# Patient Record
Sex: Male | Born: 1975
Health system: Southern US, Community
[De-identification: ages and names within clinical notes are randomized; demographics above are authoritative.]

## PROBLEM LIST (undated history)

## (undated) DIAGNOSIS — I10 Essential (primary) hypertension: Secondary | ICD-10-CM

## (undated) DIAGNOSIS — E119 Type 2 diabetes mellitus without complications: Secondary | ICD-10-CM

## (undated) DIAGNOSIS — E785 Hyperlipidemia, unspecified: Secondary | ICD-10-CM

## (undated) HISTORY — DX: Type 2 diabetes mellitus without complications: E11.9

## (undated) HISTORY — PX: APPENDECTOMY: SHX54

---

## 2000-08-27 ENCOUNTER — Encounter: Payer: Self-pay | Admitting: Emergency Medicine

## 2000-08-27 ENCOUNTER — Emergency Department (HOSPITAL_COMMUNITY): Admission: EM | Admit: 2000-08-27 | Discharge: 2000-08-27 | Payer: Self-pay | Admitting: Emergency Medicine

## 2004-08-17 ENCOUNTER — Ambulatory Visit: Payer: Self-pay | Admitting: Family Medicine

## 2004-08-31 ENCOUNTER — Ambulatory Visit: Payer: Self-pay | Admitting: Family Medicine

## 2004-11-14 ENCOUNTER — Emergency Department (HOSPITAL_COMMUNITY): Admission: EM | Admit: 2004-11-14 | Discharge: 2004-11-14 | Payer: Self-pay | Admitting: Family Medicine

## 2008-02-20 ENCOUNTER — Ambulatory Visit (HOSPITAL_COMMUNITY): Admission: EM | Admit: 2008-02-20 | Discharge: 2008-02-25 | Payer: Self-pay | Admitting: Emergency Medicine

## 2008-02-20 ENCOUNTER — Ambulatory Visit: Payer: Self-pay | Admitting: Internal Medicine

## 2008-02-20 ENCOUNTER — Encounter (INDEPENDENT_AMBULATORY_CARE_PROVIDER_SITE_OTHER): Payer: Self-pay | Admitting: General Surgery

## 2008-02-29 ENCOUNTER — Inpatient Hospital Stay (HOSPITAL_COMMUNITY): Admission: EM | Admit: 2008-02-29 | Discharge: 2008-03-07 | Payer: Self-pay | Admitting: Emergency Medicine

## 2010-02-24 ENCOUNTER — Emergency Department (HOSPITAL_COMMUNITY): Admission: EM | Admit: 2010-02-24 | Discharge: 2010-02-24 | Payer: Self-pay | Admitting: Emergency Medicine

## 2011-02-08 NOTE — H&P (Signed)
NAMEKyriakos, Babler Braylan               ACCOUNT NO.:  [id number]   MEDICAL RECORD NO.:  [id number]          PATIENT TYPE:  INP   LOCATION:  5120                         FACILITY:  [facility]   PHYSICIAN:  Angelia Mould. Derrell Lolling, M.D.DATE OF BIRTH:  1976-05-16   DATE OF ADMISSION:  02/29/2008  DATE OF DISCHARGE:                              HISTORY & PHYSICAL   OPERATIVE PHYSICIAN:  Ollen Gross. Vernell Morgans, MD   CHIEF COMPLAINT:  Back pain and rectal pain.   HISTORY OF PRESENT ILLNESS:  Mr. Smisek is a 35 year old male patient  status post appendectomy for perforated appendicitis on Feb 19, 2008.  He was recently discharged this past Monday on February 25, 2008.  His  postoperative course was complicated by nausea, vomiting, and ileus  symptoms.  On the day of discharge, the patient that he had symptoms  consistent with mild fecal urgency, but was not severe, but after  arriving home within 24 hours he began having increasing of rectal  pressure which evolved into back pain and right lower quadrant pain that  became progressive overtime.  He will have persistent fecal urgency and  despite the attempts to have a bowel movement, would only pass flatus.  As the pain increased, he opted to present to the ER and presented  during the night where workup revealed a leukocytosis of 27,000 with a  left shift, significant pain in the right lower quadrant and in the  back.  CT scan was completed that demonstrates phlegmon at the ileocecal  region, a large abscess between the bladder and the rectum, as well as a  satellite lesion extending out into laterally on the right from this  larger abscess.  Surgical admission has been requested.   REVIEW OF SYSTEMS:  As per the history present illness.  The patient  reports since discharge, he has been unable to sleep because of severe  pain despite the use of pain medicines.  He has had very poor intake and  small p.o. intake.  He is eating things such as small amounts of  pudding  and applesauce.  He has had no nausea and vomiting though.   PAST MEDICAL HISTORY:  1. Diabetes.  2. Hypertension.  3. Obesity.   PAST SURGICAL HISTORY:  Laparoscopic appendectomy as noted.   FAMILY HISTORY:  Noncontributory at this admission.   SOCIAL HISTORY:  He is married.  His wife is at bedside with him.  He  does not use tobacco products.  He uses alcohol on a rare social basis  only.   DRUG ALLERGIES:  NKDA.   CURRENT MEDICATIONS:  1. Cipro 500 mg b.i.d.  2. Flagyl 500 mg t.i.d.  3. Glyburide 2.5 mg 1/2 tablet daily.  4. Vicodin 5/325 mg 1-2 every 4-6 hours as need for pain.  5. Lisinopril 10 mg daily.  6. Metformin 5 mg b.i.d.  7. Simvastatin 40 mg daily.   PHYSICAL EXAMINATION:  GENERAL:  A pleasant male patient reporting  severe back pain and rectal pain causing inability to sleep.  A pleasant  male patient appearing stated age, in no acute distress  VITAL SIGNS:  Temperature 97.5; BP is 133/78; pulse was 114 upon  presentation, down to 88 after pain medications; respirations 20.  HEENT:  Head is normocephalic.  Sclerae noninjected.  NECK:  Supple.  No adenopathy.  CHEST:  Bilateral lung sounds, clear to auscultation.  Respiratory  effort is nonlabored.  He is on room air.  CARDIOVASCULAR:  Heart sounds are S1 and S2 without rubs, murmurs, or  gallops.  Prior tachycardia is resolved.  No JVD.  No peripheral edema.  ABDOMEN:  Obese, but soft.  He is tender in the right lower quadrant  without guarding or rebounding.  Minimal flank pain.  Bowel sounds are  present.  Prior surgical incisions from laparoscopic procedure are  healing well.  The Dermabond over the skin is crackling.  GENITOURINARY:  External genitalia is grossly normal.  Scrotal sac is  normal without any evidence of edema.  EXTREMITIES:  Symmetrical in appearance without cyanosis or clubbing.  SKIN:  As noted on abdominal exam regarding surgical incisions.  NEUROLOGIC:  The patient is  moving all extremities x4.  Cranial nerves  II-XII are grossly intact.  DTRs and Babinski reflexes were not checked.  Gait was not assessed.  PSYCH:  The patient is alert and oriented x4 and affect is appropriate  to current situation.   LAB AND X-RAYS:  Sodium 133, potassium 3.0, glucose 295, BUN 5,  creatinine 0.9, and CO2 of 25.  White count 27,200 with neutrophils 90%,  hemoglobin 14.2, and platelets 537,000.  Urinalysis is negative.  CT of  the abdomen and pelvis again demonstrates a phlegmonous process at the  ileocecal region.  He has a 7.1 x 7.7-cm abscess between the bladder and  the rectum.  He has a small satellite lesion anterior on the right to  this larger abscess.   IMPRESSION:  1. Postoperative pelvic abscesses.  2. Recent perforated appendicitis, status post laparoscopic      appendectomy.  3. Diabetes, uncontrolled with associated electrolyte imbalances.  4. Volume depletion.   PLAN:  1. Admit the patient to general surgical floor inpatient status.  2. Empirical Invanz.  Stop Cipro and Flagyl.  3. Consult interventional radiology for percutaneous drainage of      abscesses.  We would also like aspiration and culture as well of      the phlegmonous process in the right lower quadrant.  There were no      complications in regarding control of the appendiceal stump per Dr.      Billey Chang note, but giving we will need to keep an eye on this to make      sure there is no fistula or anastomotic leak.  We will treat      symptoms with IV Dilaudid for pain and Percocet when taking oral      medications.  We will go ahead and give Toradol and give PPI.  The      patient did have hematemesis, coffee ground-type emesis during the      last admission, but this was due to trauma from repeated emesis      episodes.  If these symptoms will occur, we will have to stop the      Toradol.  4. IV fluid hydration with normal saline and potassium.  The patient's      sodium and  potassium are abnormal, but this is because of      hyperglycemia.  The patient can take anything by mouth after he is  evaluated interventional radiology.  We will give him repletion of      oral potassium 40 mEq x2 doses.  5. Regarding the patient's diabetes, we will hold his metformin.      Since he has had CT contrast, we will go ahead and place him on the      glycemic control standing orders.  Appropriate orders have been      checked, but he is still      on his weight and current diet status.  We will go ahead and check      a hemoglobin A1c as well.  6. After percutaneous procedure is completed, we will start the      patient on prophylactic Lovenox for DVT prophylaxis issues.      Allison L. Rennis Harding, N.P.      Angelia Mould. Derrell Lolling, M.D.  Electronically Signed    ALE/MEDQ  D:  02/29/2008  T:  02/29/2008  Job:  [id number]   cc:   Ollen Gross. Vernell Morgans, M.D.

## 2011-02-08 NOTE — Discharge Summary (Signed)
NAMERaman, Featherston Mahin               ACCOUNT NO.:  [id number]   MEDICAL RECORD NO.:  [id number]          PATIENT TYPE:  OIB   LOCATION:  5150                         FACILITY:  [facility]   PHYSICIAN:  Angelia Mould. Derrell Lolling, M.D.DATE OF BIRTH:  December 23, 1975   DATE OF ADMISSION:  02/19/2008  DATE OF DISCHARGE:  02/25/2008                               DISCHARGE SUMMARY   DISCHARGING PHYSICIAN:  Angelia Mould. Derrell Lolling, MD   SURGEON:  Ollen Gross. Vernell Morgans, MD   PROCEDURE:  Laparoscopic appendectomy done by Dr. Carolynne Edouard on Feb 20, 2008.   CONSULTANTS:  Valerie A. Felicity Coyer, MD with [facility].   REASON FOR ADMISSION:  Mr. Hinton was a 35 year old black male, who  presented to the emergency department with right lower quadrant  abdominal pain on Saturday.  His pain was associated with significant  nausea and vomiting.  At this time, he did not have any fevers; however,  his pain has been continuously worsening.  At this time, a CT scan was  done which showed acute appendicitis.  Please see admission history and  physical for further details.   ADMITTING DIAGNOSIS:  Acute appendicitis.   HOSPITAL COURSE:  The patient was admitted and immediately taken to the  OR for a laparoscopic appendectomy to be done.  While in the OR, the  patient was found to have a perforated appendix.  After surgery, the  patient went to PACU in stable condition.  After surgery, he was also  placed on Zosyn IV q.6 h.  On hospital day #1-1/2, the patient was  afebrile and his vital signs were stable except for he is slightly  tachycardiac at 125.  There were currently no labs at this time.  The  patient was complaining of some pain but was well controlled with his  medicines.  He also states that he drank some juice this morning,  however, that was all he could handle.  At this time, he was not passing  any flatus.  At this time, he was continued on clear liquids and  informed to began ambulating.  On postoperative day  #1, the patient was  continued to be afebrile; however, he was still tachycardiac and his  blood pressure had increased to 150/90.  At this time, his white blood  cell count had decreased from 20,000-18,800.  At this time, the patient  was not feeling well and was having persistent bilious emesis.  At this  time, Phenergan, Zofran, as well as Reglan did not seem to help his  nausea.  At this time, Compazine was ordered as well to help with his  nausea and vomiting.  Therefore at this time, the patient had NG tube  placed for which he received back approximately 1000 mL as soon as it  was placed.  Otherwise, a 2-view abdominal x-ray was obtained which  ended up showing a significant ileus.  The patient was also started on  Protonix.  By postoperative day #2, the patient was beginning to feel  somewhat better.  He was now starting to pass flatus; however, his  abdomen was still  slightly distended.  At this time, his incisions were  stable and clean, dry, and intact.  His NG tube was continued.  By  postoperative day #3, the patient was passing flatus and having multiple  bowel movements.  At this time, his NG tube was discontinued and he was  started on popsicles.  On postoperative day #4, the patient was  continued to feel better.  His abdomen was soft and continuing to have  loose bowel movements.  At this time, his diet was advanced to a soft  diet and his stools were sent to check for Clostridium difficile.  His  antibiotics were also continued at this time.  By postoperative day #5,  the patient was tolerating a soft diet well.  He was not complaining of  any pain.  His abdomen was completely benign and his incisions were  still clean, dry, and intact.  At this time, we do not have any of the  initial C. diff report; however, we feel that the patient is probably at  this time stable for discharge anyway.   On postoperative day #1-1/2, it was noted that the patient was beginning  to  have very high blood glucoses.  One was checked that was 272.  At  this time, orders were given to check CBG, CID a.c. and h.s. and then to  begin sliding scale insulin.  By postoperative day #1, the patient's  blood glucose was still elevated at 285, and he was beginning to have  quite a bit of hypertension at 150/90.  Therefore at this time, internal  medicine consult was asked for from [facility].  The patient had seen Dr.  Carolynne Edouard several years in the past.  At this time, they had written several  recommendations once the patient was beginning to take p.o. foods.  They  also requested  a nutrition consult to educate the patient on diabetic  diet and added h.s. coverage for his sliding scale insulin.  The patient  at this time was also started on a Catapres patch to help control his  blood pressure.  Internal medicine also made recommendations to start  the patient on lisinopril once he was taking p.o.'s as well.  His lipid  panel was also checked for which the patient was started on Zocor as  well.  Over the next several days, the patient's blood pressure was  somewhat better controlled and had come down to 130/78 on postoperative  day #3.  Throughout the rest of his hospital stay, his CBGs continued to  be elevated; however, quite a bit of education as well as multiple  prescriptions were written and close followup was already arranged for  the patient once he was discharged.   DISCHARGE DIAGNOSES:  1. Acute perforated appendicitis.  2. Status post laparoscopic appendectomy.  3. New-onset type 2 diabetes.  4. New-onset hypertension.  5. New-onset hyperlipidemia.   DISCHARGE MEDICATIONS:  1. Lisinopril 10 mg once daily.  2. Metformin 500 mg twice daily.  3. Simvastatin 40 mg once daily.  4. Glyburide 2.5 mg half a tablet twice daily.  5. Aspirin 81 mg once daily.  6. Cipro 500 mg b.i.d. for 5 days.  7. Flagyl 500 mg t.i.d. x5 days.  8. Vicodin 5/325 one tablet every 4-6 hours as  needed for pain.   DISCHARGE INSTRUCTIONS:  The patient was told that he may return to work  with in one week.  For the next week, he is not to lift anything  greater  than approximately 15 pounds; however, once he gets back to work he is  to let pain be his guide as far as how much he lifts.  Otherwise, he is  to increase his activity slowly and he may shower.  At this time, he is  to follow a diabetic diet which was explained to him by his nutritionist  in the hospital.  He is to call our office if he begins to get any  fevers greater than 101.5, new or severe abdominal pain, or redness or  pus-like drainage from his incisions.  Also from recommendations from  internal medicine, he is to make an appointment for an eye exam if he  was not done this within the last year.  Otherwise, he is to check his  blood sugars twice daily and record the time and the blood sugar number  in notebook.  He is informed that if his glucose is less than 80 that he  may take a glass of juice or a snack.  He is to repeat this in 30  minutes and greater than 350 then he is  to call Dr. Carolynne Edouard.  Otherwise, the patient is informed that he needs to  follow up with Dr. Carolynne Edouard on Friday, February 29, 2008, at 2:30 p.m..  He is  also to follow up with Dr. Carolynne Edouard in our office in 7-10 days, for which he  is to call for that appointment.      Letha Cape, PA      [place]. Derrell Lolling, M.D.  Electronically Signed    KEO/MEDQ  D:  02/25/2008  T:  02/25/2008  Job:  [id number]   cc:   Tinnie Gens A. Tawanna Cooler, MD  Ollen Gross. Vernell Morgans, M.D.

## 2011-02-08 NOTE — H&P (Signed)
NAMEKi, [name]               ACCOUNT NO.:  [id number]   MEDICAL RECORD NO.:  [id number]          PATIENT TYPE:  INP   LOCATION:  5128                         FACILITY:  [facility]   PHYSICIAN:  Ollen Gross. Vernell Morgans, M.D. DATE OF BIRTH:  December 10, 1975   DATE OF ADMISSION:  02/19/2008  DATE OF DISCHARGE:                              HISTORY & PHYSICAL   HISTORY OF PRESENT ILLNESS:  Mr. Dakota Becker is a 35 year old black male  who presents to the emergency department with right lower quadrant pain  since Saturday.  Pain has been associated with significant nausea and  vomiting.  He denies any fevers, pain has been worsening since Saturday  as well.  He otherwise denies any chest pain, shortness of breath,  diarrhea, dysuria, and the rest was reviewed.  Rest of his review of  systems is unremarkable.   PAST MEDICAL HISTORY:  None.   PAST SURGICAL HISTORY:  None.   MEDICATIONS:  None.   ALLERGIES:  None.   SOCIAL HISTORY:  He denies any tobacco use, only occasionally drinks an  alcohol.   FAMILY HISTORY:  Noncontributory.   PHYSICAL EXAMINATION:  VITALS:  Temperature 97.9, blood pressure 136/83,  and pulse 100.  GENERAL:  He is a well-developed, well-nourished black male, in no acute  distress.  SKIN:  Warm and dry without jaundice.  EYES:  Extraocular muscles were intact.  Pupils are equal, round and  reactive to light.  Sclerae nonicteric.  LUNGS:  Clear bilaterally.  No use of accessory muscles.  HEART:  Regular rate and rhythm with impulse in left chest.  ABDOMEN:  Soft, but significantly tender in the right lower quadrant  with guarding.  No palpable mass or hepatosplenomegaly.  EXTREMITIES:  No cyanosis, clubbing, or edema.  Good strength in his  arms and legs.  PSYCHOLOGIC:  He is alert and oriented x3 with no evidence of anxiety or  depression.   LABORATORY DATA:  On review of his lab work, which is significant for  white count of 20,000.  His CT scan was reviewed with the  radiologist  and did show an enlarged inflamed appendix consistent with appendicitis.   ASSESSMENT AND PLAN:  This is a 35 year old black male with what appears  to be acute appendicitis.  Because of the risk of perforation of the  sepsis, I think he would benefit from having his appendix removed.  I  have explained him in detail the risks and benefits of the operation to  remove the appendix as well as some of the technical aspects.  He  understands and wishes to proceed.  We will obtain some routine lab work  preparation during this one night.      Ollen Gross. Vernell Morgans, M.D.  Electronically Signed     PST/MEDQ  D:  02/19/2008  T:  02/20/2008  Job:  [id number]

## 2011-02-08 NOTE — Discharge Summary (Signed)
NAMENasser, Ku Harvard               ACCOUNT NO.:  [id number]   MEDICAL RECORD NO.:  [id number]          PATIENT TYPE:  INP   LOCATION:  5120                         FACILITY:  [facility]   PHYSICIAN:  Sandria Bales. Ezzard Standing, M.D.  DATE OF BIRTH:  09-04-1976   DATE OF ADMISSION:  02/29/2008  DATE OF DISCHARGE:  03/07/2008                               DISCHARGE SUMMARY   Dates of admission and discharge ??   ADMITTING PHYSICIAN:  Angelia Mould. Derrell Lolling, MD   DISCHARGING PHYSICIAN:  Sandria Bales. Ezzard Standing, MD   OPERATING SURGEON LAST ADMISSION:  Ollen Gross. Vernell Morgans, MD   PROCEDURE:  1. Last admission was laparoscopic appendectomy.  2. This admission, percutaneous drainage of intra-abdominal abscess on      February 29, 2008.   CONSULTATIONS:  None.   REASON FOR ADMISSION:  Mr. Pruden is a 35 year old male who presents  status post laparoscopic appendectomy for perforated appendix on Feb 19, 2008.  He was discharged from the hospital on Monday, June 1, following  his procedure.  However, once the patient got home, he began having  increasing rectal pressure and pain in his back as well as his right  lower quadrant.  This continued for several days.  He then presented to  the ER where he had a CT scan where he was found to have a large abscess  between the bladder and the rectum with several satellite lesions  extending from this larger abscess.  When the patient arrived, he did  have a white count of 27,000.  Please see admitting H&P for further  details.   ADMITTING DIAGNOSES:  1. Postoperative pelvic abscesses.  2. Recent perforated appendicitis, status post laparoscopic      appendectomy.  3. Diabetes, just recently diagnosed.  4. Hypertension.  5. Volume depletion.   HOSPITAL COURSE:  On the date of admission, the patient was taken down  for a CT-guided placement of a pelvic abscess drainage by interventional  radiology.  The patient tolerated this procedure well.  He did get 150  mL of purulent  drainage back from the initial placement of the drain.   On hospital day #2, the patient was feeling much better and his back  pain had basically resolved.  At this point, he had no nausea or  vomiting.  At this time, his activity was increased and he was started  on a regular diet.  On hospital day #2, the patient tolerated 100% of  his breakfast and was continuing to feel well.  A repeat CBC revealed  that his WBCs had decreased to approximately 15,000.  At this time, he  was continued on his IV Invanz.  Over the next several days, the patient  was continued with his percutaneous drain.  It was had been draining  some tan brown creamy drainage with a somewhat foul odor, however, by  hospital day #4, the drainage had changed to a more serosanguineous  color with no odor.  However, we felt that at this time his drainage  output had decreased enough to go ahead with a CT scan the  following day  to reassess this abscess.   At this time, the patient's CT scan revealed a complete resolution of  the fluid collection between the rectum and bladder, however, it was  felt that there was a small fistula to the rectum.  However, they  thought it was safe to go ahead and pull the drain because they felt  that this would heal up fairly quickly without any problems because of  the position and location that the small infill to the rectum was  located.  However, because of this, the patient was placed on clear  liquid diet and a repeat CBC was drawn.  At this time, a WBC revealed  8600.  The patient was tolerating clear liquids well without any  increase in abdominal pain.  He did state that he had a bowel movement  with a slight twinge of pain, however, he did not have any blood in his  stool or any other problems.  At this time, he was continued on clear  liquids for another 24 hours and another CBC was checked.  On the last  hospital day, the patient's WBC was 7400.  He was doing well and   tolerating clear liquid diet with no further abdominal pain.  At this  time, it was felt the patient was stable for discharge.   DISCHARGE DIAGNOSES:  1. Postoperative pelvic abscess, which have resolved.  2. Status post percutaneous drainage of pelvic abscess.  3. Status post laparoscopic appendectomy due to a ruptured appendix.  4. Diabetes.  5. Hypertension.   DISCHARGE MEDICATIONS:  The patient is informed that he may resume all  home medications for which I do not have a list currently at this time,  because the nurse is explaining his discharge instructions to him and  she has all of his medication list with her.  However, he is getting new  prescriptions for Cipro 500 mg 1 p.o. b.i.d. and Flagyl 500 mg 1 p.o.  t.i.d. for the next 3 days to complete a full 10-day course of  antibiotics since presentation last Friday.   DISCHARGE INSTRUCTIONS:  At this time, the patient is informed that he  may return to work in 1 week based on his request.  He is also informed that he may increase his activity slowly.  He may walk up steps.  He is informed that at this point in time his  lifting restriction is based upon his pain tolerance since his incisions  are well-healed.  Otherwise, he is told to continue clears from mostly today, however, he  is to advance his diet slowly over the next several days.  He is informed to call our office if he begins to get a fever of greater  than 101.5 or any new or severe abdominal pain.  He is also informed that he needs to call Dr. Tawanna Cooler again to reschedule  another followup appointment for his diabetes as well as his  hypertension.  Otherwise, he is to return to see Dr. Carolynne Edouard in the next 2-3 weeks for  followup from our office.      Letha Cape, PA      Sandria Bales. Ezzard Standing, M.D.  Electronically Signed    KEO/MEDQ  D:  03/07/2008  T:  03/07/2008  Job:  [id number]   cc:   Ollen Gross. Vernell Morgans, M.D.  Jeffrey A. Tawanna Cooler, MD

## 2011-02-08 NOTE — Op Note (Signed)
Dakota Becker, Dakota Becker               ACCOUNT NO.:  [id number]   MEDICAL RECORD NO.:  [id number]          PATIENT TYPE:  OIB   LOCATION:  5128                         FACILITY:  [facility]   PHYSICIAN:  Ollen Gross. Vernell Morgans, M.D. DATE OF BIRTH:  Aug 28, 1976   DATE OF PROCEDURE:  02/20/2008  DATE OF DISCHARGE:                               OPERATIVE REPORT   PREOPERATIVE DIAGNOSIS:  Appendicitis.   POSTOPERATIVE DIAGNOSIS:  Perforated appendicitis.   PROCEDURE:  Laparoscopic appendectomy.   SURGEON:  Ollen Gross. Vernell Morgans, MD   ANESTHESIA:  General endotracheal.   PROCEDURE IN DETAIL:  After informed consent was obtained, the patient  was brought to the operating room and placed in supine position on the  operating table.  After adequate induction of general anesthesia, the  patient's abdomen was prepped with Betadine and draped in usual sterile  manner.  The area above the umbilicus was infiltrated with 0.25%  Marcaine.  A small incision was made with a 15-blade knife.  This  incision was carried down through the subcutaneous tissue bluntly with a  hemostat and [employer] retractors until the linea alba was identified.  The linea alba was incised with a 15-blade knife and each side was  grasped with Kocher clamps and elevated anteriorly.  The preperitoneal  space was then probed bluntly with a hemostat until the peritoneum was  opened and access was gained into the abdominal cavity.  A 0 Vicryl  pursestring stitch was placed in the fascia around the opening.  A  Hasson cannula was placed through the opening and anchored in place at  the previous with 0 Vicryl pursestring stitch.  The abdomen was then  insufflated with carbon dioxide without difficulty.  A laparoscope was  inserted through the Hasson cannula and the abdomen was inspected.  There was a lot of pus throughout the abdomen and a lot of adhesions of  interloop intestine.  The patient was placed in Trendelenburg and  rotated with the  right side up.  Next, the suprapubic region was  infiltrated with 0.25% Marcaine.  A small incision was made with a 15-  blade knife and a 5-mm port was placed bluntly through this incision  into the abdominal cavity under direct vision.  A site was then chosen  above the other 2 ports for placement of another third port.  This area  was infiltrated with 0.25% Marcaine.  A small incision was made with a  15-blade knife and a 5-mm port was placed bluntly through this incision  into the abdominal cavity under direct vision.  The right lower quadrant  was then able to be examined.  The cecum was identified.  We were able  to identify the takeoff of the appendix which then coursed down into the  pelvis.  The appendix was very stuck with a lot of inflammation around  it.  The sigmoid colon was also adherent to the appendix.  The appendix  along its midportion appeared to have a necrotic area that was  perforated.  The base of the appendix though at this junction with the  cecum was healthy.  We were able, using blunt dissection, to open the  mesoappendix at the base of the appendix where it joins with the cecum.  Once we were able to open this point up, we were able to get the  laparoscopic blue load 6-row 45-mm stapler across the base of the  appendix.  At this point, clamped and fired thereby dividing the base of  the appendix between staple lines.  Once we were able to do this, we  were able to bluntly separate the appendix from the sigmoid colon and  then bluntly bring the appendix up out of the pelvis.  More of the  mesoappendix was divided sharply with the harmonic scalpel.  Once this  was all complete, the appendix was finally free. At that point, a  laparoscopic bag was inserted through the Hasson cannula.  The appendix  was placed in the bag and the bag was sealed.  The abdomen was then  irrigated in all quadrants with copious amounts of saline and as many of  the interloop connections  were broken up by blunt dissection as possible  until the effluent in the belly was clear.  Once this was accomplished,  the staple line appeared to be healthy and intact and hemostatic.  No  other abnormalities were noted.  The appendix was then removed in the  bag through the supraumbilical port without difficulty.  The fascial  defect was closed with previous placed 0 Vicryl pursestring stitch as  well as with another figure-of-eight 0 Vicryl stitch.  The rest of the  ports were removed under direct vision.  The gas was allowed to escape.  The skin incisions were all closed with interrupted 4-0 Monocryl  subcuticular stitches.  Dermabond dressings were applied.  The patient  tolerated the procedure well.  At the end of the case, all needle,  sponge, and instrument counts were correct.  The patient was then  awakened and taken to recovery room in stable condition.      Ollen Gross. Vernell Morgans, M.D.  Electronically Signed     PST/MEDQ  D:  02/21/2008  T:  02/21/2008  Job:  [id number]

## 2011-06-22 LAB — URINALYSIS, ROUTINE W REFLEX MICROSCOPIC
Hgb urine dipstick: NEGATIVE
Ketones, ur: 40 — AB
Ketones, ur: >80 — AB
Leukocytes, UA: NEGATIVE
Nitrite: NEGATIVE
Nitrite: NEGATIVE
Protein, ur: 100 — AB
Urobilinogen, UA: 1
Urobilinogen, UA: 4 — ABNORMAL HIGH
pH: 5

## 2011-06-22 LAB — DIFFERENTIAL
Basophils Absolute: 0
Basophils Absolute: 0.1
Basophils Relative: 0
Basophils Relative: 0
Eosinophils Absolute: 0
Eosinophils Absolute: 0.1
Eosinophils Relative: 0
Monocytes Absolute: 1.4 — ABNORMAL HIGH
Monocytes Relative: 9
Neutro Abs: 10.6 — ABNORMAL HIGH
Neutrophils Relative %: 78 — ABNORMAL HIGH

## 2011-06-22 LAB — CLOSTRIDIUM DIFFICILE EIA: C difficile Toxins A+B, EIA: NEGATIVE

## 2011-06-22 LAB — COMPREHENSIVE METABOLIC PANEL
ALT: 22
AST: 16
Alkaline Phosphatase: 58
CO2: 21
Chloride: 101
Creatinine, Ser: 0.92
GFR calc Af Amer: >60
GFR calc non Af Amer: >60
Potassium: 3.1 — ABNORMAL LOW
Sodium: 137
Total Bilirubin: 1.6 — ABNORMAL HIGH

## 2011-06-22 LAB — CBC
Hemoglobin: 16.3
MCHC: 33.9
MCHC: 34
MCV: 86.4
MCV: 87.7
MCV: 88.3
Platelets: 339
RBC: 4.37
RBC: 4.92
RBC: 5.45
RDW: 12.8
WBC: 18.8 — ABNORMAL HIGH
WBC: 20.2 — ABNORMAL HIGH

## 2011-06-22 LAB — LIPID PANEL
Cholesterol: 231 — ABNORMAL HIGH
HDL: 16 — ABNORMAL LOW
LDL Cholesterol: 160 — ABNORMAL HIGH
Total CHOL/HDL Ratio: 14.4
Triglycerides: 273 — ABNORMAL HIGH

## 2011-06-22 LAB — BASIC METABOLIC PANEL
CO2: 21
Calcium: 9.8
Chloride: 101
Creatinine, Ser: 0.85
GFR calc Af Amer: >60
Sodium: 135

## 2011-06-22 LAB — URINE MICROSCOPIC-ADD ON

## 2011-06-22 LAB — URINE CULTURE
Culture: NO GROWTH
Special Requests: NEGATIVE

## 2011-06-22 LAB — LIPASE, BLOOD: Lipase: 14

## 2011-06-22 LAB — HEMOGLOBIN A1C: Mean Plasma Glucose: 289

## 2011-06-23 LAB — CBC
HCT: 36.7 — ABNORMAL LOW
HCT: 40.6
Hemoglobin: 12.3 — ABNORMAL LOW
Hemoglobin: 12.7 — ABNORMAL LOW
Hemoglobin: 13.1
Hemoglobin: 14
Hemoglobin: 14.2
MCHC: 33.8
MCHC: 33.9
MCHC: 34.6
MCV: 87.3
MCV: 87.5
MCV: 87.8
RBC: 4.14 — ABNORMAL LOW
RBC: 4.2 — ABNORMAL LOW
RBC: 4.44
RBC: 4.7
RDW: 12.8
RDW: 12.8
WBC: 15.9 — ABNORMAL HIGH
WBC: 8.6

## 2011-06-23 LAB — URINALYSIS, ROUTINE W REFLEX MICROSCOPIC
Bilirubin Urine: NEGATIVE
Glucose, UA: >1000 — AB
Hgb urine dipstick: NEGATIVE
Ketones, ur: 40 — AB
pH: 5.5

## 2011-06-23 LAB — DIFFERENTIAL
Basophils Absolute: 0
Basophils Absolute: 0
Basophils Relative: 0
Basophils Relative: 0
Eosinophils Absolute: 0
Eosinophils Absolute: 0.1
Eosinophils Relative: 0
Lymphs Abs: 1.1
Lymphs Abs: 1.4
Lymphs Abs: 2.4
Monocytes Absolute: 0.7
Monocytes Relative: 5
Monocytes Relative: 8
Neutro Abs: 24.4 — ABNORMAL HIGH
Neutrophils Relative %: 90 — ABNORMAL HIGH

## 2011-06-23 LAB — POCT I-STAT, CHEM 8
BUN: 5 — ABNORMAL LOW
Calcium, Ion: 1.12
Chloride: 97
Glucose, Bld: 295 — ABNORMAL HIGH

## 2011-06-23 LAB — COMPREHENSIVE METABOLIC PANEL
ALT: 20
CO2: 25
Calcium: 9
Chloride: 101
GFR calc non Af Amer: >60
Glucose, Bld: 256 — ABNORMAL HIGH
Sodium: 135
Total Bilirubin: 0.7

## 2011-06-23 LAB — HEMOGLOBIN A1C
Hgb A1c MFr Bld: 10.4 — ABNORMAL HIGH
Mean Plasma Glucose: 293

## 2011-06-23 LAB — BASIC METABOLIC PANEL
CO2: 25
Calcium: 8.6
Creatinine, Ser: 0.74
GFR calc Af Amer: >60
GFR calc non Af Amer: >60
Sodium: 139

## 2011-06-23 LAB — CULTURE, ROUTINE-ABSCESS

## 2011-06-23 LAB — URINE MICROSCOPIC-ADD ON

## 2011-06-23 LAB — CLOSTRIDIUM DIFFICILE EIA

## 2012-04-10 ENCOUNTER — Ambulatory Visit
Admission: RE | Admit: 2012-04-10 | Discharge: 2012-04-10 | Disposition: A | Discharge status: Home / Self Care | Payer: Self-pay | Source: Ambulatory Visit | Attending: Family Medicine | Admitting: Family Medicine

## 2012-04-10 ENCOUNTER — Other Ambulatory Visit: Payer: Self-pay | Admitting: Family Medicine

## 2012-04-10 DIAGNOSIS — R52 Pain, unspecified: Secondary | ICD-10-CM

## 2014-04-21 ENCOUNTER — Ambulatory Visit (INDEPENDENT_AMBULATORY_CARE_PROVIDER_SITE_OTHER): Payer: BC Managed Care – PPO | Admitting: Internal Medicine

## 2014-04-21 ENCOUNTER — Ambulatory Visit (INDEPENDENT_AMBULATORY_CARE_PROVIDER_SITE_OTHER): Payer: BC Managed Care – PPO

## 2014-04-21 VITALS — BP 122/90 | HR 112 | Temp 98.2°F | Resp 18 | Ht 70.75 in | Wt 262.6 lb

## 2014-04-21 DIAGNOSIS — M25561 Pain in right knee: Secondary | ICD-10-CM

## 2014-04-21 DIAGNOSIS — M25569 Pain in unspecified knee: Secondary | ICD-10-CM

## 2014-04-21 DIAGNOSIS — M7051 Other bursitis of knee, right knee: Secondary | ICD-10-CM

## 2014-04-21 DIAGNOSIS — M76899 Other specified enthesopathies of unspecified lower limb, excluding foot: Secondary | ICD-10-CM

## 2014-04-21 LAB — POCT CBC
GRANULOCYTE PERCENT: 64.7 % (ref 37–80)
HCT, POC: 49.8 % (ref 43.5–53.7)
Hemoglobin: 16.4 g/dL (ref 14.1–18.1)
Lymph, poc: 2.7 (ref 0.6–3.4)
MCH: 28.7 pg (ref 27–31.2)
MCHC: 32.9 g/dL (ref 31.8–35.4)
MCV: 87.2 fL (ref 80–97)
MID (CBC): 0.2 (ref 0–0.9)
MPV: 8.2 fL (ref 0–99.8)
PLATELET COUNT, POC: 378 10*3/uL (ref 142–424)
POC Granulocyte: 5.2 (ref 2–6.9)
POC LYMPH %: 33.3 % (ref 10–50)
POC MID %: 2 %M (ref 0–12)
RBC: 5.71 M/uL (ref 4.69–6.13)
RDW, POC: 128 %
WBC: 8.1 10*3/uL (ref 4.6–10.2)

## 2014-04-21 LAB — POCT SEDIMENTATION RATE: POCT SED RATE: 7 mm/hr (ref 0–22)

## 2014-04-21 MED ORDER — IBUPROFEN 800 MG PO TABS: 800.0000 mg | ORAL_TABLET | Freq: Three times a day (TID) | ORAL | Status: DC | PRN

## 2014-04-21 NOTE — Progress Notes (Signed)
   Subjective:    Patient ID: Dakota Becker, male    DOB: [birth year], 38 y.o.   MRN: [id number]  HPI A 38 year old male is c/o of right knee pain for 3 days from bowling. Pt States it is an throbbing pain and movement is tough. He states his pain is located medial and laterally around the patella.  Pt states he is taking OTC medication of Aleve and Ibuprofen with no relief. He is also denies use of hot or cold compress.  Pt states his knee has been hot to the touch and has weight bearing pain with minor swelling. Denies fever and no prior injuries to his knee. Positive family hx of gout  Review of Systems     Objective:   Physical Exam  Constitutional: He is oriented to person, place, and time. He appears well-developed and well-nourished. No distress.  HENT:  Head: Normocephalic.  Eyes: EOM are normal. Pupils are equal, round, and reactive to light.  Neck: Normal range of motion.  Pulmonary/Chest: Effort normal.  Musculoskeletal: He exhibits tenderness.       Right knee: He exhibits swelling and erythema. He exhibits normal range of motion, no effusion, no ecchymosis, no deformity, no laceration, normal alignment, no LCL laxity, normal patellar mobility, no bony tenderness, normal meniscus and no MCL laxity. Tenderness found. Medial joint line tenderness noted. No lateral joint line, no MCL, no LCL and no patellar tendon tenderness noted.       Legs: Warm and point tender and slitely puffy at this spot.  Painful full extention and full flexion No joint effusion.  Neurological: He is alert and oriented to person, place, and time. He exhibits normal muscle tone. Coordination normal.  Skin: There is erythema.  Psychiatric: He has a normal mood and affect.    [facility] reading (PRIMARY) by  Dr.Guest.normal knee xr  Results for orders placed in visit on 04/21/14  POCT CBC      Result Value Ref Range   WBC 8.1  4.6 - 10.2 K/uL   Lymph, poc 2.7  0.6 - 3.4   POC LYMPH PERCENT 33.3  10 - 50  %L   MID (cbc) 0.2  0 - 0.9   POC MID % 2.0  0 - 12 %M   POC Granulocyte 5.2  2 - 6.9   Granulocyte percent 64.7  37 - 80 %G   RBC 5.71  4.69 - 6.13 M/uL   Hemoglobin 16.4  14.1 - 18.1 g/dL   HCT, POC 29.549.8  62.143.5 - 53.7 %   MCV 87.2  80 - 97 fL   MCH, POC 28.7  27 - 31.2 pg   MCHC 32.9  31.8 - 35.4 g/dL   RDW, POC 308128     Platelet Count, POC 378  142 - 424 K/uL   MPV 8.2  0 - 99.8 fL         Assessment & Plan:  Sprain knee meniscus Pain knee/Bursitis likely/Possible gout Trial his request hinged knee brace

## 2014-04-21 NOTE — Patient Instructions (Signed)
Gout Gout is an inflammatory arthritis caused by a buildup of uric acid crystals in the joints. Uric acid is a chemical that is normally present in the blood. When the level of uric acid in the blood is too high it can form crystals that deposit in your joints and tissues. This causes joint redness, soreness, and swelling (inflammation). Repeat attacks are common. Over time, uric acid crystals can form into masses (tophi) near a joint, destroying bone and causing disfigurement. Gout is treatable and often preventable. CAUSES  The disease begins with elevated levels of uric acid in the blood. Uric acid is produced by your body when it breaks down a naturally found substance called purines. Certain foods you eat, such as meats and fish, contain high amounts of purines. Causes of an elevated uric acid level include:  Being passed down from parent to child (heredity).  Diseases that cause increased uric acid production (such as obesity, psoriasis, and certain cancers).  Excessive alcohol use.  Diet, especially diets rich in meat and seafood.  Medicines, including certain cancer-fighting medicines (chemotherapy), water pills (diuretics), and aspirin.  Chronic kidney disease. The kidneys are no longer able to remove uric acid well.  Problems with metabolism. Conditions strongly associated with gout include:  Obesity.  High blood pressure.  High cholesterol.  Diabetes. Not everyone with elevated uric acid levels gets gout. It is not understood why some people get gout and others do not. Surgery, joint injury, and eating too much of certain foods are some of the factors that can lead to gout attacks. SYMPTOMS   An attack of gout comes on quickly. It causes intense pain with redness, swelling, and warmth in a joint.  Fever can occur.  Often, only one joint is involved. Certain joints are more commonly involved:  Base of the big toe.  Knee.  Ankle.  Wrist.  Finger. Without  treatment, an attack usually goes away in a few days to weeks. Between attacks, you usually will not have symptoms, which is different from many other forms of arthritis. DIAGNOSIS  Your caregiver will suspect gout based on your symptoms and exam. In some cases, tests may be recommended. The tests may include:  Blood tests.  Urine tests.  X-rays.  Joint fluid exam. This exam requires a needle to remove fluid from the joint (arthrocentesis). Using a microscope, gout is confirmed when uric acid crystals are seen in the joint fluid. TREATMENT  There are two phases to gout treatment: treating the sudden onset (acute) attack and preventing attacks (prophylaxis).  Treatment of an Acute Attack.  Medicines are used. These include anti-inflammatory medicines or steroid medicines.  An injection of steroid medicine into the affected joint is sometimes necessary.  The painful joint is rested. Movement can worsen the arthritis.  You may use warm or cold treatments on painful joints, depending which works best for you.  Treatment to Prevent Attacks.  If you suffer from frequent gout attacks, your caregiver may advise preventive medicine. These medicines are started after the acute attack subsides. These medicines either help your kidneys eliminate uric acid from your body or decrease your uric acid production. You may need to stay on these medicines for a very long time.  The early phase of treatment with preventive medicine can be associated with an increase in acute gout attacks. For this reason, during the first few months of treatment, your caregiver may also advise you to take medicines usually used for acute gout treatment. Be sure you   understand your caregiver's directions. Your caregiver may make several adjustments to your medicine dose before these medicines are effective.  Discuss dietary treatment with your caregiver or dietitian. Alcohol and drinks high in sugar and fructose and foods  such as meat, poultry, and seafood can increase uric acid levels. Your caregiver or dietitian can advise you on drinks and foods that should be limited. HOME CARE INSTRUCTIONS   Do not take aspirin to relieve pain. This raises uric acid levels.  Only take over-the-counter or prescription medicines for pain, discomfort, or fever as directed by your caregiver.  Rest the joint as much as possible. When in bed, keep sheets and blankets off painful areas.  Keep the affected joint raised (elevated).  Apply warm or cold treatments to painful joints. Use of warm or cold treatments depends on which works best for you.  Use crutches if the painful joint is in your leg.  Drink enough fluids to keep your urine clear or pale yellow. This helps your body get rid of uric acid. Limit alcohol, sugary drinks, and fructose drinks.  Follow your dietary instructions. Pay careful attention to the amount of protein you eat. Your daily diet should emphasize fruits, vegetables, whole grains, and fat-free or low-fat milk products. Discuss the use of coffee, vitamin C, and cherries with your caregiver or dietitian. These may be helpful in lowering uric acid levels.  Maintain a healthy body weight. SEEK MEDICAL CARE IF:   You develop diarrhea, vomiting, or any side effects from medicines.  You do not feel better in 24 hours, or you are getting worse. SEEK IMMEDIATE MEDICAL CARE IF:   Your joint becomes suddenly more tender, and you have chills or a fever. MAKE SURE YOU:   Understand these instructions.  Will watch your condition.  Will get help right away if you are not doing well or get worse. Document Released: 09/09/2000 Document Revised: 01/27/2014 Document Reviewed: 04/25/2012 [facility] Patient Information 2015 [place], [place]. This information is not intended to replace advice given to you by your health care provider. Make sure you discuss any questions you have with your health care  provider. Meniscus Tear with Phase I Rehab The meniscus is a C-shaped cartilage structure, located in the knee joint between the thigh bone (femur) and the shinbone (tibia). Two menisci are located in each knee joint: the inner and outer meniscus. The meniscus acts as an adapter between the thigh bone and shinbone, allowing them to fit properly together. It also functions as a shock absorber, to reduce the stress placed on the knee joint and to help supply nutrients to the knee joint cartilage. As people age, the meniscus begins to harden and become more vulnerable to injury. Meniscus tears are a common injury, especially in older athletes. Inner meniscus tears are more common than outer meniscus tears.  SYMPTOMS   Pain in the knee, especially with standing or squatting with the affected leg.  Tenderness along the joint line.  Swelling in the knee joint (effusion), usually starting 1 to 2 days after injury.  Locking or catching of the knee joint, causing inability to straighten the knee completely.  Giving way or buckling of the knee. CAUSES  A meniscus tear occurs when a force is placed on the meniscus that is greater than it can handle. Common causes of injury include:  Direct hit (trauma) to the knee.  Twisting, pivoting, or cutting (rapidly changing direction while running), kneeling or squatting.  Without injury, due to aging. RISK INCREASES  WITH:  Contact sports (football, rugby).  Sports in which cleats are used with pivoting (soccer, lacrosse) or sports in which good shoe grip and sudden change in direction are required (racquetball, basketball, squash).  Previous knee injury.  Associated knee injury, particularly ligament injuries.  Poor strength and flexibility. PREVENTION  Warm up and stretch properly before activity.  Maintain physical fitness:  Strength, flexibility, and endurance.  Cardiovascular fitness.  Protect the knee with a brace or elastic  bandage.  Wear properly fitted protective equipment (proper cleats for the surface). PROGNOSIS  Sometimes, meniscus tears heal on their own. However, definitive treatment requires surgery, followed by at least 6 weeks of recovery.  RELATED COMPLICATIONS   Recurring symptoms that result in a chronic problem.  Repeated knee injury, especially if sports are resumed too soon after injury or surgery.  Progression of the tear (the tear gets larger), if untreated.  Arthritis of the knee in later years (with or without surgery).  Complications of surgery, including infection, bleeding, injury to nerves (numbness, weakness, paralysis) continued pain, giving way, locking, nonhealing of meniscus (if repaired), need for further surgery, and knee stiffness (loss of motion). TREATMENT  Treatment first involves the use of ice and medicine, to reduce pain and inflammation. You may find using crutches to walk more comfortable. However, it is okay to bear weight on the injured knee, if the pain will allow it. Surgery is often advised as a definitive treatment. Surgery is performed through an incision near the joint (arthroscopically). The torn piece of the meniscus is removed, and if possible the joint cartilage is repaired. After surgery, the joint must be restrained. After restraint, it is important to perform strengthening and stretching exercises to help regain strength and a full range of motion. These exercises may be completed at home or with a therapist.  MEDICATION  If pain medicine is needed, nonsteroidal anti-inflammatory medicines (aspirin and ibuprofen), or other minor pain relievers (acetaminophen), are often advised.  Do not take pain medicine for 7 days before surgery.  Prescription pain relievers may be given, if your caregiver thinks they are needed. Use only as directed and only as much as you need. HEAT AND COLD  Cold treatment (icing) should be applied for 10 to 15 minutes every 2 to 3  hours for inflammation and pain, and immediately after activity that aggravates your symptoms. Use ice packs or an ice massage.  Heat treatment may be used before performing stretching and strengthening activities prescribed by your caregiver, physical therapist, or athletic trainer. Use a heat pack or a warm water soak. SEEK MEDICAL CARE IF:   Symptoms get worse or do not improve in 2 weeks, despite treatment.  New, unexplained symptoms develop. (Drugs used in treatment may produce side effects.) EXERCISES RANGE OF MOTION (ROM) AND STRETCHING EXERCISES - Meniscus Tear, Non-operative, Phase I These are some of the initial exercises with which you may start your rehabilitation program, until you see your caregiver again or until your symptoms are resolved. Remember:   These initial exercises are intended to be gentle. They will help you restore motion without increasing any swelling.  Completing these exercises allows less painful movement and prepares you for the more aggressive strengthening exercises in Phase II.  An effective stretch should be held for at least 30 seconds.  A stretch should never be painful. You should only feel a gentle lengthening or release in the stretched tissue. RANGE OF MOTION - Knee Flexion, Active  Lie on your back  with both knees straight. (If this causes back discomfort, bend your healthy knee, placing your foot flat on the floor.)  Slowly slide your heel back toward your buttocks until you feel a gentle stretch in the front of your knee or thigh.  Hold for __________ seconds. Slowly slide your heel back to the starting position. Repeat __________ times. Complete this exercise __________ times per day.  RANGE OF MOTION - Knee Flexion and Extension, Active-Assisted  Sit on the edge of a table or chair with your thighs firmly supported. It may be helpful to place a folded towel under the end of your right / left thigh.  Flexion (bending): Place the ankle of  your healthy leg on top of the other ankle. Use your healthy leg to gently bend your right / left knee until you feel a mild tension across the top of your knee.  Hold for __________ seconds.  Extension (straightening): Switch your ankles so your right / left leg is on top. Use your healthy leg to straighten your right / left knee until you feel a mild tension on the backside of your knee.  Hold for __________ seconds. Repeat __________ times. Complete __________ times per day. STRETCH - Knee Flexion, Supine  Lie on the floor with your right / left heel and foot lightly touching the wall. (Place both feet on the wall if you do not use a door frame.)  Without using any effort, allow gravity to slide your foot down the wall slowly until you feel a gentle stretch in the front of your right / left knee.  Hold this stretch for __________ seconds. Then return the leg to the starting position, using your healthy leg for help, if needed. Repeat __________ times. Complete this stretch __________ times per day.  STRETCH - Knee Extension Sitting  Sit with your right / left leg/heel propped on another chair, coffee table, or foot stool.  Allow your leg muscles to relax, letting gravity straighten out your knee.*  You should feel a stretch behind your right / left knee. Hold this position for __________ seconds. Repeat __________ times. Complete this stretch __________ times per day.  *Your physician, physical therapist or athletic trainer may instruct you place a __________ weight on your thigh, just above your kneecap, to deepen the stretch.  STRENGTHENING EXERCISES - Meniscus Tear, Non-operative, Phase I These exercises may help you when beginning to rehabilitate your injury. They may resolve your symptoms with or without further involvement from your physician, physical therapist or athletic trainer. While completing these exercises, remember:   Muscles can gain both the endurance and the strength  needed for everyday activities through controlled exercises.  Complete these exercises as instructed by your physician, physical therapist or athletic trainer. Progress the resistance and repetitions only as guided. STRENGTH - Quadriceps, Isometrics  Lie on your back with your right / left leg extended and your opposite knee bent.  Gradually tense the muscles in the front of your right / left thigh. You should see either your knee cap slide up toward your hip or increased dimpling just above the knee. This motion will push the back of the knee down toward the floor, mat, or bed on which you are lying.  Hold the muscle as tight as you can, without increasing your pain, for __________ seconds.  Relax the muscles slowly and completely between each repetition. Repeat __________ times. Complete this exercise __________ times per day.  STRENGTH - Quadriceps, Short Arcs   Lie  on your back. Place a __________ inch towel roll under your right / left knee, so that the knee bends slightly.  Raise only your lower leg by tightening the muscles in the front of your thigh. Do not allow your thigh to rise.  Hold this position for __________ seconds. Repeat __________ times. Complete this exercise __________ times per day.  OPTIONAL ANKLE WEIGHTS: Begin with ____________________, but DO NOT exceed ____________________. Increase in 1 pound/0.5 kilogram increments. STRENGTH - Quadriceps, Straight Leg Raises  Quality counts! Watch for signs that the quadriceps muscle is working, to be sure you are strengthening the correct muscles and not "cheating" by substituting with healthier muscles.  Lay on your back with your right / left leg extended and your opposite knee bent.  Tense the muscles in the front of your right / left thigh. You should see either your knee cap slide up or increased dimpling just above the knee. Your thigh may even shake a bit.  Tighten these muscles even more and raise your leg 4 to 6  inches off the floor. Hold for __________ seconds.  Keeping these muscles tense, lower your leg.  Relax the muscles slowly and completely in between each repetition. Repeat __________ times. Complete this exercise __________ times per day.  STRENGTH - Hamstring, Curls   Lay on your stomach with your legs extended. (If you lay on a bed, your feet may hang over the edge.)  Tighten the muscles in the back of your thigh to bend your right / left knee up to 90 degrees. Keep your hips flat on the bed.  Hold this position for __________ seconds.  Slowly lower your leg back to the starting position. Repeat __________ times. Complete this exercise __________ times per day.  STRENGTH - Quadriceps, Squats  Stand in a door frame so that your feet and knees are in line with the frame.  Use your hands for balance, not support, on the frame.  Slowly lower your weight, bending at the hips and knees. Keep your lower legs upright so that they are parallel with the door frame. Squat only within the range that does not increase your knee pain. Never let your hips drop below your knees.  Slowly return upright, pushing with your legs, not pulling with your hands. Repeat __________ times. Complete this exercise __________ times per day.  STRENGTH - Quad/VMO, Isometric   Sit in a chair with your right / left knee slightly bent. With your fingertips, feel the VMO muscle just above the inside of your knee. The VMO is important in controlling the position of your kneecap.  Keeping your fingertips on this muscle. Without actually moving your leg, attempt to drive your knee down as if straightening your leg. You should feel your VMO tense. If you have a difficult time, you may wish to try the same exercise on your healthy knee first.  Tense this muscle as hard as you can without increasing any knee pain.  Hold for __________ seconds. Relax the muscles slowly and completely in between each repetition. Repeat  __________ times. Complete exercise __________ times per day.  Document Released: 09/26/1998 Document Revised: 12/05/2011 Document Reviewed: 12/25/2008 [facility] Patient Information 2015 [place], [place]. This information is not intended to replace advice given to you by your health care provider. Make sure you discuss any questions you have with your health care provider.

## 2014-04-22 ENCOUNTER — Encounter: Payer: Self-pay | Admitting: Radiology

## 2014-04-22 LAB — URIC ACID: Uric Acid, Serum: 6.1 mg/dL (ref 4.0–7.8)

## 2015-01-27 ENCOUNTER — Emergency Department (HOSPITAL_COMMUNITY)
Admission: EM | Admit: 2015-01-27 | Discharge: 2015-01-27 | Disposition: A | Discharge status: Home / Self Care | Payer: Federal, State, Local not specified - PPO | Attending: Emergency Medicine | Admitting: Emergency Medicine

## 2015-01-27 ENCOUNTER — Encounter (HOSPITAL_COMMUNITY): Payer: Self-pay | Admitting: General Practice

## 2015-01-27 DIAGNOSIS — X58XXXA Exposure to other specified factors, initial encounter: Secondary | ICD-10-CM | POA: Insufficient documentation

## 2015-01-27 DIAGNOSIS — Y998 Other external cause status: Secondary | ICD-10-CM | POA: Insufficient documentation

## 2015-01-27 DIAGNOSIS — Y9289 Other specified places as the place of occurrence of the external cause: Secondary | ICD-10-CM | POA: Insufficient documentation

## 2015-01-27 DIAGNOSIS — T383X1A Poisoning by insulin and oral hypoglycemic [antidiabetic] drugs, accidental (unintentional), initial encounter: Secondary | ICD-10-CM | POA: Insufficient documentation

## 2015-01-27 DIAGNOSIS — R61 Generalized hyperhidrosis: Secondary | ICD-10-CM | POA: Diagnosis not present

## 2015-01-27 DIAGNOSIS — Y9389 Activity, other specified: Secondary | ICD-10-CM | POA: Insufficient documentation

## 2015-01-27 DIAGNOSIS — E119 Type 2 diabetes mellitus without complications: Secondary | ICD-10-CM | POA: Insufficient documentation

## 2015-01-27 HISTORY — DX: Essential (primary) hypertension: I10

## 2015-01-27 LAB — CBC WITH DIFFERENTIAL/PLATELET
BASOS PCT: 0 % (ref 0–1)
Basophils Absolute: 0 10*3/uL (ref 0.0–0.1)
Eosinophils Absolute: 0.1 10*3/uL (ref 0.0–0.7)
Eosinophils Relative: 1 % (ref 0–5)
HEMATOCRIT: 48.7 % (ref 39.0–52.0)
HEMOGLOBIN: 17 g/dL (ref 13.0–17.0)
LYMPHS ABS: 2.7 10*3/uL (ref 0.7–4.0)
LYMPHS PCT: 32 % (ref 12–46)
MCH: 29.5 pg (ref 26.0–34.0)
MCHC: 34.9 g/dL (ref 30.0–36.0)
MCV: 84.5 fL (ref 78.0–100.0)
MONO ABS: 0.5 10*3/uL (ref 0.1–1.0)
MONOS PCT: 6 % (ref 3–12)
NEUTROS ABS: 5.2 10*3/uL (ref 1.7–7.7)
Neutrophils Relative %: 61 % (ref 43–77)
Platelets: 373 10*3/uL (ref 150–400)
RBC: 5.76 MIL/uL (ref 4.22–5.81)
RDW: 12.2 % (ref 11.5–15.5)
WBC: 8.5 10*3/uL (ref 4.0–10.5)

## 2015-01-27 LAB — COMPREHENSIVE METABOLIC PANEL
ALBUMIN: 4.4 g/dL (ref 3.5–5.0)
ALT: 18 U/L (ref 17–63)
ANION GAP: 13 (ref 5–15)
AST: 19 U/L (ref 15–41)
Alkaline Phosphatase: 60 U/L (ref 38–126)
BUN: 7 mg/dL (ref 6–20)
CALCIUM: 10 mg/dL (ref 8.9–10.3)
CO2: 25 mmol/L (ref 22–32)
CREATININE: 0.86 mg/dL (ref 0.61–1.24)
Chloride: 101 mmol/L (ref 101–111)
GFR calc Af Amer: >60 mL/min (ref >60)
GFR calc non Af Amer: >60 mL/min (ref >60)
Glucose, Bld: 153 mg/dL — ABNORMAL HIGH (ref 70–99)
Potassium: 3.4 mmol/L — ABNORMAL LOW (ref 3.5–5.1)
Sodium: 139 mmol/L (ref 135–145)
TOTAL PROTEIN: 7.8 g/dL (ref 6.5–8.1)
Total Bilirubin: 1.1 mg/dL (ref 0.3–1.2)

## 2015-01-27 LAB — CBG MONITORING, ED
GLUCOSE-CAPILLARY: 234 mg/dL — AB (ref 70–99)
GLUCOSE-CAPILLARY: 157 mg/dL — AB (ref 70–99)
Glucose-Capillary: 151 mg/dL — ABNORMAL HIGH (ref 70–99)
Glucose-Capillary: 111 mg/dL — ABNORMAL HIGH (ref 70–99)
Glucose-Capillary: 156 mg/dL — ABNORMAL HIGH (ref 70–99)

## 2015-01-27 NOTE — Discharge Instructions (Signed)
Hypoglycemia °Hypoglycemia occurs when the glucose in your blood is too low. Glucose is a type of sugar that is your body's main energy source. Hormones, such as insulin and glucagon, control the level of glucose in the blood. Insulin lowers blood glucose and glucagon increases blood glucose. Having too much insulin in your blood stream, or not eating enough food containing sugar, can result in hypoglycemia. Hypoglycemia can happen to people with or without diabetes. It can develop quickly and can be a medical emergency.  °CAUSES  °· Missing or delaying meals. °· Not eating enough carbohydrates at meals. °· Taking too much diabetes medicine. °· Not timing your oral diabetes medicine or insulin doses with meals, snacks, and exercise. °· Nausea and vomiting. °· Certain medicines. °· Severe illnesses, such as hepatitis, kidney disorders, and certain eating disorders. °· Increased activity or exercise without eating something extra or adjusting medicines. °· Drinking too much alcohol. °· A nerve disorder that affects body functions like your heart rate, blood pressure, and digestion (autonomic neuropathy). °· A condition where the stomach muscles do not function properly (gastroparesis). Therefore, medicines and food may not absorb properly. °· Rarely, a tumor of the pancreas can produce too much insulin. °SYMPTOMS  °· Hunger. °· Sweating (diaphoresis). °· Change in body temperature. °· Shakiness. °· Headache. °· Anxiety. °· Lightheadedness. °· Irritability. °· Difficulty concentrating. °· Dry mouth. °· Tingling or numbness in the hands or feet. °· Restless sleep or sleep disturbances. °· Altered speech and coordination. °· Change in mental status. °· Seizures or prolonged convulsions. °· Combativeness. °· Drowsiness (lethargic). °· Weakness. °· Increased heart rate or palpitations. °· Confusion. °· Pale, gray skin color. °· Blurred or double vision. °· Fainting. °DIAGNOSIS  °A physical exam and medical history will be  performed. Your caregiver may make a diagnosis based on your symptoms. Blood tests and other lab tests may be performed to confirm a diagnosis. Once the diagnosis is made, your caregiver will see if your signs and symptoms go away once your blood glucose is raised.  °TREATMENT  °Usually, you can easily treat your hypoglycemia when you notice symptoms. °· Check your blood glucose. If it is less than 70 mg/dl, take one of the following:   °¨ 3-4 glucose tablets.   °¨ ½ cup juice.   °¨ ½ cup regular soda.   °¨ 1 cup skim milk.   °¨ ½-1 tube of glucose gel.   °¨ 5-6 hard candies.   °· Avoid high-fat drinks or food that may delay a rise in blood glucose levels. °· Do not take more than the recommended amount of sugary foods, drinks, gel, or tablets. Doing so will cause your blood glucose to go too high.   °· Wait 10-15 minutes and recheck your blood glucose. If it is still less than 70 mg/dl or below your target range, repeat treatment.   °· Eat a snack if it is more than 1 hour until your next meal.   °There may be a time when your blood glucose may go so low that you are unable to treat yourself at home when you start to notice symptoms. You may need someone to help you. You may even faint or be unable to swallow. If you cannot treat yourself, someone will need to bring you to the hospital.  °HOME CARE INSTRUCTIONS °· If you have diabetes, follow your diabetes management plan by: °¨ Taking your medicines as directed. °¨ Following your exercise plan. °¨ Following your meal plan. Do not skip meals. Eat on time. °¨ Testing your blood   glucose regularly. Check your blood glucose before and after exercise. If you exercise longer or different than usual, be sure to check blood glucose more frequently. °¨ Wearing your medical alert jewelry that says you have diabetes. °· Identify the cause of your hypoglycemia. Then, develop ways to prevent the recurrence of hypoglycemia. °· Do not take a hot bath or shower right after an  insulin shot. °· Always carry treatment with you. Glucose tablets are the easiest to carry. °· If you are going to drink alcohol, drink it only with meals. °· Tell friends or family members ways to keep you safe during a seizure. This may include removing hard or sharp objects from the area or turning you on your side. °· Maintain a healthy weight. °SEEK MEDICAL CARE IF:  °· You are having problems keeping your blood glucose in your target range. °· You are having frequent episodes of hypoglycemia. °· You feel you might be having side effects from your medicines. °· You are not sure why your blood glucose is dropping so low. °· You notice a change in vision or a new problem with your vision. °SEEK IMMEDIATE MEDICAL CARE IF:  °· Confusion develops. °· A change in mental status occurs. °· The inability to swallow develops. °· Fainting occurs. °Document Released: 09/12/2005 Document Revised: 09/17/2013 Document Reviewed: 01/09/2012 °Exit[employer]® Patient Information ©2015 [employer]. This information is not intended to replace advice given to you by your health care provider. Make sure you discuss any questions you have with your health care provider. ° °

## 2015-01-27 NOTE — ED Notes (Addendum)
CBG 111. Patient asking for another [place] sandwich.

## 2015-01-27 NOTE — ED Notes (Signed)
CBG 156 

## 2015-01-27 NOTE — ED Notes (Signed)
CBG value 157.

## 2015-01-27 NOTE — ED Notes (Signed)
Pt sent to ED from MD office. Pt had a checkup at MD office due to feet pain. Pt was prescribed 8 units of insulin but was given 80 units. Pt is A/O. Pt denies any CP or SOB. Pt reports "feeling a little loopy", "I can tell something is up".

## 2015-01-27 NOTE — ED Provider Notes (Signed)
CSN: [id number]     Arrival date & time 01/27/15  1619 History   First MD Initiated Contact with Patient 01/27/15 1641     Chief Complaint  Patient presents with  . Drug Overdose     Patient is a 39 y.o. male presenting with Overdose. The history is provided by the patient. No language interpreter was used.  Drug Overdose   Dakota Becker presents from his family physician's office for accidental overdose. He was seeing his family doctor for a routine visit and he had lab work performed. He was given insulin in the office. He states that they accidentally gave him 80 units of NovoLog instead of 8 units of NovoLog. He states he feels a little bit loopy but otherwise has no complaints. He denies any chest pain, shortness of breath, nausea, vomiting, diarrhea. He is diaphoretic in the ED and he states he is always diaphoretic but this is a little bit worse than usual. He's had 2 pieces of chicken today. He was just getting started on medications for diabetes and has not been on prior oral or some cutaneous agents.  Past Medical History  Diagnosis Date  . Diabetes mellitus without complication    Past Surgical History  Procedure Laterality Date  . Appendectomy     Family History  Problem Relation Age of Onset  . Diabetes Mother   . Diabetes Father    History  Substance Use Topics  . Smoking status: Never Smoker   . Smokeless tobacco: Not on file  . Alcohol Use: 0.0 - 1.0 oz/week    0-2 drink(s) per week    Review of Systems  All other systems reviewed and are negative.     Allergies  Review of patient's allergies indicates no known allergies.  Home Medications   Prior to Admission medications   Medication Sig Start Date End Date Taking? Authorizing Provider  ibuprofen (ADVIL,MOTRIN) 800 MG tablet Take 1 tablet (800 mg total) by mouth every 8 (eight) hours as needed. 04/21/14   Jonita Albee, MD   BP 151/77 mmHg  Pulse 86  Temp(Src) 98.2 F (36.8 C) (Oral)  Resp 26  Ht   (1.854 m)  Wt 270 lb (122.471 kg)  BMI 35.63 kg/m2  SpO2 98% Physical Exam  Constitutional: He is oriented to person, place, and time. He appears well-developed and well-nourished.  HENT:  Head: Normocephalic and atraumatic.  Cardiovascular: Normal rate and regular rhythm.   No murmur heard. Pulmonary/Chest: Effort normal and breath sounds normal. No respiratory distress.  Abdominal: Soft. There is no tenderness. There is no rebound and no guarding.  Musculoskeletal: He exhibits no edema or tenderness.  Neurological: He is alert and oriented to person, place, and time.  Skin: Skin is warm. He is diaphoretic.  Psychiatric: He has a normal mood and affect. His behavior is normal.  Nursing note and vitals reviewed.   ED Course  Procedures (including critical care time) Labs Review Labs Reviewed  COMPREHENSIVE METABOLIC PANEL - Abnormal; Notable for the following:    Potassium 3.4 (*)    Glucose, Bld 153 (*)    All other components within normal limits  CBG MONITORING, ED - Abnormal; Notable for the following:    Glucose-Capillary 234 (*)    All other components within normal limits  CBG MONITORING, ED - Abnormal; Notable for the following:    Glucose-Capillary 151 (*)    All other components within normal limits  CBG MONITORING, ED - Abnormal; Notable for  the following:    Glucose-Capillary 157 (*)    All other components within normal limits  CBG MONITORING, ED - Abnormal; Notable for the following:    Glucose-Capillary 111 (*)    All other components within normal limits  CBG MONITORING, ED - Abnormal; Notable for the following:    Glucose-Capillary 156 (*)    All other components within normal limits  CBC WITH DIFFERENTIAL/PLATELET    Imaging Review No results found.   EKG Interpretation None      MDM   Final diagnoses:  Insulin overdose, accidental or unintentional, initial encounter   Patient here for evaluation following an accidental overdose.  Patient diaphoretic and anxious on initial evaluation. Patient eating without difficulty in the department. Patient's blood sugar did decrease but he did not have any hypoglycemia. Discussed the case with poison control who recommended observation for a total of 6 hours post injection and if the patient does not have any hypoglycemia requiring IV dextrose that he would be stable for discharge. Discussed with patient home care for accidental insulin overdose as well as close return precautions.    Tilden FossaElizabeth Jenesa Foresta, MD 01/28/15 [phone]

## 2015-01-27 NOTE — ED Notes (Signed)
[facility]  Info given.

## 2015-07-14 ENCOUNTER — Encounter (HOSPITAL_COMMUNITY): Payer: Self-pay | Admitting: Emergency Medicine

## 2015-07-14 ENCOUNTER — Emergency Department (HOSPITAL_COMMUNITY)
Admission: EM | Admit: 2015-07-14 | Discharge: 2015-07-14 | Disposition: A | Discharge status: Home / Self Care | Payer: Federal, State, Local not specified - PPO | Attending: Emergency Medicine | Admitting: Emergency Medicine

## 2015-07-14 DIAGNOSIS — R42 Dizziness and giddiness: Secondary | ICD-10-CM | POA: Insufficient documentation

## 2015-07-14 DIAGNOSIS — E119 Type 2 diabetes mellitus without complications: Secondary | ICD-10-CM | POA: Insufficient documentation

## 2015-07-14 DIAGNOSIS — I10 Essential (primary) hypertension: Secondary | ICD-10-CM | POA: Insufficient documentation

## 2015-07-14 DIAGNOSIS — J3489 Other specified disorders of nose and nasal sinuses: Secondary | ICD-10-CM | POA: Diagnosis present

## 2015-07-14 DIAGNOSIS — R112 Nausea with vomiting, unspecified: Secondary | ICD-10-CM | POA: Insufficient documentation

## 2015-07-14 DIAGNOSIS — H5500 Unspecified nystagmus: Secondary | ICD-10-CM | POA: Diagnosis not present

## 2015-07-14 MED ORDER — PROMETHAZINE HCL 25 MG PO TABS: 25.0000 mg | ORAL_TABLET | Freq: Four times a day (QID) | ORAL | Status: DC | PRN

## 2015-07-14 NOTE — ED Notes (Signed)
Per pt, states he has had a sinus infection for a couple of days-states now it is infecting his equilibrium-nauseated

## 2015-07-14 NOTE — ED Notes (Signed)
Bed: [facility] Expected date:  Expected time:  Means of arrival:  Comments: 

## 2015-07-14 NOTE — Discharge Instructions (Signed)
Benign Positional Vertigo Vertigo is the feeling that you or your surroundings are moving when they are not. Benign positional vertigo is the most common form of vertigo. The cause of this condition is not serious (is benign). This condition is triggered by certain movements and positions (is positional). This condition can be dangerous if it occurs while you are doing something that could endanger you or others, such as driving.  CAUSES In many cases, the cause of this condition is not known. It may be caused by a disturbance in an area of the inner ear that helps your brain to sense movement and balance. This disturbance can be caused by a viral infection (labyrinthitis), head injury, or repetitive motion. RISK FACTORS This condition is more likely to develop in:  Women.  People who are 50 years of age or older. SYMPTOMS Symptoms of this condition usually happen when you move your head or your eyes in different directions. Symptoms may start suddenly, and they usually last for less than a minute. Symptoms may include:  Loss of balance and falling.  Feeling like you are spinning or moving.  Feeling like your surroundings are spinning or moving.  Nausea and vomiting.  Blurred vision.  Dizziness.  Involuntary eye movement (nystagmus). Symptoms can be mild and cause only slight annoyance, or they can be severe and interfere with daily life. Episodes of benign positional vertigo may return (recur) over time, and they may be triggered by certain movements. Symptoms may improve over time. DIAGNOSIS This condition is usually diagnosed by medical history and a physical exam of the head, neck, and ears. You may be referred to a health care provider who specializes in ear, nose, and throat (ENT) problems (otolaryngologist) or a provider who specializes in disorders of the nervous system (neurologist). You may have additional testing, including:  MRI.  A CT scan.  Eye movement tests. Your  health care provider may ask you to change positions quickly while he or she watches you for symptoms of benign positional vertigo, such as nystagmus. Eye movement may be tested with an electronystagmogram (ENG), caloric stimulation, the Dix-Hallpike test, or the roll test.  An electroencephalogram (EEG). This records electrical activity in your brain.  Hearing tests. TREATMENT Usually, your health care provider will treat this by moving your head in specific positions to adjust your inner ear back to normal. Surgery may be needed in severe cases, but this is rare. In some cases, benign positional vertigo may resolve on its own in 2-4 weeks. HOME CARE INSTRUCTIONS Safety  Move slowly.Avoid sudden body or head movements.  Avoid driving.  Avoid operating heavy machinery.  Avoid doing any tasks that would be dangerous to you or others if a vertigo episode would occur.  If you have trouble walking or keeping your balance, try using a cane for stability. If you feel dizzy or unstable, sit down right away.  Return to your normal activities as told by your health care provider. Ask your health care provider what activities are safe for you. General Instructions  Take over-the-counter and prescription medicines only as told by your health care provider.  Avoid certain positions or movements as told by your health care provider.  Drink enough fluid to keep your urine clear or pale yellow.  Keep all follow-up visits as told by your health care provider. This is important. SEEK MEDICAL CARE IF:  You have a fever.  Your condition gets worse or you develop new symptoms.  Your family or friends   notice any behavioral changes.  Your nausea or vomiting gets worse.  You have numbness or a "pins and needles" sensation. SEEK IMMEDIATE MEDICAL CARE IF:  You have difficulty speaking or moving.  You are always dizzy.  You faint.  You develop severe headaches.  You have weakness in your  legs or arms.  You have changes in your hearing or vision.  You develop a stiff neck.  You develop sensitivity to light.   This information is not intended to replace advice given to you by your health care provider. Make sure you discuss any questions you have with your health care provider.   Document Released: 06/20/2006 Document Revised: 06/03/2015 Document Reviewed: 01/05/2015 Elsevier Interactive Patient Education 2016 [employer]  

## 2015-07-14 NOTE — ED Provider Notes (Signed)
CSN: [id number]     Arrival date & time 07/14/15  [id number] History   First MD Initiated Contact with Patient 07/14/15 1149     Chief Complaint  Patient presents with  . Recurrent Sinusitis     (Consider location/radiation/quality/duration/timing/severity/associated sxs/prior Treatment) HPI Comments: 39yo M who p/w sinus pressure and dizziness. Patient states that he has had sinus pressure on the left side of his face for the last few days. Yesterday he began having a headache behind his left eye associated with the sinus pressure. Last night he began sneezing multiple times and then became dizzy, feeling like he was spinning. He became nauseated and vomited. He has noticed that he is okay turning his head to the right but becomes very dizzy when turning his head to the left or when looking up. He has continued to have sinus pressure throughout today. He denies any tinnitus, fevers, cough, sudden onset of headache, or neck pain/stiffness.  The history is provided by the patient.    Past Medical History  Diagnosis Date  . Diabetes mellitus without complication (HCC)   . Hypertension    Past Surgical History  Procedure Laterality Date  . Appendectomy     Family History  Problem Relation Age of Onset  . Diabetes Mother   . Diabetes Father    Social History  Substance Use Topics  . Smoking status: Never Smoker   . Smokeless tobacco: None  . Alcohol Use: 0.0 - 1.0 oz/week    0-2 Standard drinks or equivalent per week    Review of Systems  10 Systems reviewed and are negative for acute change except as noted in the HPI.   Allergies  Review of patient's allergies indicates no known allergies.  Home Medications   Prior to Admission medications   Medication Sig Start Date End Date Taking? Authorizing Provider  naproxen sodium (ANAPROX) 220 MG tablet Take 660-880 mg by mouth daily as needed (pain).   Yes Historical Provider, MD  Tetrahydrozoline HCl (EYE DROPS OP) Apply 2-3 drops  to eye daily as needed (cleaning out eyes).   Yes Historical Provider, MD  promethazine (PHENERGAN) 25 MG tablet Take 1 tablet (25 mg total) by mouth every 6 (six) hours as needed for nausea or vomiting. 07/14/15   Ambrose Finlandachel Morgan Harleen Fineberg, MD   BP 163/99 mmHg  Pulse 98  Temp(Src) 98.6 F (37 C) (Oral)  Resp 18  SpO2 100% Physical Exam  Constitutional: He is oriented to person, place, and time. He appears well-developed and well-nourished. No distress.  Uncomfortable, Awake, alert  HENT:  Head: Normocephalic and atraumatic.  Eyes: Conjunctivae and EOM are normal. Pupils are equal, round, and reactive to light.  Neck: Neck supple.  Cardiovascular: Normal rate, regular rhythm and normal heart sounds.   No murmur heard. Pulmonary/Chest: Effort normal and breath sounds normal. No respiratory distress.  Abdominal: Soft. Bowel sounds are normal. He exhibits no distension.  Musculoskeletal: He exhibits no edema.  Neurological: He is alert and oriented to person, place, and time. He has normal reflexes. No cranial nerve deficit. He exhibits normal muscle tone.  Fluent speech; +horizontal and rotary nystagmus w/ [employer] [place] to left  Skin: Skin is warm and dry.  Psychiatric: He has a normal mood and affect. Judgment and thought content normal.  Nursing note and vitals reviewed.   ED Course  Procedures (including critical care time) Labs Review Labs Reviewed - No data to display    MDM   Final diagnoses:  Sinus  pressure  Vertigo   39 year old male who presents with several days of sinus pressure and sneezing as well as vertigo symptoms that began last night and has continued throughout today, associated with nausea and vomiting. Patient uncomfortable but nontoxic in appearance at presentation. He was afebrile. Exam showed positive Dix-Hallpike maneuver with nystagmus when head turned to left. Performed Epley maneuver and patient was comfortable after sitting up. His symptoms are consistent  with BPPV. He denies any neurologic symptoms to suggest acute intracranial process. I've instructed on supportive care for his sinus congestion and including Claritin/Zyrtec, nasal saline washes, and decongestants as needed. Provided instructions for Epley maneuver at home and reviewed return precautions. Patient voiced understanding and was discharged in satisfactory condition.  Laurence Spates, MD 07/14/15 [phone]

## 2017-01-01 ENCOUNTER — Encounter (HOSPITAL_COMMUNITY): Payer: Self-pay | Admitting: Emergency Medicine

## 2017-01-01 ENCOUNTER — Emergency Department (HOSPITAL_COMMUNITY)
Admission: EM | Admit: 2017-01-01 | Discharge: 2017-01-01 | Disposition: A | Discharge status: Home / Self Care | Payer: Federal, State, Local not specified - PPO | Attending: Emergency Medicine | Admitting: Emergency Medicine

## 2017-01-01 ENCOUNTER — Emergency Department (HOSPITAL_COMMUNITY): Payer: Federal, State, Local not specified - PPO

## 2017-01-01 DIAGNOSIS — W19XXXA Unspecified fall, initial encounter: Secondary | ICD-10-CM

## 2017-01-01 DIAGNOSIS — S29012A Strain of muscle and tendon of back wall of thorax, initial encounter: Secondary | ICD-10-CM | POA: Diagnosis not present

## 2017-01-01 DIAGNOSIS — M545 Low back pain: Secondary | ICD-10-CM | POA: Diagnosis not present

## 2017-01-01 DIAGNOSIS — Y939 Activity, unspecified: Secondary | ICD-10-CM | POA: Diagnosis not present

## 2017-01-01 DIAGNOSIS — S39012A Strain of muscle, fascia and tendon of lower back, initial encounter: Secondary | ICD-10-CM

## 2017-01-01 DIAGNOSIS — Y999 Unspecified external cause status: Secondary | ICD-10-CM | POA: Insufficient documentation

## 2017-01-01 DIAGNOSIS — S0990XA Unspecified injury of head, initial encounter: Secondary | ICD-10-CM | POA: Diagnosis not present

## 2017-01-01 DIAGNOSIS — W001XXA Fall from stairs and steps due to ice and snow, initial encounter: Secondary | ICD-10-CM | POA: Diagnosis not present

## 2017-01-01 DIAGNOSIS — S060X0A Concussion without loss of consciousness, initial encounter: Secondary | ICD-10-CM | POA: Insufficient documentation

## 2017-01-01 DIAGNOSIS — E119 Type 2 diabetes mellitus without complications: Secondary | ICD-10-CM | POA: Diagnosis not present

## 2017-01-01 DIAGNOSIS — I10 Essential (primary) hypertension: Secondary | ICD-10-CM | POA: Diagnosis not present

## 2017-01-01 DIAGNOSIS — S161XXA Strain of muscle, fascia and tendon at neck level, initial encounter: Secondary | ICD-10-CM | POA: Diagnosis not present

## 2017-01-01 DIAGNOSIS — Y929 Unspecified place or not applicable: Secondary | ICD-10-CM | POA: Insufficient documentation

## 2017-01-01 DIAGNOSIS — M546 Pain in thoracic spine: Secondary | ICD-10-CM | POA: Diagnosis not present

## 2017-01-01 DIAGNOSIS — H66001 Acute suppurative otitis media without spontaneous rupture of ear drum, right ear: Secondary | ICD-10-CM | POA: Diagnosis not present

## 2017-01-01 DIAGNOSIS — R51 Headache: Secondary | ICD-10-CM | POA: Diagnosis not present

## 2017-01-01 DIAGNOSIS — S199XXA Unspecified injury of neck, initial encounter: Secondary | ICD-10-CM | POA: Diagnosis not present

## 2017-01-01 MED ORDER — AMOXICILLIN-POT CLAVULANATE 875-125 MG PO TABS: 1.0000 | ORAL_TABLET | Freq: Two times a day (BID) | ORAL | 0 refills | Status: AC

## 2017-01-01 MED ORDER — CYCLOBENZAPRINE HCL 10 MG PO TABS: 10.0000 mg | ORAL_TABLET | Freq: Two times a day (BID) | ORAL | 0 refills | Status: DC | PRN

## 2017-01-01 MED ORDER — DIPHENHYDRAMINE HCL 25 MG PO CAPS
25.0000 mg | ORAL_CAPSULE | Freq: Once | ORAL | Status: AC
  Administered 2017-01-01: 25 mg via ORAL
  Filled 2017-01-01: qty 1

## 2017-01-01 MED ORDER — CYCLOBENZAPRINE HCL 10 MG PO TABS
5.0000 mg | ORAL_TABLET | Freq: Once | ORAL | Status: AC
  Administered 2017-01-01: 5 mg via ORAL
  Filled 2017-01-01: qty 1

## 2017-01-01 MED ORDER — KETOROLAC TROMETHAMINE 60 MG/2ML IM SOLN
60.0000 mg | Freq: Once | INTRAMUSCULAR | Status: AC
  Administered 2017-01-01: 60 mg via INTRAMUSCULAR
  Filled 2017-01-01: qty 2

## 2017-01-01 MED ORDER — PROCHLORPERAZINE MALEATE 10 MG PO TABS
10.0000 mg | ORAL_TABLET | Freq: Once | ORAL | Status: AC
  Administered 2017-01-01: 10 mg via ORAL
  Filled 2017-01-01: qty 1

## 2017-01-01 NOTE — ED Notes (Signed)
ED Provider at bedside. 

## 2017-01-01 NOTE — ED Triage Notes (Signed)
Pt slipped on ice and fell at about 0620 today, hit back, arm, posterior head on wood steps, slid down 1 step. No LOC. Was wearing thick toboggan on head which gave some cushion.  Pt c/o posterior headache, nausea, mental sluggishness. Initially had some dizziness which has resolved. No vision changes, weakness, confusion, slurred speech.

## 2017-01-01 NOTE — ED Notes (Addendum)
C-COLLAR APPLIED. PT TOLERATED AND STATES COMFORT WITH NECK SUPPORTED WITH COLLAR.

## 2017-01-01 NOTE — ED Notes (Signed)
Patient transported to CT 

## 2017-01-01 NOTE — ED Provider Notes (Signed)
Dakota Becker   CSN: [id number] Arrival date & time: 01/01/17  4098     History   Chief Complaint Chief Complaint  Patient presents with  . Headache  . Fall    HPI Dakota Becker is a 41 y.o. male.  HPI   Slipped on the ice 620 AM. Hit the back of head on edge of the step, wooden steps.  Having pain developing now lower back on the steps. Tightness in the neck/upper back, and lower back has sharp pain.  Numbness initially in arm that went away. Severe headache in the back of the head.  Nausea. No vomiting.  Also reports days of nasal congestion and right ear pain. Initially thought he had an ear infection, however then began to express pain in his upper mouth, that he had dental pain. Was trying to see his dentist. Pain also is present in the right side of his face and is sharp in nature. This was present prior to the fall. Denies associated fevers.  Past Medical History:  Diagnosis Date  . Diabetes mellitus without complication (HCC)   . Hypertension     There are no active problems to display for this patient.   Past Surgical History:  Procedure Laterality Date  . APPENDECTOMY         Home Medications    Prior to Admission medications   Medication Sig Start Date End Date Taking? Authorizing Provider  ibuprofen (ADVIL,MOTRIN) 200 MG tablet Take 1,000 mg by mouth every 6 (six) hours as needed.   Yes Historical Provider, MD  amoxicillin-clavulanate (AUGMENTIN) 875-125 MG tablet Take 1 tablet by mouth every 12 (twelve) hours. 01/01/17 01/08/17  Alvira Monday, MD  cyclobenzaprine (FLEXERIL) 10 MG tablet Take 1 tablet (10 mg total) by mouth 2 (two) times daily as needed for muscle spasms. 01/01/17   Alvira Monday, MD    Family History Family History  Problem Relation Age of Onset  . Diabetes Mother   . Diabetes Father     Social History Social History  Substance Use Topics  . Smoking status: Never Smoker  . Smokeless tobacco: Not on file  .  Alcohol use 0.0 - 1.0 oz/week     Allergies   Patient has no known allergies.   Review of Systems Review of Systems  Constitutional: Negative for fever.  HENT: Positive for congestion and dental problem (trying to see dentist). Negative for sore throat.   Eyes: Negative for visual disturbance.  Respiratory: Negative for cough and shortness of breath.   Cardiovascular: Negative for chest pain.  Gastrointestinal: Positive for nausea. Negative for abdominal pain.  Genitourinary: Negative for difficulty urinating.  Musculoskeletal: Negative for back pain and neck stiffness.  Skin: Negative for rash.  Neurological: Positive for headaches. Negative for syncope.     Physical Exam Updated Vital Signs BP (!) 158/101 (BP Location: Right Arm)   Pulse 81   Temp 98.1 F (36.7 C) (Oral)   Resp 16   SpO2 100%   Physical Exam  Constitutional: He is oriented to person, place, and time. He appears well-developed and well-nourished. No distress.  HENT:  Head: Normocephalic and atraumatic.  Right TM bulging No hemotympanum   Eyes: Conjunctivae and EOM are normal.  Neck: Normal range of motion.  Cardiovascular: Normal rate, regular rhythm, normal heart sounds and intact distal pulses.  Exam reveals no gallop and no friction rub.   No murmur heard. Pulmonary/Chest: Effort normal and breath sounds normal. No respiratory distress. He  has no wheezes. He has no rales.  Abdominal: Soft. He exhibits no distension. There is no tenderness. There is no guarding.  Musculoskeletal: He exhibits no edema.       Cervical back: He exhibits tenderness and bony tenderness.       Thoracic back: He exhibits tenderness and bony tenderness.       Lumbar back: He exhibits tenderness and bony tenderness.  Neurological: He is alert and oriented to person, place, and time. He has normal strength. No sensory deficit.  Skin: Skin is warm and dry. He is not diaphoretic.  Nursing Becker and vitals reviewed.    ED  Treatments / Results  Labs (all labs ordered are listed, but only abnormal results are displayed) Labs Reviewed - No data to display  EKG  EKG Interpretation None       Radiology Dg Thoracic Spine 2 View  Result Date: 01/01/2017 CLINICAL DATA:  Status post fall, back pain EXAM: THORACIC SPINE 2 VIEWS COMPARISON:  None. FINDINGS: Normal thoracic kyphosis. No evidence of fracture or dislocation. Vertebral body heights are maintained. Visualized lungs are clear. IMPRESSION: Negative. Electronically Signed   By: Charline Bills M.D.   On: 01/01/2017 11:01   Dg Lumbar Spine Complete  Result Date: 01/01/2017 CLINICAL DATA:  Status post fall, back pain EXAM: LUMBAR SPINE - COMPLETE 4+ VIEW COMPARISON:  CT abdomen/pelvis dated 03/05/2008 FINDINGS: Five lumbar type vertebral bodies. Normal lumbar lordosis. No evidence of fracture or dislocation. Vertebral body heights and intervertebral disc spaces are maintained. Visualized bony pelvis appears intact. Calcified left pelvic phleboliths. IMPRESSION: Negative. Electronically Signed   By: Charline Bills M.D.   On: 01/01/2017 11:02   Ct Head Wo Contrast  Result Date: 01/01/2017 CLINICAL DATA:  Pt slipped on ice and fell at about 0620 today, hit posterior head on wood steps, slid down 1 step. No LOC.Pt c/o posterior headache, nausea, mental sluggishness. Initially had some dizziness which has resolved. EXAM: CT HEAD WITHOUT CONTRAST CT CERVICAL SPINE WITHOUT CONTRAST TECHNIQUE: Multidetector CT imaging of the head and cervical spine was performed following the standard protocol without intravenous contrast. Multiplanar CT image reconstructions of the cervical spine were also generated. COMPARISON:  None. FINDINGS: CT HEAD FINDINGS Brain: Ventricles are normal in size and configuration. All areas of the brain demonstrate normal gray-white matter attenuation. There is no mass, hemorrhage, edema or other evidence of acute parenchymal abnormality. No  extra-axial hemorrhage. Vascular: No hyperdense vessel or unexpected calcification. Skull: Normal. Negative for fracture or focal lesion. Sinuses/Orbits: No acute finding. Other: None. CT CERVICAL SPINE FINDINGS Alignment: Straightening of the normal cervical spine lordosis. No evidence of acute vertebral body subluxation. Skull base and vertebrae: No fracture line or displaced fracture fragment identified. Facet joints appear intact and normally aligned throughout. Soft tissues and spinal canal: No prevertebral fluid or swelling. No visible canal hematoma. Disc levels:  Disc spaces are well preserved throughout. Upper chest: Negative. Other: None. IMPRESSION: 1. Normal head CT. 2. Straightening of the normal cervical spine lordosis, likely related to patient positioning or muscle spasm. No fracture or acute subluxation within the cervical spine. Electronically Signed   By: Bary Richard M.D.   On: 01/01/2017 10:52   Ct Cervical Spine Wo Contrast  Result Date: 01/01/2017 CLINICAL DATA:  Pt slipped on ice and fell at about 0620 today, hit posterior head on wood steps, slid down 1 step. No LOC.Pt c/o posterior headache, nausea, mental sluggishness. Initially had some dizziness which has resolved. EXAM: CT  HEAD WITHOUT CONTRAST CT CERVICAL SPINE WITHOUT CONTRAST TECHNIQUE: Multidetector CT imaging of the head and cervical spine was performed following the standard protocol without intravenous contrast. Multiplanar CT image reconstructions of the cervical spine were also generated. COMPARISON:  None. FINDINGS: CT HEAD FINDINGS Brain: Ventricles are normal in size and configuration. All areas of the brain demonstrate normal gray-white matter attenuation. There is no mass, hemorrhage, edema or other evidence of acute parenchymal abnormality. No extra-axial hemorrhage. Vascular: No hyperdense vessel or unexpected calcification. Skull: Normal. Negative for fracture or focal lesion. Sinuses/Orbits: No acute finding.  Other: None. CT CERVICAL SPINE FINDINGS Alignment: Straightening of the normal cervical spine lordosis. No evidence of acute vertebral body subluxation. Skull base and vertebrae: No fracture line or displaced fracture fragment identified. Facet joints appear intact and normally aligned throughout. Soft tissues and spinal canal: No prevertebral fluid or swelling. No visible canal hematoma. Disc levels:  Disc spaces are well preserved throughout. Upper chest: Negative. Other: None. IMPRESSION: 1. Normal head CT. 2. Straightening of the normal cervical spine lordosis, likely related to patient positioning or muscle spasm. No fracture or acute subluxation within the cervical spine. Electronically Signed   By: Bary Richard M.D.   On: 01/01/2017 10:52    Procedures Procedures (including critical care time)  Medications Ordered in ED Medications  cyclobenzaprine (FLEXERIL) tablet 5 mg (5 mg Oral Given 01/01/17 1108)  prochlorperazine (COMPAZINE) tablet 10 mg (10 mg Oral Given 01/01/17 1108)  diphenhydrAMINE (BENADRYL) capsule 25 mg (25 mg Oral Given 01/01/17 1108)  ketorolac (TORADOL) injection 60 mg (60 mg Intramuscular Given 01/01/17 1215)     Initial Impression / Assessment and Plan / ED Course  I have reviewed the triage vital signs and the nursing notes.  Pertinent labs & imaging results that were available during my care of the patient were reviewed by me and considered in my medical decision making (see chart for details).    41 year old male with a history of diabetes and hypertension presents with concern for slip on the ice hitting his head, with headache, neck pain and back pain. Patient also reports that he's had right-sided facial pain and ear pain over last couple days, one to see his dentist regarding this.  CT head and cervical spine were done which showed no acute antibodies. X-rays of the thoracic and lumbar spine show no acute abnormalities. Suspect patient likely has a concussion, and  discussed concussion care. Patient also likely has muscular strain. He chooses to wear cervical collar for comfort. He is otherwise neurologically intact. Regarding his ear pain and dental pain. I see no sign of dental abscess, an area of concern is edentulous.  He has right facial tenderness, signs of right-sided otitis media on exam. We'll give him his prescription for Augmentin for otitis media and possible bacterial sinusitis. A prescription for Flexeril for muscle aches, recommend Tylenol and ibuprofen. Patient discharged in stable condition with understanding of reasons to return.   Final Clinical Impressions(s) / ED Diagnoses   Final diagnoses:  Fall, initial encounter  Concussion without loss of consciousness, initial encounter  Acute suppurative otitis media of right ear without spontaneous rupture of tympanic membrane, recurrence not specified  Back strain, initial encounter    New Prescriptions Discharge Medication List as of 01/01/2017 11:50 AM    START taking these medications   Details  amoxicillin-clavulanate (AUGMENTIN) 875-125 MG tablet Take 1 tablet by mouth every 12 (twelve) hours., Starting Sun 01/01/2017, Until Sun 01/08/2017, Print  cyclobenzaprine (FLEXERIL) 10 MG tablet Take 1 tablet (10 mg total) by mouth 2 (two) times daily as needed for muscle spasms., Starting Sun 01/01/2017, Print         Alvira Monday, MD 01/01/17 [phone]

## 2017-01-16 DIAGNOSIS — K080 Exfoliation of teeth due to systemic causes: Secondary | ICD-10-CM | POA: Diagnosis not present

## 2017-01-25 DIAGNOSIS — K080 Exfoliation of teeth due to systemic causes: Secondary | ICD-10-CM | POA: Diagnosis not present

## 2018-02-06 DIAGNOSIS — H00033 Abscess of eyelid right eye, unspecified eyelid: Secondary | ICD-10-CM | POA: Diagnosis not present

## 2018-02-06 DIAGNOSIS — B0233 Zoster keratitis: Secondary | ICD-10-CM | POA: Diagnosis not present

## 2018-02-06 DIAGNOSIS — H538 Other visual disturbances: Secondary | ICD-10-CM | POA: Diagnosis not present

## 2018-02-08 DIAGNOSIS — B0233 Zoster keratitis: Secondary | ICD-10-CM | POA: Diagnosis not present

## 2018-02-08 DIAGNOSIS — H538 Other visual disturbances: Secondary | ICD-10-CM | POA: Diagnosis not present

## 2018-02-08 DIAGNOSIS — H00033 Abscess of eyelid right eye, unspecified eyelid: Secondary | ICD-10-CM | POA: Diagnosis not present

## 2018-02-13 DIAGNOSIS — E1165 Type 2 diabetes mellitus with hyperglycemia: Secondary | ICD-10-CM | POA: Diagnosis not present

## 2018-02-13 DIAGNOSIS — E785 Hyperlipidemia, unspecified: Secondary | ICD-10-CM | POA: Diagnosis not present

## 2018-02-13 DIAGNOSIS — I10 Essential (primary) hypertension: Secondary | ICD-10-CM | POA: Diagnosis not present

## 2018-03-14 DIAGNOSIS — I10 Essential (primary) hypertension: Secondary | ICD-10-CM | POA: Diagnosis not present

## 2018-03-14 DIAGNOSIS — E1165 Type 2 diabetes mellitus with hyperglycemia: Secondary | ICD-10-CM | POA: Diagnosis not present

## 2018-04-02 DIAGNOSIS — E1165 Type 2 diabetes mellitus with hyperglycemia: Secondary | ICD-10-CM | POA: Diagnosis not present

## 2018-04-09 DIAGNOSIS — E1165 Type 2 diabetes mellitus with hyperglycemia: Secondary | ICD-10-CM | POA: Diagnosis not present

## 2018-04-09 DIAGNOSIS — Z6838 Body mass index (BMI) 38.0-38.9, adult: Secondary | ICD-10-CM | POA: Diagnosis not present

## 2018-12-10 ENCOUNTER — Emergency Department (HOSPITAL_COMMUNITY)
Admission: EM | Admit: 2018-12-10 | Discharge: 2018-12-10 | Disposition: A | Discharge status: Home / Self Care | Payer: Federal, State, Local not specified - PPO | Attending: Emergency Medicine | Admitting: Emergency Medicine

## 2018-12-10 ENCOUNTER — Other Ambulatory Visit: Payer: Self-pay

## 2018-12-10 ENCOUNTER — Encounter (HOSPITAL_COMMUNITY): Payer: Self-pay

## 2018-12-10 ENCOUNTER — Emergency Department (HOSPITAL_COMMUNITY): Payer: Federal, State, Local not specified - PPO

## 2018-12-10 DIAGNOSIS — E1165 Type 2 diabetes mellitus with hyperglycemia: Secondary | ICD-10-CM | POA: Diagnosis not present

## 2018-12-10 DIAGNOSIS — R739 Hyperglycemia, unspecified: Secondary | ICD-10-CM

## 2018-12-10 DIAGNOSIS — R1013 Epigastric pain: Secondary | ICD-10-CM

## 2018-12-10 DIAGNOSIS — K30 Functional dyspepsia: Secondary | ICD-10-CM | POA: Insufficient documentation

## 2018-12-10 DIAGNOSIS — R1011 Right upper quadrant pain: Secondary | ICD-10-CM | POA: Diagnosis not present

## 2018-12-10 DIAGNOSIS — I10 Essential (primary) hypertension: Secondary | ICD-10-CM | POA: Diagnosis not present

## 2018-12-10 DIAGNOSIS — R1110 Vomiting, unspecified: Secondary | ICD-10-CM | POA: Diagnosis not present

## 2018-12-10 DIAGNOSIS — K2900 Acute gastritis without bleeding: Secondary | ICD-10-CM | POA: Diagnosis not present

## 2018-12-10 DIAGNOSIS — R109 Unspecified abdominal pain: Secondary | ICD-10-CM | POA: Diagnosis not present

## 2018-12-10 LAB — CBC
HCT: 48.9 % (ref 39.0–52.0)
Hemoglobin: 16.3 g/dL (ref 13.0–17.0)
MCH: 28.7 pg (ref 26.0–34.0)
MCHC: 33.3 g/dL (ref 30.0–36.0)
MCV: 86.2 fL (ref 80.0–100.0)
Platelets: 334 10*3/uL (ref 150–400)
RBC: 5.67 MIL/uL (ref 4.22–5.81)
RDW: 11.8 % (ref 11.5–15.5)
WBC: 4.6 10*3/uL (ref 4.0–10.5)
nRBC: 0 % (ref 0.0–0.2)

## 2018-12-10 LAB — URINALYSIS, ROUTINE W REFLEX MICROSCOPIC
Bacteria, UA: NONE SEEN
Bilirubin Urine: NEGATIVE
Glucose, UA: >=500 mg/dL — AB
Hgb urine dipstick: NEGATIVE
Ketones, ur: 80 mg/dL — AB
Leukocytes,Ua: NEGATIVE
Nitrite: NEGATIVE
PH: 5 (ref 5.0–8.0)
Protein, ur: 30 mg/dL — AB
Specific Gravity, Urine: 1.032 — ABNORMAL HIGH (ref 1.005–1.030)

## 2018-12-10 LAB — COMPREHENSIVE METABOLIC PANEL
ALT: 19 U/L (ref 0–44)
AST: 20 U/L (ref 15–41)
Albumin: 4 g/dL (ref 3.5–5.0)
Alkaline Phosphatase: 57 U/L (ref 38–126)
Anion gap: 14 (ref 5–15)
BUN: 9 mg/dL (ref 6–20)
CO2: 27 mmol/L (ref 22–32)
Calcium: 9.6 mg/dL (ref 8.9–10.3)
Chloride: 96 mmol/L — ABNORMAL LOW (ref 98–111)
Creatinine, Ser: 0.76 mg/dL (ref 0.61–1.24)
GFR calc Af Amer: >60 mL/min (ref >60)
GFR calc non Af Amer: >60 mL/min (ref >60)
Glucose, Bld: 317 mg/dL — ABNORMAL HIGH (ref 70–99)
Potassium: 3.7 mmol/L (ref 3.5–5.1)
Sodium: 137 mmol/L (ref 135–145)
TOTAL PROTEIN: 8.4 g/dL — AB (ref 6.5–8.1)
Total Bilirubin: 0.6 mg/dL (ref 0.3–1.2)

## 2018-12-10 LAB — LIPASE, BLOOD: Lipase: 33 U/L (ref 11–51)

## 2018-12-10 LAB — CBG MONITORING, ED: Glucose-Capillary: 307 mg/dL — ABNORMAL HIGH (ref 70–99)

## 2018-12-10 MED ORDER — PANTOPRAZOLE SODIUM 20 MG PO TBEC: 20.0000 mg | DELAYED_RELEASE_TABLET | Freq: Every day | ORAL | 0 refills | Status: DC

## 2018-12-10 MED ORDER — ONDANSETRON HCL 4 MG/2ML IJ SOLN
4.0000 mg | Freq: Once | INTRAMUSCULAR | Status: AC
  Administered 2018-12-10: 4 mg via INTRAVENOUS
  Filled 2018-12-10: qty 2

## 2018-12-10 MED ORDER — LACTATED RINGERS IV BOLUS
1000.0000 mL | Freq: Once | INTRAVENOUS | Status: AC
  Administered 2018-12-10: 1000 mL via INTRAVENOUS

## 2018-12-10 MED ORDER — SODIUM CHLORIDE 0.9% FLUSH: 3.0000 mL | Freq: Once | INTRAVENOUS | Status: DC

## 2018-12-10 MED ORDER — ALUM & MAG HYDROXIDE-SIMETH 200-200-20 MG/5ML PO SUSP
30.0000 mL | Freq: Once | ORAL | Status: AC
  Administered 2018-12-10: 30 mL via ORAL
  Filled 2018-12-10: qty 30

## 2018-12-10 MED ORDER — LIDOCAINE VISCOUS HCL 2 % MT SOLN
15.0000 mL | Freq: Once | OROMUCOSAL | Status: AC
  Administered 2018-12-10: 15 mL via ORAL
  Filled 2018-12-10: qty 15

## 2018-12-10 NOTE — Discharge Instructions (Signed)
Thank you for allowing me to care for you today. Please return to the emergency department if you have new or worsening symptoms. Take your medications as instructed.  ° °

## 2018-12-10 NOTE — ED Provider Notes (Signed)
[facility]-EMERGENCY DEPT Provider Note   CSN: [id number] Arrival date & time: 12/10/18  1119    History   Chief Complaint Chief Complaint  Patient presents with  . Emesis  . Hyperglycemia    HPI Dakota Becker is a 43 y.o. male.     Patient is a 43 year old male with past medical history of diabetes, hypertension who presents to the emergency department for abdominal pain, vomiting, hyperglycemia.  Patient reports that he has not seen a primary care doctor or been on any of his medications since at least August 2019.  Reports that since the sixth of this month he had intermittent epigastric pain with bilious vomiting.  Last time he vomited was last night.  Patient reports that he went to urgent care today and they stated his glucose was in the 300s and there is ketones in his urine so they sent him here for further evaluation.  Currently the patient does not have any epigastric pain.  He reports that he feels nauseated and he gets a foul-smelling burping every time he has anything to eat or drink.  Denies any fever, chills, cough or URI symptoms.  Reports that he was having mild constipation and his last bowel movement was 2 days ago.     Past Medical History:  Diagnosis Date  . Diabetes mellitus without complication (HCC)   . Hypertension     There are no active problems to display for this patient.   Past Surgical History:  Procedure Laterality Date  . APPENDECTOMY          Home Medications    Prior to Admission medications   Medication Sig Start Date End Date Taking? Authorizing Provider  cyclobenzaprine (FLEXERIL) 10 MG tablet Take 1 tablet (10 mg total) by mouth 2 (two) times daily as needed for muscle spasms. Patient not taking: Reported on 12/10/2018 01/01/17   Alvira Monday, MD  pantoprazole (PROTONIX) 20 MG tablet Take 1 tablet (20 mg total) by mouth daily for 30 days. 12/10/18 01/09/19  Arlyn Dunning, PA-C    Family History Family  History  Problem Relation Age of Onset  . Diabetes Mother   . Diabetes Father     Social History Social History   Tobacco Use  . Smoking status: Never Smoker  . Smokeless tobacco: Never Used  Substance Use Topics  . Alcohol use: Yes    Alcohol/week: 0.0 - 2.0 standard drinks  . Drug use: No     Allergies   Milk-related compounds   Review of Systems Review of Systems  Constitutional: Negative for activity change, appetite change, chills and fever.  HENT: Negative for congestion, ear pain, rhinorrhea and sore throat.   Eyes: Negative for pain and visual disturbance.  Respiratory: Negative for cough, chest tightness and shortness of breath.   Cardiovascular: Negative for chest pain and palpitations.  Gastrointestinal: Positive for abdominal pain, constipation, nausea and vomiting. Negative for abdominal distention, anal bleeding and diarrhea.  Genitourinary: Negative for decreased urine volume, dysuria, hematuria and testicular pain.  Musculoskeletal: Negative for arthralgias and back pain.  Skin: Negative for color change and rash.  Allergic/Immunologic: Negative for environmental allergies and food allergies.  Neurological: Negative for seizures, syncope and light-headedness.  All other systems reviewed and are negative.    Physical Exam Updated Vital Signs BP (!) 153/102 (BP Location: Left Arm)   Pulse (!) 108   Temp 98.7 F (37.1 C) (Oral)   Resp 20   Ht (!) 6" (  0.152 m)   Wt 109.4 kg   SpO2 93%   BMI 4708.66 kg/m   Physical Exam Vitals signs and nursing note reviewed.  Constitutional:      General: He is not in acute distress.    Appearance: Normal appearance. He is well-developed. He is not ill-appearing, toxic-appearing or diaphoretic.  HENT:     Head: Normocephalic and atraumatic.     Nose: Nose normal. No congestion or rhinorrhea.     Mouth/Throat:     Mouth: Mucous membranes are moist.  Eyes:     Conjunctiva/sclera: Conjunctivae normal.  Neck:      Musculoskeletal: Neck supple.  Cardiovascular:     Rate and Rhythm: Normal rate and regular rhythm.     Heart sounds: No murmur.  Pulmonary:     Effort: Pulmonary effort is normal. No respiratory distress.     Breath sounds: Normal breath sounds.  Abdominal:     General: Abdomen is flat. There is no distension.     Palpations: Abdomen is soft.     Tenderness: There is no abdominal tenderness.  Skin:    General: Skin is warm and dry.     Capillary Refill: Capillary refill takes less than 2 seconds.  Neurological:     General: No focal deficit present.     Mental Status: He is alert.  Psychiatric:        Mood and Affect: Mood normal.      ED Treatments / Results  Labs (all labs ordered are listed, but only abnormal results are displayed) Labs Reviewed  COMPREHENSIVE METABOLIC PANEL - Abnormal; Notable for the following components:      Result Value   Chloride 96 (*)    Glucose, Bld 317 (*)    Total Protein 8.4 (*)    All other components within normal limits  URINALYSIS, ROUTINE W REFLEX MICROSCOPIC - Abnormal; Notable for the following components:   Specific Gravity, Urine 1.032 (*)    Glucose, UA >=500 (*)    Ketones, ur 80 (*)    Protein, ur 30 (*)    All other components within normal limits  CBG MONITORING, ED - Abnormal; Notable for the following components:   Glucose-Capillary 307 (*)    All other components within normal limits  LIPASE, BLOOD  CBC  CBG MONITORING, ED    EKG None  Radiology US Abdomen Limited Ruq  Result Date: 12/10/2018 CLINICAL DATA:  Abdominal pain EXAM: ULTRASOUND ABDOMEN LIMITED RIGHT UPPER QUADRANT COMPARISON:  None. FINDINGS: Gallbladder: No gallstones or wall thickening visualized. No sonographic Murphy sign noted by sonographer. Common bile duct: Diameter: 2.3 mm Liver: 3.2 x 3.1 x 2.3 cm hyperechoic mass adjacent to the gallbladder fossa without internal Doppler flow. Increased hepatic parenchymal echogenicity. Portal vein is  patent on color Doppler imaging with normal direction of blood flow towards the liver. IMPRESSION: 1. Increased hepatic parenchymal echogenicity as can be seen with hepatic steatosis. 2. 3.2 x 3.1 x 2.3 cm hyperechoic mass adjacent to the gallbladder fossa without internal Doppler flow. Finding is most compatible with hemangioma versus more focal fatty deposition. Electronically Signed   By: Elige Ko   On: 12/10/2018 14:16    Procedures Procedures (including critical care time)  Medications Ordered in ED Medications  sodium chloride flush (NS) 0.9 % injection 3 mL (has no administration in time range)  lactated ringers bolus 1,000 mL (0 mLs Intravenous Stopped 12/10/18 1505)  ondansetron (ZOFRAN) injection 4 mg (4 mg Intravenous Given 12/10/18 1212)  alum & mag hydroxide-simeth (MAALOX/MYLANTA) 200-200-20 MG/5ML suspension 30 mL (30 mLs Oral Given 12/10/18 1212)    And  lidocaine (XYLOCAINE) 2 % viscous mouth solution 15 mL (15 mLs Oral Given 12/10/18 1212)     Initial Impression / Assessment and Plan / ED Course  I have reviewed the triage vital signs and the nursing notes.  Pertinent labs & imaging results that were available during my care of the patient were reviewed by me and considered in my medical decision making (see chart for details).  Clinical Course as of Dec 10 1530  Mon Dec 10, 2018  1520 Patient doing well, not in DKA. RUQ u/s is negative for gallstones or inflammation. Likely his sx are related to uncontrolled diabetes and maybe some dyspepsia. Advised to start back on his prescriptions. He reports that he has his rx at the pharmacy ready to be picked up. Will also trial protonix. Case discussed with Dr. Rhunette Croft and plan agreed upon   [KM]    Clinical Course User Index [KM] Arlyn Dunning, PA-C       Based on review of vitals, medical screening exam, lab work and/or imaging, there does not appear to be an acute, emergent etiology for the patient's symptoms.  Counseled pt on good return precautions and encouraged both PCP and ED follow-up as needed.  Prior to discharge, I also discussed incidental imaging findings with patient in detail and advised appropriate, recommended follow-up in detail.  Clinical Impression: 1. Hyperglycemia   2. Pain, abdominal, RUQ   3. Hypertension, unspecified type   4. Dyspepsia     Disposition: Discharge    This note was prepared with assistance of Dragon voice recognition software. Occasional wrong-word or sound-a-like substitutions may have occurred due to the inherent limitations of voice recognition software.   Final Clinical Impressions(s) / ED Diagnoses   Final diagnoses:  Pain, abdominal, RUQ  Hyperglycemia  Hypertension, unspecified type  Dyspepsia    ED Discharge Orders         Ordered    pantoprazole (PROTONIX) 20 MG tablet  Daily     12/10/18 1525           Jeral Pinch 12/10/18 1532    Derwood Kaplan, MD 12/12/18 1214

## 2018-12-10 NOTE — ED Triage Notes (Signed)
Patient c/o emesis and hyperglycemia since 11/30/18. Patient states that he did have right upper abdominal pain that stopped on  12/07/18 after he took [employer]. Patient states that he also had a normal BM 3 days ago.

## 2018-12-28 IMAGING — CR DG LUMBAR SPINE COMPLETE 4+V
5 series · 5 of 5 positions shown · non-contrast
Comparison: CT abdomen/pelvis dated 03/05/2008

CLINICAL DATA: Status post fall, back pain

EXAM:
LUMBAR SPINE - COMPLETE 4+ VIEW

[t lumbar spine ap]
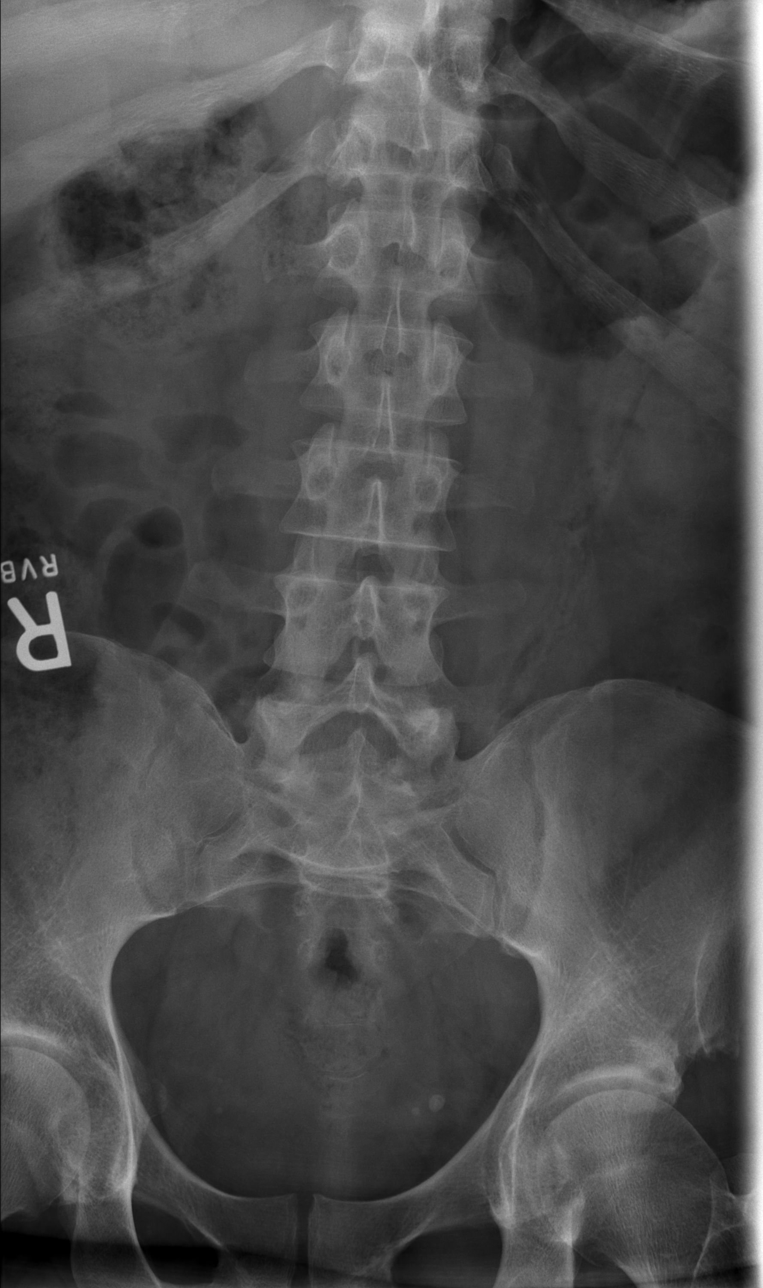

[t lumbar spine obl (1 of 2)]
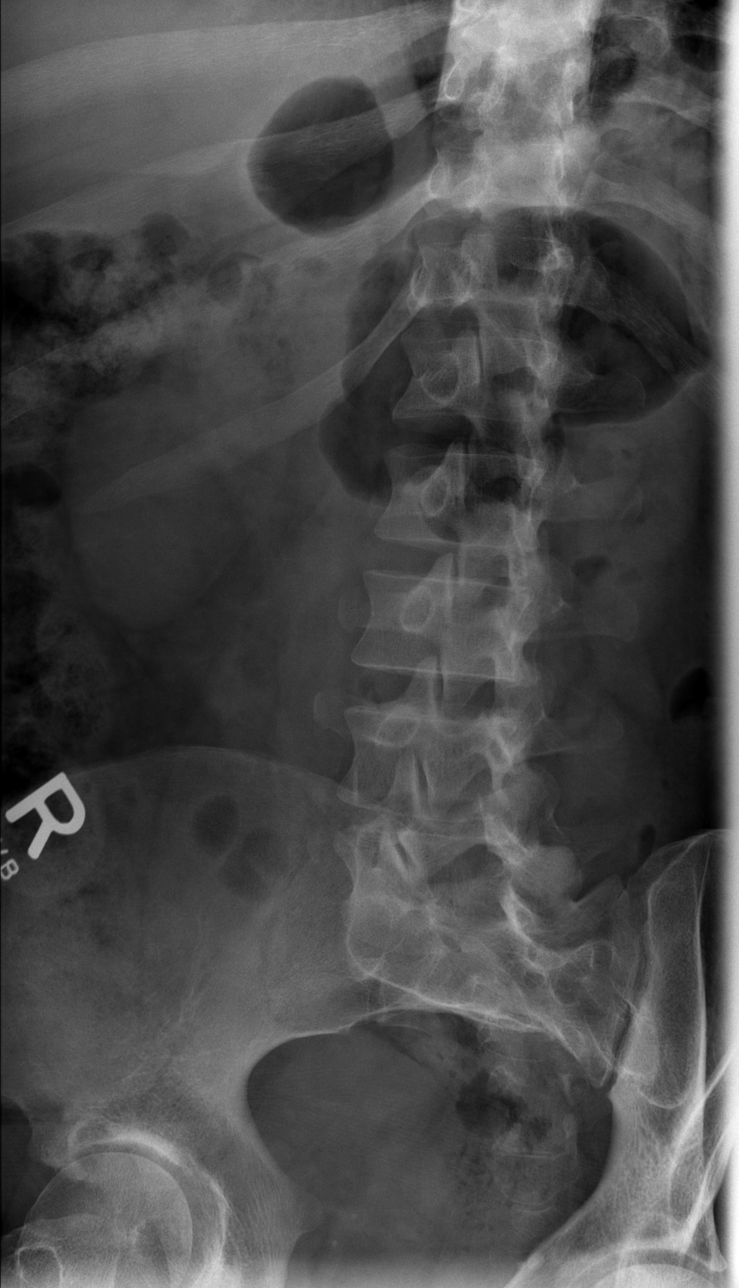

[t lumbar spine obl (2 of 2)]
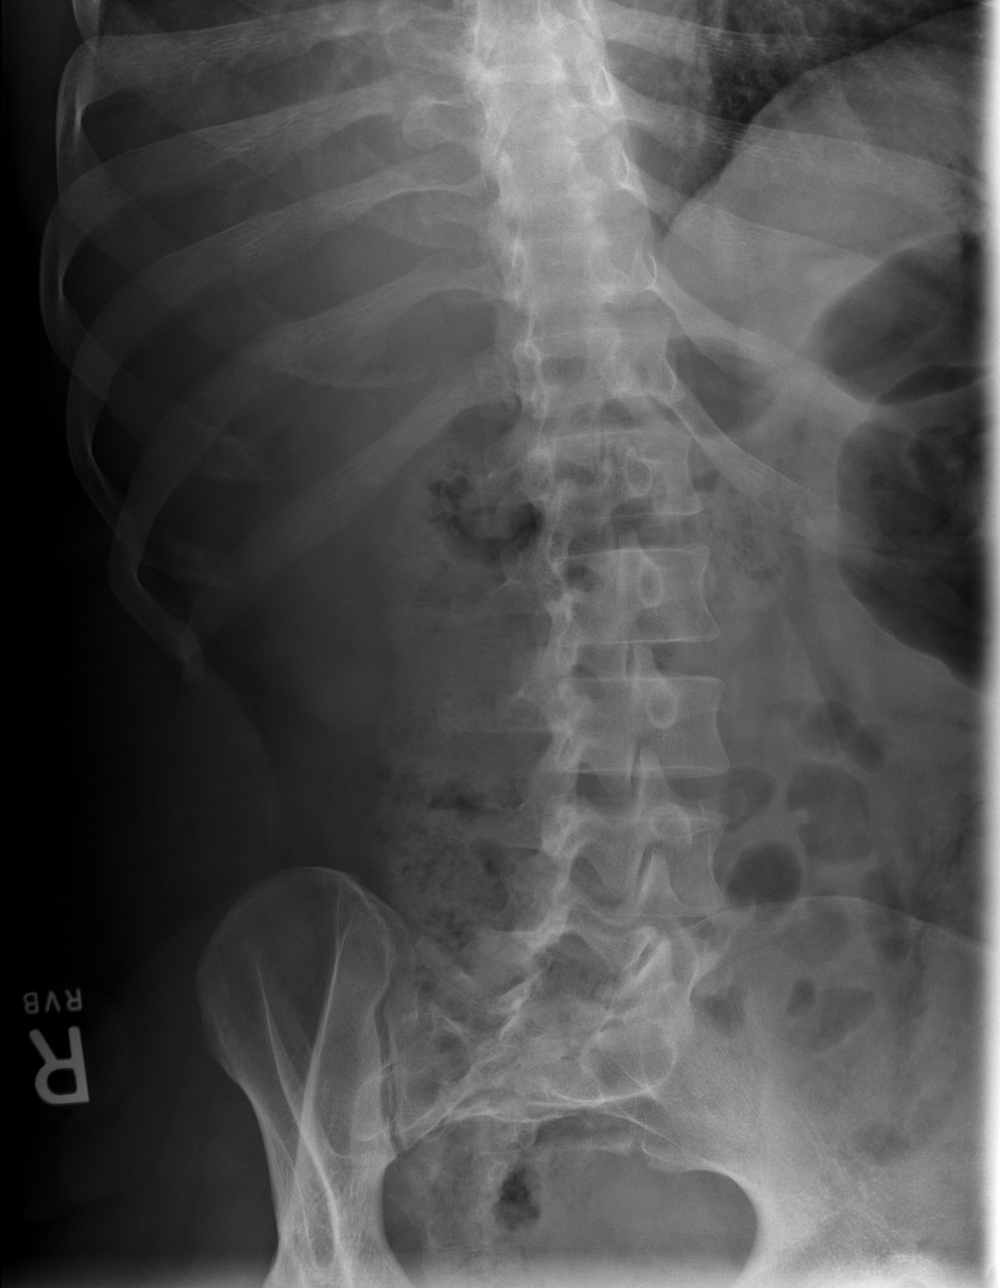

[t lumbar spine lat]
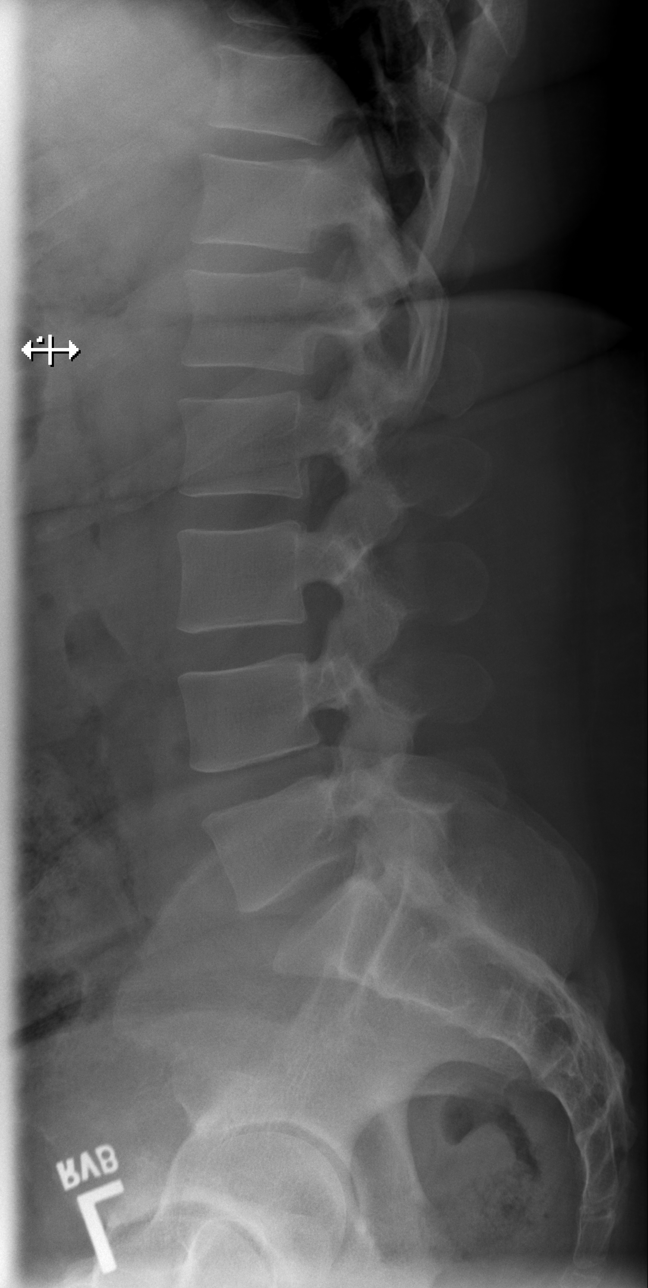

[t lumbar l-5 s-1 spot]
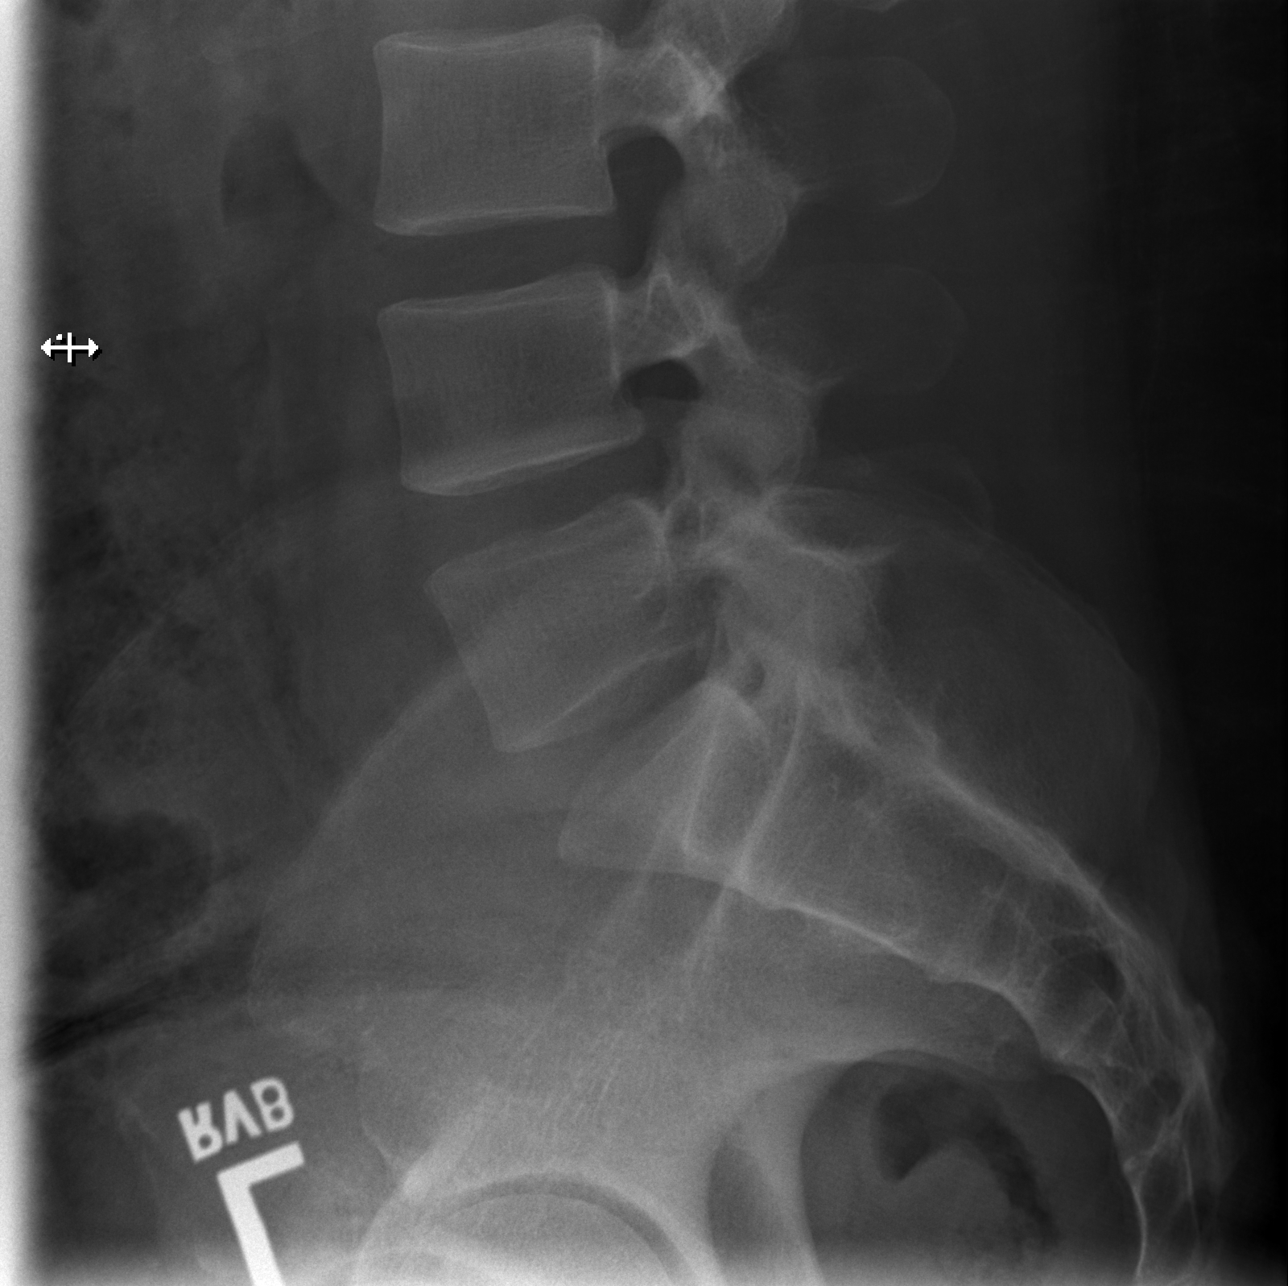

[5 of 5 positions shown; findings below may reference images not displayed]

FINDINGS: Five lumbar type vertebral bodies.

Normal lumbar lordosis.

No evidence of fracture or dislocation. Vertebral body heights and
intervertebral disc spaces are maintained.

Visualized bony pelvis appears intact.

Calcified left pelvic phleboliths.
IMPRESSION: Negative.

## 2019-02-07 DIAGNOSIS — K219 Gastro-esophageal reflux disease without esophagitis: Secondary | ICD-10-CM | POA: Diagnosis not present

## 2019-02-07 DIAGNOSIS — E782 Mixed hyperlipidemia: Secondary | ICD-10-CM | POA: Diagnosis not present

## 2019-02-07 DIAGNOSIS — I10 Essential (primary) hypertension: Secondary | ICD-10-CM | POA: Diagnosis not present

## 2019-02-07 DIAGNOSIS — E1169 Type 2 diabetes mellitus with other specified complication: Secondary | ICD-10-CM | POA: Diagnosis not present

## 2019-07-23 DIAGNOSIS — R5383 Other fatigue: Secondary | ICD-10-CM | POA: Diagnosis not present

## 2019-07-23 DIAGNOSIS — R05 Cough: Secondary | ICD-10-CM | POA: Diagnosis not present

## 2019-07-23 DIAGNOSIS — Z20828 Contact with and (suspected) exposure to other viral communicable diseases: Secondary | ICD-10-CM | POA: Diagnosis not present

## 2019-07-24 DIAGNOSIS — R5383 Other fatigue: Secondary | ICD-10-CM | POA: Diagnosis not present

## 2019-07-24 DIAGNOSIS — Z20828 Contact with and (suspected) exposure to other viral communicable diseases: Secondary | ICD-10-CM | POA: Diagnosis not present

## 2019-07-24 DIAGNOSIS — R05 Cough: Secondary | ICD-10-CM | POA: Diagnosis not present

## 2019-08-13 DIAGNOSIS — S0501XA Injury of conjunctiva and corneal abrasion without foreign body, right eye, initial encounter: Secondary | ICD-10-CM | POA: Diagnosis not present

## 2019-08-13 DIAGNOSIS — H179 Unspecified corneal scar and opacity: Secondary | ICD-10-CM | POA: Diagnosis not present

## 2019-08-14 DIAGNOSIS — S0501XA Injury of conjunctiva and corneal abrasion without foreign body, right eye, initial encounter: Secondary | ICD-10-CM | POA: Diagnosis not present

## 2019-08-16 DIAGNOSIS — S0501XD Injury of conjunctiva and corneal abrasion without foreign body, right eye, subsequent encounter: Secondary | ICD-10-CM | POA: Diagnosis not present

## 2019-12-06 ENCOUNTER — Ambulatory Visit: Payer: Federal, State, Local not specified - PPO | Attending: Internal Medicine

## 2019-12-06 DIAGNOSIS — Z23 Encounter for immunization: Secondary | ICD-10-CM

## 2019-12-06 NOTE — Progress Notes (Signed)
   Covid-19 Vaccination Clinic  Name:  DEREC MOZINGO    MRN: [id number] DOB: 1976/08/25  12/06/2019  Mr. Mash was observed post Covid-19 immunization for 15 minutes without incident. He was provided with Vaccine Information Sheet and instruction to access the V-Safe system.   Mr. Dorce was instructed to call 911 with any severe reactions post vaccine: Marland Kitchen Difficulty breathing  . Swelling of face and throat  . A fast heartbeat  . A bad rash all over body  . Dizziness and weakness   Immunizations Administered    Name Date Dose VIS Date Route   Pfizer COVID-19 Vaccine 12/06/2019  1:27 PM 0.3 mL 09/06/2019 Intramuscular   Manufacturer: [employer], [employer]   Lot: [id number]   NDC: [address]-8

## 2019-12-30 ENCOUNTER — Ambulatory Visit: Payer: Federal, State, Local not specified - PPO | Attending: Internal Medicine

## 2019-12-30 DIAGNOSIS — Z23 Encounter for immunization: Secondary | ICD-10-CM

## 2019-12-30 NOTE — Progress Notes (Signed)
   Covid-19 Vaccination Clinic  Name:  Dakota Becker    MRN: [id number] DOB: 04-13-1976  12/30/2019  Mr. [name] was observed post Covid-19 immunization for 15 minutes without incident. He was provided with Vaccine Information Sheet and instruction to access the V-Safe system.   Mr. [name] was instructed to call 911 with any severe reactions post vaccine: Marland Kitchen Difficulty breathing  . Swelling of face and throat  . A fast heartbeat  . A bad rash all over body  . Dizziness and weakness   Immunizations Administered    Name Date Dose VIS Date Route   Pfizer COVID-19 Vaccine 12/30/2019  3:23 PM 0.3 mL 09/06/2019 Intramuscular   Manufacturer: [employer], [employer]   Lot: [id number]   NDC: [address]-5

## 2020-05-20 DIAGNOSIS — G629 Polyneuropathy, unspecified: Secondary | ICD-10-CM | POA: Diagnosis not present

## 2020-05-20 DIAGNOSIS — E1169 Type 2 diabetes mellitus with other specified complication: Secondary | ICD-10-CM | POA: Diagnosis not present

## 2020-05-20 DIAGNOSIS — E782 Mixed hyperlipidemia: Secondary | ICD-10-CM | POA: Diagnosis not present

## 2020-05-20 DIAGNOSIS — I10 Essential (primary) hypertension: Secondary | ICD-10-CM | POA: Diagnosis not present

## 2020-05-20 DIAGNOSIS — E1149 Type 2 diabetes mellitus with other diabetic neurological complication: Secondary | ICD-10-CM | POA: Diagnosis not present

## 2020-10-04 DIAGNOSIS — Z1152 Encounter for screening for COVID-19: Secondary | ICD-10-CM | POA: Diagnosis not present

## 2020-12-05 IMAGING — US ULTRASOUND ABDOMEN LIMITED
1 series · 14 of 25 positions shown · non-contrast
Comparison: None.

CLINICAL DATA: Abdominal pain

EXAM:
ULTRASOUND ABDOMEN LIMITED RIGHT UPPER QUADRANT

[Series 1: ultrasound abdomen limited · 14 of 48 slices shown]
[im 1/48]
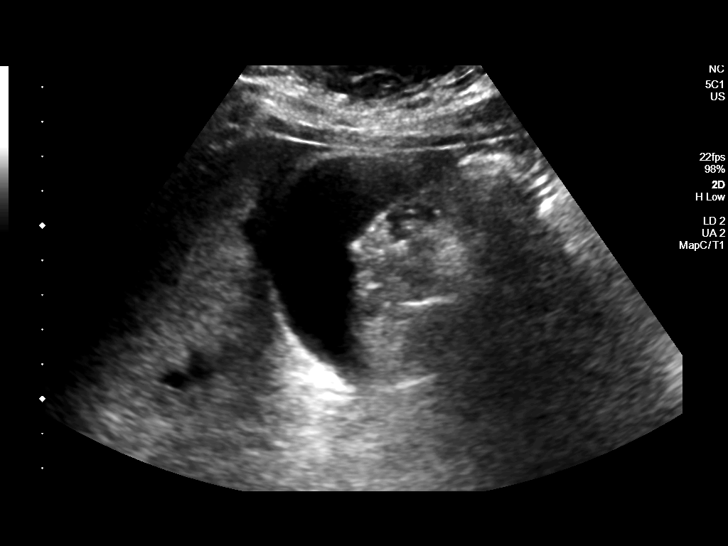
[im 4/48]
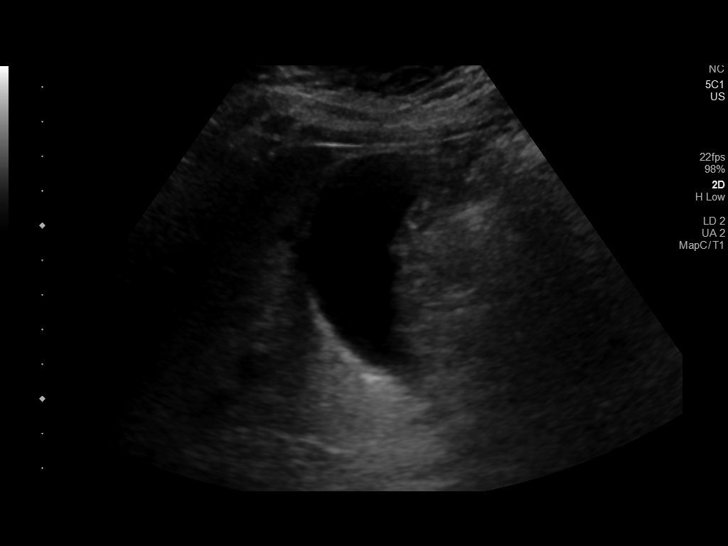
[im 8/48]
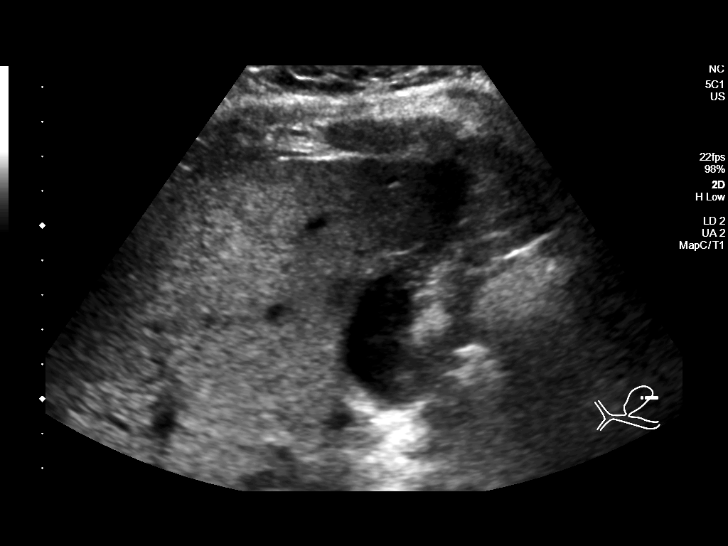
[im 12/48]
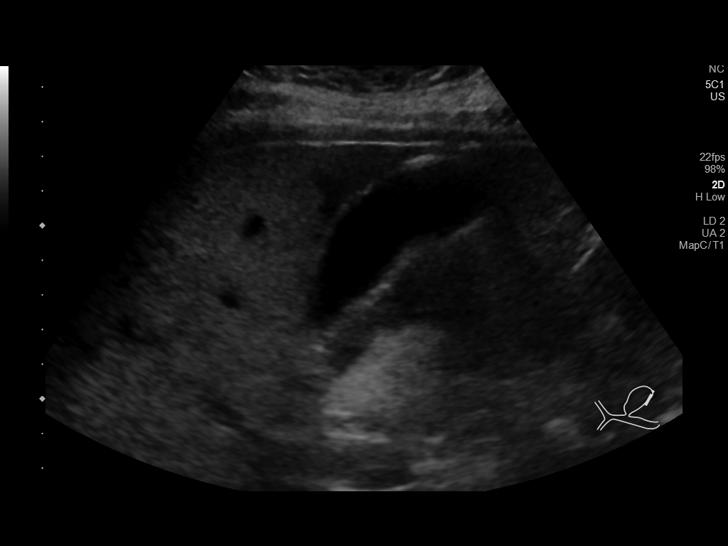
[im 16/48]
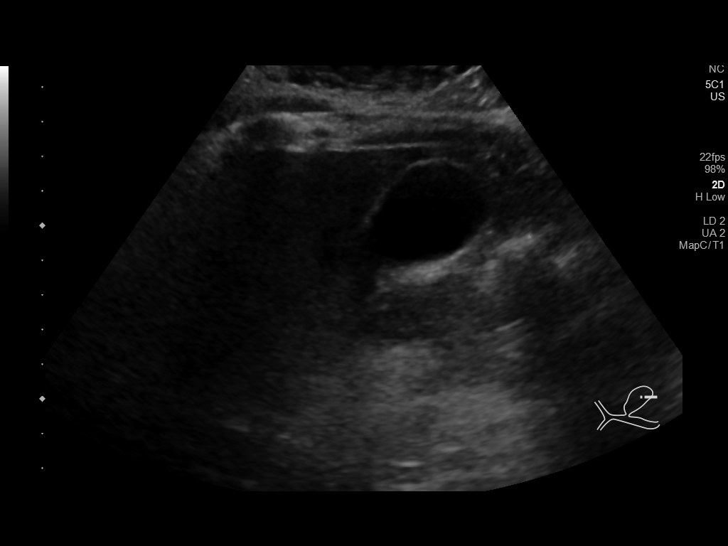
[im 18/48]
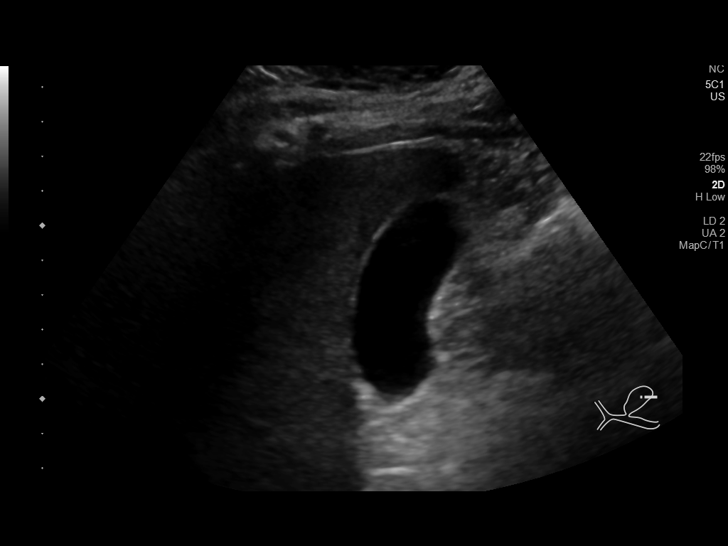
[im 22/48]
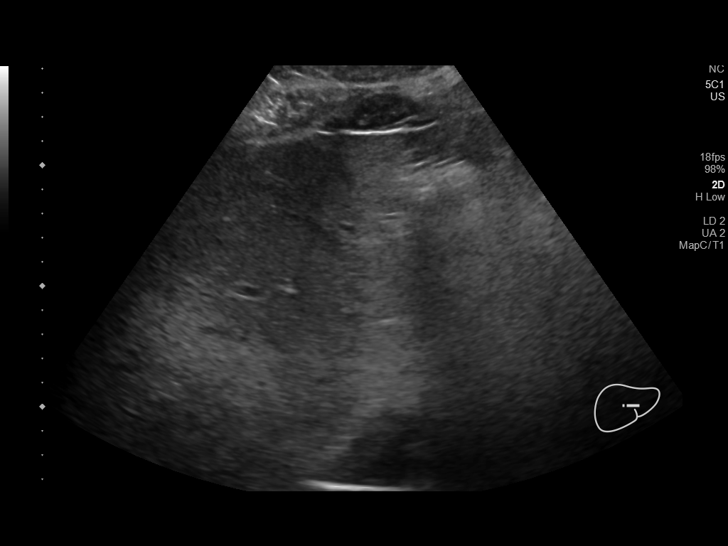
[im 26/48]
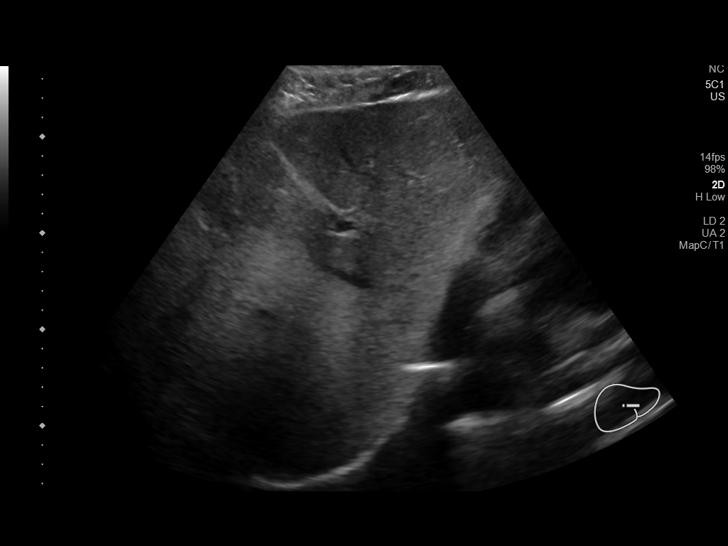
[im 30/48]
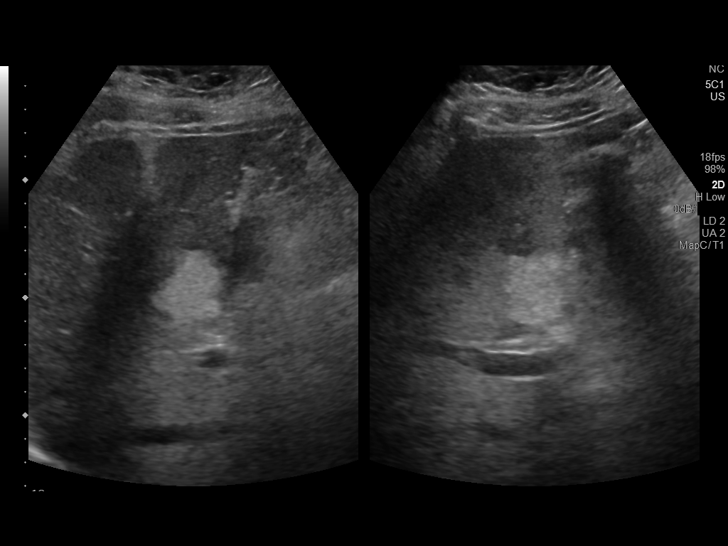
[im 32/48]
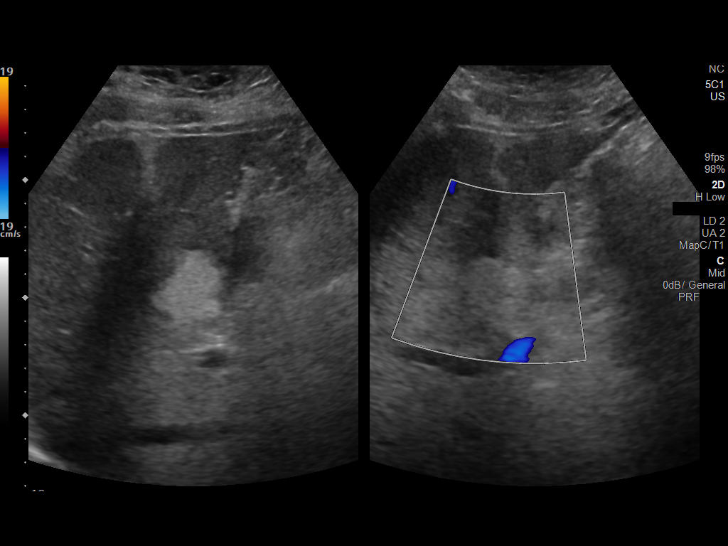
[im 36/48]
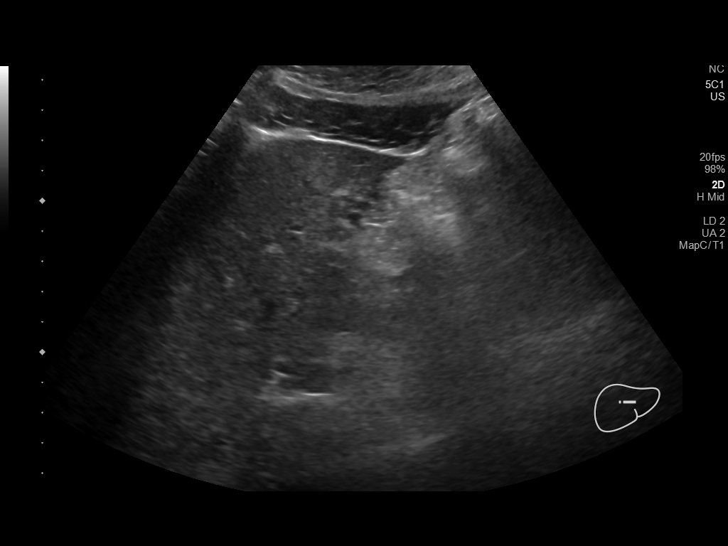
[im 40/48]
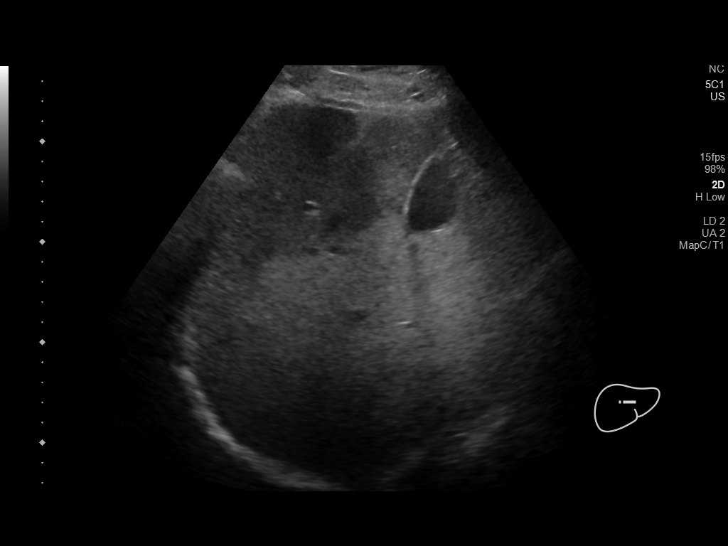
[im 44/48]
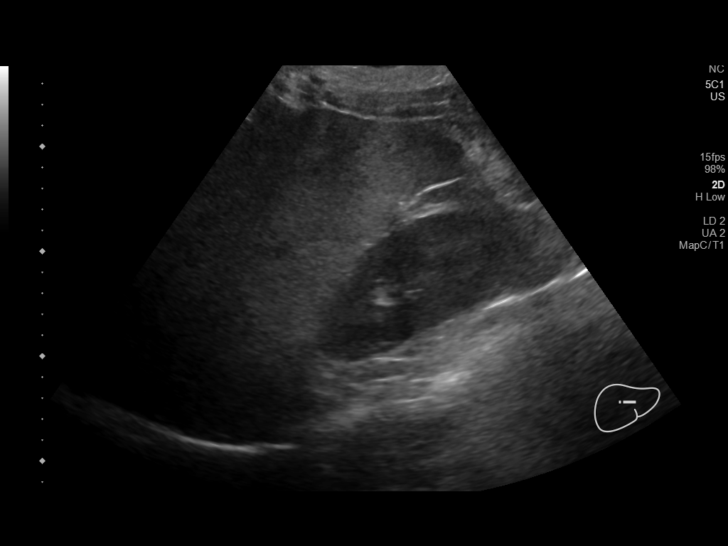
[im 48/48]
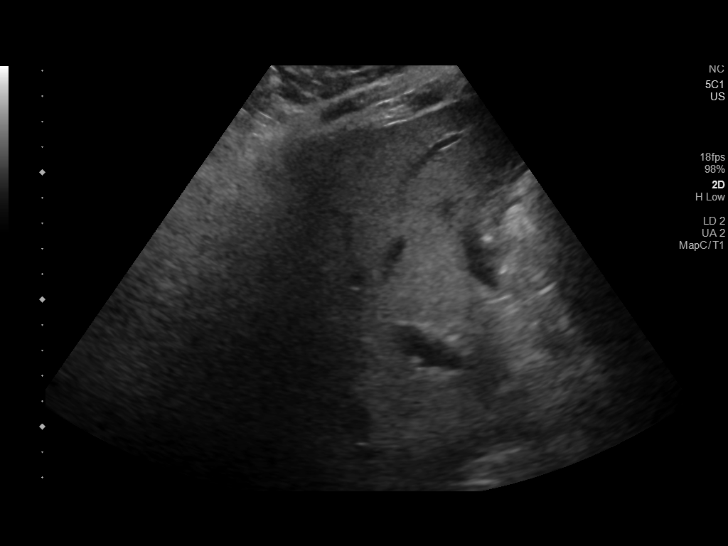

[14 of 25 positions shown; findings below may reference images not displayed]

FINDINGS: Gallbladder:

No gallstones or wall thickening visualized. No sonographic Murphy
sign noted by sonographer.

Common bile duct:

Diameter: 2.3 mm

Liver:

3.2 x 3.1 x 2.3 cm hyperechoic mass adjacent to the gallbladder
fossa without internal Doppler flow. Increased hepatic parenchymal
echogenicity. Portal vein is patent on color Doppler imaging with
normal direction of blood flow towards the liver.
IMPRESSION: 1. Increased hepatic parenchymal echogenicity as can be seen with
hepatic steatosis.
2. 3.2 x 3.1 x 2.3 cm hyperechoic mass adjacent to the gallbladder
fossa without internal Doppler flow. Finding is most compatible with
hemangioma versus more focal fatty deposition.

## 2021-09-23 DIAGNOSIS — G629 Polyneuropathy, unspecified: Secondary | ICD-10-CM | POA: Diagnosis not present

## 2021-09-23 DIAGNOSIS — E782 Mixed hyperlipidemia: Secondary | ICD-10-CM | POA: Diagnosis not present

## 2021-09-23 DIAGNOSIS — I10 Essential (primary) hypertension: Secondary | ICD-10-CM | POA: Diagnosis not present

## 2021-09-23 DIAGNOSIS — K219 Gastro-esophageal reflux disease without esophagitis: Secondary | ICD-10-CM | POA: Diagnosis not present

## 2021-09-23 DIAGNOSIS — E1169 Type 2 diabetes mellitus with other specified complication: Secondary | ICD-10-CM | POA: Diagnosis not present

## 2022-01-28 DIAGNOSIS — E782 Mixed hyperlipidemia: Secondary | ICD-10-CM | POA: Diagnosis not present

## 2022-01-28 DIAGNOSIS — I10 Essential (primary) hypertension: Secondary | ICD-10-CM | POA: Diagnosis not present

## 2022-01-28 DIAGNOSIS — E1169 Type 2 diabetes mellitus with other specified complication: Secondary | ICD-10-CM | POA: Diagnosis not present

## 2022-05-12 DIAGNOSIS — E1169 Type 2 diabetes mellitus with other specified complication: Secondary | ICD-10-CM | POA: Diagnosis not present

## 2022-05-12 DIAGNOSIS — I10 Essential (primary) hypertension: Secondary | ICD-10-CM | POA: Diagnosis not present

## 2022-05-12 DIAGNOSIS — Z6834 Body mass index (BMI) 34.0-34.9, adult: Secondary | ICD-10-CM | POA: Diagnosis not present

## 2022-10-31 DIAGNOSIS — E782 Mixed hyperlipidemia: Secondary | ICD-10-CM | POA: Diagnosis not present

## 2022-10-31 DIAGNOSIS — I10 Essential (primary) hypertension: Secondary | ICD-10-CM | POA: Diagnosis not present

## 2022-10-31 DIAGNOSIS — Z0000 Encounter for general adult medical examination without abnormal findings: Secondary | ICD-10-CM | POA: Diagnosis not present

## 2022-10-31 DIAGNOSIS — K219 Gastro-esophageal reflux disease without esophagitis: Secondary | ICD-10-CM | POA: Diagnosis not present

## 2022-10-31 DIAGNOSIS — E1169 Type 2 diabetes mellitus with other specified complication: Secondary | ICD-10-CM | POA: Diagnosis not present

## 2023-09-21 ENCOUNTER — Encounter (HOSPITAL_COMMUNITY): Payer: Self-pay

## 2023-09-21 ENCOUNTER — Emergency Department (HOSPITAL_COMMUNITY)
Admission: EM | Admit: 2023-09-21 | Discharge: 2023-09-21 | Disposition: A | Discharge status: Home / Self Care | Payer: Federal, State, Local not specified - PPO | Attending: Emergency Medicine | Admitting: Emergency Medicine

## 2023-09-21 ENCOUNTER — Other Ambulatory Visit: Payer: Self-pay

## 2023-09-21 ENCOUNTER — Ambulatory Visit (HOSPITAL_COMMUNITY): Admission: RE | Admit: 2023-09-21 | Payer: Federal, State, Local not specified - PPO | Source: Ambulatory Visit

## 2023-09-21 DIAGNOSIS — K047 Periapical abscess without sinus: Secondary | ICD-10-CM | POA: Diagnosis not present

## 2023-09-21 DIAGNOSIS — Z79899 Other long term (current) drug therapy: Secondary | ICD-10-CM | POA: Insufficient documentation

## 2023-09-21 DIAGNOSIS — K0889 Other specified disorders of teeth and supporting structures: Secondary | ICD-10-CM | POA: Diagnosis not present

## 2023-09-21 DIAGNOSIS — I10 Essential (primary) hypertension: Secondary | ICD-10-CM | POA: Diagnosis not present

## 2023-09-21 DIAGNOSIS — E119 Type 2 diabetes mellitus without complications: Secondary | ICD-10-CM | POA: Diagnosis not present

## 2023-09-21 MED ORDER — AMOXICILLIN 500 MG PO CAPS
500.0000 mg | ORAL_CAPSULE | Freq: Once | ORAL | Status: AC
  Administered 2023-09-21: 500 mg via ORAL
  Filled 2023-09-21: qty 1

## 2023-09-21 MED ORDER — AMOXICILLIN 500 MG PO CAPS: 500.0000 mg | ORAL_CAPSULE | Freq: Three times a day (TID) | ORAL | 0 refills | Status: DC

## 2023-09-21 MED ORDER — NAPROXEN 500 MG PO TABS: 500.0000 mg | ORAL_TABLET | Freq: Two times a day (BID) | ORAL | 0 refills | Status: DC | PRN

## 2023-09-21 MED ORDER — OXYCODONE-ACETAMINOPHEN 5-325 MG PO TABS
1.0000 | ORAL_TABLET | Freq: Once | ORAL | Status: AC
  Administered 2023-09-21: 1 via ORAL
  Filled 2023-09-21: qty 1

## 2023-09-21 NOTE — Discharge Instructions (Addendum)
Follow-up with a dentist.  Only a dentist can fix your problem.  We recommend that you take amoxicillin as prescribed to treat likely underlying infection.  Take Naproxen as prescribed for pain control.  Return to the ED for any new or concerning symptoms.

## 2023-09-21 NOTE — ED Triage Notes (Signed)
Pt came in with c/o dental pain. Pt stated a month ago they were having issues with the back of his top gums. Pt was using peroxide and mouth wash that gave minimal relief. Pt was recently eating and chipped his front tooth and is now having pain the back where he had it before. Pt also states he feels a knot on his gums.   Pt has been taking tylenol, aleve and ibuprofen with relief that only last a couple hours

## 2023-09-21 NOTE — ED Provider Notes (Signed)
[place] EMERGENCY DEPARTMENT AT [facility] Provider Note   CSN: [id number] Arrival date & time: 09/21/23  0002     History  Chief Complaint  Patient presents with   Dental Pain    Dakota Becker is a 47 y.o. male.  47 year old male presents to the emergency department for evaluation of dental pain.  He has been experiencing right upper dental pain for the past few weeks.  Was eating a banana chip a few days ago when he chipped his right lower premolar.  States that he has been experiencing increased pain at the site of this chipped tooth as well as worsening pain to his right upper dentition.  He has had some purulence and bleeding in the mouth.  Has tried peroxide mouthwash as well as over-the-counter analgesics with minimal relief.  He states that the pain will return 2 to 3 hours after taking these medications.  No associated fevers.  Has not seen a dentist in many years.  The history is provided by the patient. No language interpreter was used.  Dental Pain      Home Medications Prior to Admission medications   Medication Sig Start Date End Date Taking? Authorizing Provider  amoxicillin (AMOXIL) 500 MG capsule Take 1 capsule (500 mg total) by mouth 3 (three) times daily. 09/21/23  Yes Antony Madura, PA-C  naproxen (NAPROSYN) 500 MG tablet Take 1 tablet (500 mg total) by mouth every 12 (twelve) hours as needed for moderate pain (pain score 4-6) or mild pain (pain score 1-3). 09/21/23  Yes Antony Madura, PA-C  cyclobenzaprine (FLEXERIL) 10 MG tablet Take 1 tablet (10 mg total) by mouth 2 (two) times daily as needed for muscle spasms. Patient not taking: Reported on 12/10/2018 01/01/17   Alvira Monday, MD  pantoprazole (PROTONIX) 20 MG tablet Take 1 tablet (20 mg total) by mouth daily for 30 days. 12/10/18 01/09/19  Arlyn Dunning, PA-C      Allergies    Milk-related compounds    Review of Systems   Review of Systems Ten systems reviewed and are negative for  acute change, except as noted in the HPI.    Physical Exam Updated Vital Signs BP (!) 170/104 (BP Location: Left Arm)   Pulse 83   Temp 98 F (36.7 C) (Oral)   Resp 20   Ht 6\' 1"  (1.854 m)   Wt 109.8 kg   SpO2 99%   BMI 31.93 kg/m   Physical Exam Vitals and nursing note reviewed.  Constitutional:      General: He is not in acute distress.    Appearance: He is well-developed. He is not diaphoretic.     Comments: Nontoxic appearing and in NAD  HENT:     Head: Normocephalic and atraumatic.     Mouth/Throat:      Comments: No trismus or malocclusion. Dental caries noted with fracture to the right lower 1st premolar. There is also a pustule superior to the gingiva between the right upper 2nd premolar and 1st molar. Soft oral floor. No submandibular swelling. Normal phonation. Eyes:     General: No scleral icterus.    Conjunctiva/sclera: Conjunctivae normal.  Pulmonary:     Effort: Pulmonary effort is normal. No respiratory distress.     Comments: Respirations even and unlabored Musculoskeletal:        General: Normal range of motion.     Cervical back: Normal range of motion.  Skin:    General: Skin is warm and dry.  Coloration: Skin is not pale.     Findings: No erythema or rash.  Neurological:     Mental Status: He is alert and oriented to person, place, and time.  Psychiatric:        Behavior: Behavior normal.     ED Results / Procedures / Treatments   Labs (all labs ordered are listed, but only abnormal results are displayed) Labs Reviewed - No data to display  EKG None  Radiology No results found.  Procedures Procedures    Medications Ordered in ED Medications  amoxicillin (AMOXIL) capsule 500 mg (500 mg Oral Given 09/21/23 0039)  oxyCODONE-acetaminophen (PERCOCET/ROXICET) 5-325 MG per tablet 1 tablet (1 tablet Oral Given 09/21/23 0039)    ED Course/ Medical Decision Making/ A&P                                 Medical Decision  Making Risk Prescription drug management.   This patient presents to the ED for concern of dentalgia, this involves an extensive number of treatment options, and is a complaint that carries with it a high risk of complications and morbidity.  The differential diagnosis includes dental fracture vs dental abscess vs gingivitis vs TMJ dysfunction vs trigeminal neuralgia vs Ludwig's angina   Co morbidities that complicate the patient evaluation  DM HTN   Additional history obtained:  Additional history obtained from family at bedside   Cardiac Monitoring:  The patient was maintained on a cardiac monitor.  I personally viewed and interpreted the cardiac monitored which showed an underlying rhythm of: NSR   Medicines ordered and prescription drug management:  I ordered medication including Amoxicillin for infection and Percocet for pain  Reevaluation of the patient after these medicines showed that the patient improved I have reviewed the patients home medicines and have made adjustments as needed   Test Considered:  Panorex   Problem List / ED Course:  Patient with toothache.  No gross abscess.  Exam unconcerning for Ludwig's angina or spread of infection.  Will treat with amoxicillin and pain medicine.  Urged patient to follow-up with dentist.     Reevaluation:  After the interventions noted above, I reevaluated the patient and found that they have :stayed the same   Social Determinants of Health:  Lack of dental follow up   Dispostion:  After consideration of the diagnostic results and the patients response to treatment, I feel that the patent would benefit from course of AMOX w/close dental follow up. Resource guide given. Return precautions discussed and provided. Patient discharged in stable condition with no unaddressed concerns.          Final Clinical Impression(s) / ED Diagnoses Final diagnoses:  Dental abscess    Rx / DC Orders ED Discharge  Orders          Ordered    [facility]OUS        09/21/23 0031    amoxicillin (AMOXIL) 500 MG capsule  3 times daily        09/21/23 0031    naproxen (NAPROSYN) 500 MG tablet  Every 12 hours PRN        09/21/23 0031              Antony Madura, PA-C 09/21/23 0307    Nira Conn, MD 09/21/23 [phone]

## 2023-09-27 DIAGNOSIS — I82409 Acute embolism and thrombosis of unspecified deep veins of unspecified lower extremity: Secondary | ICD-10-CM

## 2023-09-27 HISTORY — DX: Acute embolism and thrombosis of unspecified deep veins of unspecified lower extremity: I82.409

## 2023-12-19 DIAGNOSIS — E1169 Type 2 diabetes mellitus with other specified complication: Secondary | ICD-10-CM | POA: Diagnosis not present

## 2023-12-19 DIAGNOSIS — I10 Essential (primary) hypertension: Secondary | ICD-10-CM | POA: Diagnosis not present

## 2023-12-19 DIAGNOSIS — L089 Local infection of the skin and subcutaneous tissue, unspecified: Secondary | ICD-10-CM | POA: Diagnosis not present

## 2023-12-19 DIAGNOSIS — L98499 Non-pressure chronic ulcer of skin of other sites with unspecified severity: Secondary | ICD-10-CM | POA: Diagnosis not present

## 2023-12-19 DIAGNOSIS — G629 Polyneuropathy, unspecified: Secondary | ICD-10-CM | POA: Diagnosis not present

## 2023-12-19 DIAGNOSIS — E782 Mixed hyperlipidemia: Secondary | ICD-10-CM | POA: Diagnosis not present

## 2023-12-27 ENCOUNTER — Ambulatory Visit: Admitting: Podiatry

## 2023-12-27 ENCOUNTER — Other Ambulatory Visit: Payer: Self-pay | Admitting: Podiatry

## 2023-12-27 ENCOUNTER — Encounter: Payer: Self-pay | Admitting: Podiatry

## 2023-12-27 DIAGNOSIS — L97522 Non-pressure chronic ulcer of other part of left foot with fat layer exposed: Secondary | ICD-10-CM

## 2023-12-27 DIAGNOSIS — E0843 Diabetes mellitus due to underlying condition with diabetic autonomic (poly)neuropathy: Secondary | ICD-10-CM | POA: Diagnosis not present

## 2023-12-27 DIAGNOSIS — S81801A Unspecified open wound, right lower leg, initial encounter: Secondary | ICD-10-CM

## 2023-12-28 NOTE — Progress Notes (Signed)
 Chief Complaint  Patient presents with   Nail Problem    RM#9 Bilateral big toe nail problem states shoes had been rubbing and nails had already been loose created blisters and busted and now down to open raw area on back of right toe has been treating at home with antibiotic ointment.Open wound on right leg since September has gotten worse.    Subjective:  48 y.o. male with PMHx of diabetes mellitus; uncontrolled presenting as a new patient for evaluation of wounds that developed to the bilateral toes.  Patient states that he wore a new pair of work boots which developed blisters to the toes.  He was seen by his PCP who referred him here.  Currently on oral antibiotics.  He states that over the past year he has not managed his diabetes and quit taking medication or monitoring it.  He had recent blood work at his PCP which is currently pending according the patient   Past Medical History:  Diagnosis Date   Diabetes mellitus without complication (HCC)    Hypertension     Past Surgical History:  Procedure Laterality Date   APPENDECTOMY      Allergies  Allergen Reactions   Milk-Related Compounds     Pass gas    RT leg 12/27/2023  LT great toe 12/27/2023  LT foot 12/27/2023  Objective/Physical Exam General: The patient is alert and oriented x3 in no acute distress.  Dermatology:  Wound #1 noted to the left great toe which measures approximately 2.5 x 2.5 x 0.2 cm.  Fibrotic debris with granular wound base noted.  Serous drainage.  No malodor.  No purulence.  There is no exposed bone  Wound #2 also noted to the lateral aspect of the right leg which measures approximately 3.0 x 2.5 x 0.3 cm.  Again, fibrotic debris with a granular wound base is noted.  Serous drainage.  No malodor.  No purulence.  Vascular: Palpable pedal pulses bilaterally. No edema or erythema noted.  Skin is warm to touch.  Clinically no concern for vascular compromise  Neurological: Light touch and protective  threshold diminished bilaterally.   Musculoskeletal Exam: Range of motion within normal limits to all pedal and ankle joints bilateral. Muscle strength 5/5 in all groups bilateral.  Patient ambulatory.  No history of prior amputations  Assessment: 1.  Ulcers left great toe and right leg secondary to diabetes mellitus 2. diabetes mellitus w/ peripheral neuropathy; uncontrolled   Plan of Care:  -Patient was evaluated. -Medically necessary excisional debridement including subcutaneous tissue was performed using a tissue nipper and a chisel blade. Excisional debridement of all the necrotic nonviable tissue down to healthy bleeding viable tissue was performed with post-debridement measurements same as pre-. -The wound was cleansed and dry sterile dressing applied. -For now recommend Betadine with gauze daily.  Supplies provided -Referral placed for the patient to go to the [facility] wound care center.  -Wound culture was also taken and sent to pathology for culture and sensitivity of the left great toe wound -Currently on doxycycline 100 mg twice daily x 14 days as prescribed by PCP.  Continue -Return to clinic with me in 2 weeks since the wound care center traditionally has been getting new patient appts for about 4 weeks out.    Felecia Shelling, DPM [facility]  Dr. Felecia Shelling, DPM    [address]  [place], [place] [address]                Office [phone]  Fax [phone]

## 2023-12-30 LAB — WOUND CULTURE
MICRO NUMBER:: 16281825
SPECIMEN QUALITY:: ADEQUATE

## 2023-12-30 LAB — HOUSE ACCOUNT TRACKING

## 2024-01-03 DIAGNOSIS — M79669 Pain in unspecified lower leg: Secondary | ICD-10-CM | POA: Diagnosis not present

## 2024-01-05 ENCOUNTER — Ambulatory Visit (HOSPITAL_COMMUNITY)
Admission: RE | Admit: 2024-01-05 | Discharge: 2024-01-05 | Disposition: A | Discharge status: Home / Self Care | Source: Ambulatory Visit | Attending: Cardiology | Admitting: Cardiology

## 2024-01-05 ENCOUNTER — Other Ambulatory Visit (HOSPITAL_COMMUNITY): Payer: Self-pay | Admitting: Family Medicine

## 2024-01-05 ENCOUNTER — Encounter (HOSPITAL_COMMUNITY): Payer: Self-pay

## 2024-01-05 DIAGNOSIS — M79662 Pain in left lower leg: Secondary | ICD-10-CM | POA: Insufficient documentation

## 2024-01-05 DIAGNOSIS — M79661 Pain in right lower leg: Secondary | ICD-10-CM

## 2024-01-15 ENCOUNTER — Telehealth: Payer: Self-pay

## 2024-01-15 ENCOUNTER — Ambulatory Visit: Admitting: Podiatry

## 2024-01-15 NOTE — Telephone Encounter (Signed)
 Patient called regarding referral to wound care center.I do not see that there was a referral placed.

## 2024-01-16 NOTE — Addendum Note (Signed)
 Addended by: Dot Gazella on: 01/16/2024 12:23 PM   Modules accepted: Orders

## 2024-01-27 ENCOUNTER — Encounter (HOSPITAL_COMMUNITY)
Admission: EM | Disposition: A | Discharge status: Home / Self Care | Payer: Self-pay | Source: Ambulatory Visit | Attending: Internal Medicine

## 2024-01-27 ENCOUNTER — Emergency Department (HOSPITAL_BASED_OUTPATIENT_CLINIC_OR_DEPARTMENT_OTHER)

## 2024-01-27 ENCOUNTER — Other Ambulatory Visit: Payer: Self-pay

## 2024-01-27 ENCOUNTER — Encounter (HOSPITAL_BASED_OUTPATIENT_CLINIC_OR_DEPARTMENT_OTHER): Payer: Self-pay | Admitting: Emergency Medicine

## 2024-01-27 ENCOUNTER — Inpatient Hospital Stay (HOSPITAL_COMMUNITY): Admitting: Anesthesiology

## 2024-01-27 ENCOUNTER — Inpatient Hospital Stay (HOSPITAL_COMMUNITY)

## 2024-01-27 ENCOUNTER — Inpatient Hospital Stay (HOSPITAL_BASED_OUTPATIENT_CLINIC_OR_DEPARTMENT_OTHER)
Admission: EM | Admit: 2024-01-27 | Discharge: 2024-02-08 | DRG: 853 | Disposition: A | Discharge status: Home / Self Care | Source: Ambulatory Visit | Attending: Family Medicine | Admitting: Family Medicine

## 2024-01-27 DIAGNOSIS — E11628 Type 2 diabetes mellitus with other skin complications: Secondary | ICD-10-CM | POA: Diagnosis not present

## 2024-01-27 DIAGNOSIS — Z23 Encounter for immunization: Secondary | ICD-10-CM | POA: Diagnosis not present

## 2024-01-27 DIAGNOSIS — R110 Nausea: Secondary | ICD-10-CM

## 2024-01-27 DIAGNOSIS — E785 Hyperlipidemia, unspecified: Secondary | ICD-10-CM | POA: Diagnosis present

## 2024-01-27 DIAGNOSIS — A408 Other streptococcal sepsis: Principal | ICD-10-CM | POA: Diagnosis present

## 2024-01-27 DIAGNOSIS — I10 Essential (primary) hypertension: Secondary | ICD-10-CM | POA: Diagnosis not present

## 2024-01-27 DIAGNOSIS — A400 Sepsis due to streptococcus, group A: Secondary | ICD-10-CM | POA: Diagnosis present

## 2024-01-27 DIAGNOSIS — L97919 Non-pressure chronic ulcer of unspecified part of right lower leg with unspecified severity: Secondary | ICD-10-CM | POA: Diagnosis present

## 2024-01-27 DIAGNOSIS — E66811 Obesity, class 1: Secondary | ICD-10-CM | POA: Diagnosis present

## 2024-01-27 DIAGNOSIS — L600 Ingrowing nail: Secondary | ICD-10-CM | POA: Diagnosis present

## 2024-01-27 DIAGNOSIS — B952 Enterococcus as the cause of diseases classified elsewhere: Secondary | ICD-10-CM | POA: Diagnosis present

## 2024-01-27 DIAGNOSIS — M79672 Pain in left foot: Secondary | ICD-10-CM | POA: Diagnosis present

## 2024-01-27 DIAGNOSIS — K5900 Constipation, unspecified: Secondary | ICD-10-CM | POA: Diagnosis not present

## 2024-01-27 DIAGNOSIS — Z9889 Other specified postprocedural states: Secondary | ICD-10-CM | POA: Diagnosis not present

## 2024-01-27 DIAGNOSIS — B9689 Other specified bacterial agents as the cause of diseases classified elsewhere: Secondary | ICD-10-CM | POA: Diagnosis not present

## 2024-01-27 DIAGNOSIS — Z79899 Other long term (current) drug therapy: Secondary | ICD-10-CM

## 2024-01-27 DIAGNOSIS — Z91148 Patient's other noncompliance with medication regimen for other reason: Secondary | ICD-10-CM

## 2024-01-27 DIAGNOSIS — M879 Osteonecrosis, unspecified: Secondary | ICD-10-CM | POA: Diagnosis present

## 2024-01-27 DIAGNOSIS — M726 Necrotizing fasciitis: Secondary | ICD-10-CM | POA: Diagnosis present

## 2024-01-27 DIAGNOSIS — A480 Gas gangrene: Secondary | ICD-10-CM | POA: Diagnosis present

## 2024-01-27 DIAGNOSIS — L97509 Non-pressure chronic ulcer of other part of unspecified foot with unspecified severity: Secondary | ICD-10-CM | POA: Diagnosis present

## 2024-01-27 DIAGNOSIS — E1152 Type 2 diabetes mellitus with diabetic peripheral angiopathy with gangrene: Secondary | ICD-10-CM | POA: Diagnosis not present

## 2024-01-27 DIAGNOSIS — I96 Gangrene, not elsewhere classified: Principal | ICD-10-CM | POA: Diagnosis present

## 2024-01-27 DIAGNOSIS — M79662 Pain in left lower leg: Secondary | ICD-10-CM | POA: Diagnosis present

## 2024-01-27 DIAGNOSIS — Z794 Long term (current) use of insulin: Secondary | ICD-10-CM

## 2024-01-27 DIAGNOSIS — M7989 Other specified soft tissue disorders: Secondary | ICD-10-CM | POA: Diagnosis not present

## 2024-01-27 DIAGNOSIS — R600 Localized edema: Secondary | ICD-10-CM | POA: Diagnosis not present

## 2024-01-27 DIAGNOSIS — E1169 Type 2 diabetes mellitus with other specified complication: Secondary | ICD-10-CM | POA: Diagnosis present

## 2024-01-27 DIAGNOSIS — T797XXA Traumatic subcutaneous emphysema, initial encounter: Secondary | ICD-10-CM | POA: Diagnosis present

## 2024-01-27 DIAGNOSIS — R7881 Bacteremia: Secondary | ICD-10-CM

## 2024-01-27 DIAGNOSIS — Z6832 Body mass index (BMI) 32.0-32.9, adult: Secondary | ICD-10-CM | POA: Diagnosis not present

## 2024-01-27 DIAGNOSIS — I38 Endocarditis, valve unspecified: Secondary | ICD-10-CM | POA: Diagnosis not present

## 2024-01-27 DIAGNOSIS — Z833 Family history of diabetes mellitus: Secondary | ICD-10-CM

## 2024-01-27 DIAGNOSIS — E119 Type 2 diabetes mellitus without complications: Secondary | ICD-10-CM

## 2024-01-27 DIAGNOSIS — I82402 Acute embolism and thrombosis of unspecified deep veins of left lower extremity: Secondary | ICD-10-CM

## 2024-01-27 DIAGNOSIS — Z7984 Long term (current) use of oral hypoglycemic drugs: Secondary | ICD-10-CM

## 2024-01-27 DIAGNOSIS — L089 Local infection of the skin and subcutaneous tissue, unspecified: Secondary | ICD-10-CM | POA: Diagnosis not present

## 2024-01-27 DIAGNOSIS — E1165 Type 2 diabetes mellitus with hyperglycemia: Secondary | ICD-10-CM | POA: Diagnosis present

## 2024-01-27 DIAGNOSIS — M869 Osteomyelitis, unspecified: Secondary | ICD-10-CM | POA: Diagnosis not present

## 2024-01-27 DIAGNOSIS — E872 Acidosis, unspecified: Secondary | ICD-10-CM | POA: Diagnosis not present

## 2024-01-27 DIAGNOSIS — R000 Tachycardia, unspecified: Secondary | ICD-10-CM | POA: Diagnosis not present

## 2024-01-27 DIAGNOSIS — L0889 Other specified local infections of the skin and subcutaneous tissue: Secondary | ICD-10-CM | POA: Diagnosis not present

## 2024-01-27 DIAGNOSIS — I70262 Atherosclerosis of native arteries of extremities with gangrene, left leg: Secondary | ICD-10-CM | POA: Diagnosis not present

## 2024-01-27 DIAGNOSIS — M86172 Other acute osteomyelitis, left ankle and foot: Secondary | ICD-10-CM | POA: Diagnosis not present

## 2024-01-27 HISTORY — PX: AMPUTATION TOE: SHX6595

## 2024-01-27 HISTORY — DX: Hyperlipidemia, unspecified: E78.5

## 2024-01-27 HISTORY — PX: AMPUTATION: SHX166

## 2024-01-27 LAB — COMPREHENSIVE METABOLIC PANEL WITH GFR
ALT: 18 U/L (ref 0–44)
AST: 34 U/L (ref 15–41)
Albumin: 3.8 g/dL (ref 3.5–5.0)
Alkaline Phosphatase: 191 U/L — ABNORMAL HIGH (ref 38–126)
Anion gap: 24 — ABNORMAL HIGH (ref 5–15)
BUN: 12 mg/dL (ref 6–20)
CO2: 19 mmol/L — ABNORMAL LOW (ref 22–32)
Calcium: 10.4 mg/dL — ABNORMAL HIGH (ref 8.9–10.3)
Chloride: 93 mmol/L — ABNORMAL LOW (ref 98–111)
Creatinine, Ser: 0.98 mg/dL (ref 0.61–1.24)
GFR, Estimated: >60 mL/min (ref >60)
Glucose, Bld: 193 mg/dL — ABNORMAL HIGH (ref 70–99)
Potassium: 3.9 mmol/L (ref 3.5–5.1)
Sodium: 136 mmol/L (ref 135–145)
Total Bilirubin: 1 mg/dL (ref 0.0–1.2)
Total Protein: 8 g/dL (ref 6.5–8.1)

## 2024-01-27 LAB — LACTIC ACID, PLASMA
Lactic Acid, Venous: 2.4 mmol/L (ref 0.5–1.9)
Lactic Acid, Venous: 1.5 mmol/L (ref 0.5–1.9)

## 2024-01-27 LAB — CBC WITH DIFFERENTIAL/PLATELET
Abs Immature Granulocytes: 0.2 10*3/uL — ABNORMAL HIGH (ref 0.00–0.07)
Basophils Absolute: 0 10*3/uL (ref 0.0–0.1)
Basophils Relative: 0 %
Eosinophils Absolute: 0 10*3/uL (ref 0.0–0.5)
Eosinophils Relative: 0 %
HCT: 42.2 % (ref 39.0–52.0)
Hemoglobin: 14.3 g/dL (ref 13.0–17.0)
Immature Granulocytes: 1 %
Lymphocytes Relative: 3 %
Lymphs Abs: 0.5 10*3/uL — ABNORMAL LOW (ref 0.7–4.0)
MCH: 29.8 pg (ref 26.0–34.0)
MCHC: 33.9 g/dL (ref 30.0–36.0)
MCV: 87.9 fL (ref 80.0–100.0)
Monocytes Absolute: 0.5 10*3/uL (ref 0.1–1.0)
Monocytes Relative: 3 %
Neutro Abs: 16.4 10*3/uL — ABNORMAL HIGH (ref 1.7–7.7)
Neutrophils Relative %: 93 %
Platelets: 468 10*3/uL — ABNORMAL HIGH (ref 150–400)
RBC: 4.8 MIL/uL (ref 4.22–5.81)
RDW: 12.4 % (ref 11.5–15.5)
WBC: 17.6 10*3/uL — ABNORMAL HIGH (ref 4.0–10.5)
nRBC: 0 % (ref 0.0–0.2)

## 2024-01-27 LAB — GLUCOSE, CAPILLARY
Glucose-Capillary: 205 mg/dL — ABNORMAL HIGH (ref 70–99)
Glucose-Capillary: 186 mg/dL — ABNORMAL HIGH (ref 70–99)
Glucose-Capillary: 161 mg/dL — ABNORMAL HIGH (ref 70–99)

## 2024-01-27 LAB — HIV ANTIBODY (ROUTINE TESTING W REFLEX): HIV Screen 4th Generation wRfx: NONREACTIVE

## 2024-01-27 LAB — HEMOGLOBIN A1C
Hgb A1c MFr Bld: 9.8 % — ABNORMAL HIGH (ref 4.8–5.6)
Mean Plasma Glucose: 234.56 mg/dL

## 2024-01-27 SURGERY — AMPUTATION, FOOT, RAY
Anesthesia: General | Site: Toe | Laterality: Left

## 2024-01-27 MED ORDER — HYDROMORPHONE HCL 1 MG/ML IJ SOLN
INTRAMUSCULAR | Status: AC
  Filled 2024-01-27: qty 0.5

## 2024-01-27 MED ORDER — CHLORHEXIDINE GLUCONATE 0.12 % MT SOLN
OROMUCOSAL | Status: AC
  Administered 2024-01-27: 15 mL via OROMUCOSAL
  Filled 2024-01-27: qty 15

## 2024-01-27 MED ORDER — HYDROMORPHONE HCL 1 MG/ML IJ SOLN
INTRAMUSCULAR | Status: DC | PRN
  Administered 2024-01-27: .5 mg via INTRAVENOUS

## 2024-01-27 MED ORDER — DEXMEDETOMIDINE HCL IN NACL 80 MCG/20ML IV SOLN
INTRAVENOUS | Status: DC | PRN
  Administered 2024-01-27: 8 ug via INTRAVENOUS

## 2024-01-27 MED ORDER — ORAL CARE MOUTH RINSE: 15.0000 mL | OROMUCOSAL | Status: DC | PRN

## 2024-01-27 MED ORDER — ACETAMINOPHEN 10 MG/ML IV SOLN
INTRAVENOUS | Status: DC | PRN
  Administered 2024-01-27: 1000 mg via INTRAVENOUS

## 2024-01-27 MED ORDER — VANCOMYCIN HCL IN DEXTROSE 1-5 GM/200ML-% IV SOLN
1000.0000 mg | Freq: Once | INTRAVENOUS | Status: DC
  Filled 2024-01-27: qty 200

## 2024-01-27 MED ORDER — DEXMEDETOMIDINE HCL IN NACL 80 MCG/20ML IV SOLN
INTRAVENOUS | Status: AC
Start: 2024-01-27 — End: ?
  Filled 2024-01-27: qty 20

## 2024-01-27 MED ORDER — PROPOFOL 10 MG/ML IV BOLUS
INTRAVENOUS | Status: AC
  Filled 2024-01-27: qty 20

## 2024-01-27 MED ORDER — INSULIN ASPART 100 UNIT/ML IJ SOLN
INTRAMUSCULAR | Status: AC
Start: 2024-01-27 — End: 2024-01-28
  Filled 2024-01-27: qty 1

## 2024-01-27 MED ORDER — VANCOMYCIN HCL 500 MG IV SOLR
INTRAVENOUS | Status: DC | PRN
  Administered 2024-01-27: 500 mg

## 2024-01-27 MED ORDER — PROPOFOL 10 MG/ML IV BOLUS
INTRAVENOUS | Status: DC | PRN
  Administered 2024-01-27: 50 mg via INTRAVENOUS
  Administered 2024-01-27: 200 mg via INTRAVENOUS

## 2024-01-27 MED ORDER — TOBRAMYCIN SULFATE 80 MG/2ML IJ SOLN
INTRAMUSCULAR | Status: AC
  Filled 2024-01-27: qty 2

## 2024-01-27 MED ORDER — SUCCINYLCHOLINE CHLORIDE 200 MG/10ML IV SOSY
PREFILLED_SYRINGE | INTRAVENOUS | Status: DC | PRN
  Administered 2024-01-27: 120 mg via INTRAVENOUS

## 2024-01-27 MED ORDER — GENTAMICIN SULFATE 40 MG/ML IJ SOLN
INTRAMUSCULAR | Status: AC
  Filled 2024-01-27: qty 4

## 2024-01-27 MED ORDER — SODIUM CHLORIDE 0.9 % IV SOLN: 12.5000 mg | INTRAVENOUS | Status: DC | PRN

## 2024-01-27 MED ORDER — LACTATED RINGERS IV SOLN
150.0000 mL/h | INTRAVENOUS | Status: AC
  Administered 2024-01-27 – 2024-01-28 (×3): 150 mL/h via INTRAVENOUS

## 2024-01-27 MED ORDER — OXYCODONE HCL 5 MG PO TABS: 5.0000 mg | ORAL_TABLET | Freq: Once | ORAL | Status: DC | PRN

## 2024-01-27 MED ORDER — FENTANYL CITRATE PF 50 MCG/ML IJ SOSY
50.0000 ug | PREFILLED_SYRINGE | Freq: Once | INTRAMUSCULAR | Status: AC
  Administered 2024-01-27: 50 ug via INTRAVENOUS
  Filled 2024-01-27: qty 1

## 2024-01-27 MED ORDER — HYDROMORPHONE HCL 1 MG/ML IJ SOLN
0.5000 mg | Freq: Once | INTRAMUSCULAR | Status: AC
  Administered 2024-01-27: 0.5 mg via INTRAVENOUS
  Filled 2024-01-27: qty 1

## 2024-01-27 MED ORDER — ACETAMINOPHEN 325 MG PO TABS
650.0000 mg | ORAL_TABLET | Freq: Four times a day (QID) | ORAL | Status: DC | PRN
  Administered 2024-01-28 – 2024-02-05 (×3): 650 mg via ORAL
  Filled 2024-01-27 (×3): qty 2

## 2024-01-27 MED ORDER — ACETAMINOPHEN 10 MG/ML IV SOLN
INTRAVENOUS | Status: AC
  Filled 2024-01-27: qty 100

## 2024-01-27 MED ORDER — SODIUM CHLORIDE 0.9 % IR SOLN
Status: DC | PRN
  Administered 2024-01-27: 1000 mL

## 2024-01-27 MED ORDER — TOBRAMYCIN SULFATE 1.2 G IJ SOLR
INTRAMUSCULAR | Status: AC
  Filled 2024-01-27: qty 1.2

## 2024-01-27 MED ORDER — LACTATED RINGERS IV SOLN
INTRAVENOUS | Status: DC
Start: 2024-01-27 — End: 2024-01-27

## 2024-01-27 MED ORDER — SODIUM CHLORIDE 0.9 % IV SOLN
3.0000 g | Freq: Once | INTRAVENOUS | Status: DC
  Administered 2024-01-27: 3 g via INTRAVENOUS

## 2024-01-27 MED ORDER — PNEUMOCOCCAL 20-VAL CONJ VACC 0.5 ML IM SUSY
0.5000 mL | PREFILLED_SYRINGE | INTRAMUSCULAR | Status: DC
  Filled 2024-01-27: qty 0.5

## 2024-01-27 MED ORDER — INSULIN ASPART 100 UNIT/ML IJ SOLN
0.0000 [IU] | Freq: Three times a day (TID) | INTRAMUSCULAR | Status: DC
  Administered 2024-01-28: 3 [IU] via SUBCUTANEOUS
  Administered 2024-01-28: 2 [IU] via SUBCUTANEOUS
  Administered 2024-01-28: 3 [IU] via SUBCUTANEOUS
  Administered 2024-01-29: 2 [IU] via SUBCUTANEOUS
  Administered 2024-01-29: 8 [IU] via SUBCUTANEOUS
  Administered 2024-01-30: 3 [IU] via SUBCUTANEOUS
  Administered 2024-01-30 – 2024-01-31 (×5): 5 [IU] via SUBCUTANEOUS
  Administered 2024-02-01: 8 [IU] via SUBCUTANEOUS
  Administered 2024-02-01: 5 [IU] via SUBCUTANEOUS
  Administered 2024-02-01 – 2024-02-02 (×2): 8 [IU] via SUBCUTANEOUS
  Administered 2024-02-02: 3 [IU] via SUBCUTANEOUS
  Administered 2024-02-02 – 2024-02-04 (×5): 8 [IU] via SUBCUTANEOUS
  Administered 2024-02-04: 11 [IU] via SUBCUTANEOUS
  Administered 2024-02-04 – 2024-02-05 (×2): 8 [IU] via SUBCUTANEOUS
  Administered 2024-02-05 (×2): 5 [IU] via SUBCUTANEOUS
  Administered 2024-02-06: 8 [IU] via SUBCUTANEOUS
  Administered 2024-02-06: 3 [IU] via SUBCUTANEOUS
  Administered 2024-02-06: 11 [IU] via SUBCUTANEOUS
  Administered 2024-02-07: 8 [IU] via SUBCUTANEOUS
  Administered 2024-02-07: 3 [IU] via SUBCUTANEOUS
  Administered 2024-02-07: 8 [IU] via SUBCUTANEOUS
  Administered 2024-02-08 (×2): 3 [IU] via SUBCUTANEOUS

## 2024-01-27 MED ORDER — ACETAMINOPHEN 650 MG RE SUPP: 650.0000 mg | Freq: Once | RECTAL | Status: DC

## 2024-01-27 MED ORDER — VANCOMYCIN HCL 500 MG IV SOLR
INTRAVENOUS | Status: AC
  Filled 2024-01-27: qty 10

## 2024-01-27 MED ORDER — FENTANYL CITRATE (PF) 250 MCG/5ML IJ SOLN
INTRAMUSCULAR | Status: DC | PRN
Start: 2024-01-27 — End: 2024-01-27
  Administered 2024-01-27 (×2): 50 ug via INTRAVENOUS

## 2024-01-27 MED ORDER — CHLORHEXIDINE GLUCONATE 0.12 % MT SOLN: 15.0000 mL | Freq: Once | OROMUCOSAL | Status: AC

## 2024-01-27 MED ORDER — ORAL CARE MOUTH RINSE: 15.0000 mL | Freq: Once | OROMUCOSAL | Status: AC

## 2024-01-27 MED ORDER — PHENYLEPHRINE 80 MCG/ML (10ML) SYRINGE FOR IV PUSH (FOR BLOOD PRESSURE SUPPORT)
PREFILLED_SYRINGE | INTRAVENOUS | Status: DC | PRN
  Administered 2024-01-27: 160 ug via INTRAVENOUS
  Administered 2024-01-27: 80 ug via INTRAVENOUS

## 2024-01-27 MED ORDER — LINEZOLID 600 MG/300ML IV SOLN
600.0000 mg | Freq: Two times a day (BID) | INTRAVENOUS | Status: DC
  Administered 2024-01-27 – 2024-01-29 (×5): 600 mg via INTRAVENOUS
  Filled 2024-01-27 (×5): qty 300

## 2024-01-27 MED ORDER — BUPIVACAINE HCL (PF) 0.5 % IJ SOLN
INTRAMUSCULAR | Status: DC | PRN
  Administered 2024-01-27: 20 mL

## 2024-01-27 MED ORDER — TOBRAMYCIN SULFATE 80 MG/2ML IJ SOLN
INTRAMUSCULAR | Status: DC | PRN
  Administered 2024-01-27: 120 mg

## 2024-01-27 MED ORDER — FENTANYL CITRATE (PF) 100 MCG/2ML IJ SOLN: 25.0000 ug | INTRAMUSCULAR | Status: DC | PRN

## 2024-01-27 MED ORDER — INSULIN ASPART 100 UNIT/ML IJ SOLN
0.0000 [IU] | INTRAMUSCULAR | Status: DC | PRN
  Administered 2024-01-27: 4 [IU] via SUBCUTANEOUS

## 2024-01-27 MED ORDER — INSULIN ASPART 100 UNIT/ML IJ SOLN
0.0000 [IU] | Freq: Every day | INTRAMUSCULAR | Status: DC
  Administered 2024-01-30: 3 [IU] via SUBCUTANEOUS
  Administered 2024-01-31: 2 [IU] via SUBCUTANEOUS
  Administered 2024-02-01 – 2024-02-02 (×2): 3 [IU] via SUBCUTANEOUS
  Administered 2024-02-03: 2 [IU] via SUBCUTANEOUS
  Administered 2024-02-04 – 2024-02-05 (×2): 3 [IU] via SUBCUTANEOUS
  Administered 2024-02-06: 2 [IU] via SUBCUTANEOUS

## 2024-01-27 MED ORDER — VANCOMYCIN HCL IN DEXTROSE 1-5 GM/200ML-% IV SOLN: 1000.0000 mg | Freq: Once | INTRAVENOUS | Status: DC

## 2024-01-27 MED ORDER — PIPERACILLIN-TAZOBACTAM 3.375 G IVPB 30 MIN
3.3750 g | Freq: Once | INTRAVENOUS | Status: AC
  Administered 2024-01-27: 3.375 g via INTRAVENOUS
  Filled 2024-01-27: qty 50

## 2024-01-27 MED ORDER — MIDAZOLAM HCL 2 MG/2ML IJ SOLN
INTRAMUSCULAR | Status: AC
  Filled 2024-01-27: qty 2

## 2024-01-27 MED ORDER — ONDANSETRON HCL 4 MG/2ML IJ SOLN
INTRAMUSCULAR | Status: DC | PRN
  Administered 2024-01-27: 4 mg via INTRAVENOUS

## 2024-01-27 MED ORDER — OXYCODONE HCL 5 MG/5ML PO SOLN: 5.0000 mg | Freq: Once | ORAL | Status: DC | PRN

## 2024-01-27 MED ORDER — ACETAMINOPHEN 650 MG RE SUPP: 650.0000 mg | Freq: Four times a day (QID) | RECTAL | Status: DC | PRN

## 2024-01-27 MED ORDER — FENTANYL CITRATE (PF) 100 MCG/2ML IJ SOLN
INTRAMUSCULAR | Status: AC
Start: 2024-01-27 — End: 2024-01-27
  Filled 2024-01-27: qty 2

## 2024-01-27 MED ORDER — LIDOCAINE 2% (20 MG/ML) 5 ML SYRINGE
INTRAMUSCULAR | Status: DC | PRN
  Administered 2024-01-27: 60 mg via INTRAVENOUS

## 2024-01-27 MED ORDER — AMISULPRIDE (ANTIEMETIC) 5 MG/2ML IV SOLN: 10.0000 mg | Freq: Once | INTRAVENOUS | Status: DC | PRN

## 2024-01-27 MED ORDER — PROPOFOL 500 MG/50ML IV EMUL
INTRAVENOUS | Status: DC | PRN
  Administered 2024-01-27: 50 ug/kg/min via INTRAVENOUS

## 2024-01-27 MED ORDER — LINEZOLID 600 MG/300ML IV SOLN: 600.0000 mg | Freq: Two times a day (BID) | INTRAVENOUS | Status: DC

## 2024-01-27 MED ORDER — ONDANSETRON HCL 4 MG/2ML IJ SOLN
4.0000 mg | Freq: Once | INTRAMUSCULAR | Status: AC
  Administered 2024-01-27: 4 mg via INTRAVENOUS
  Filled 2024-01-27: qty 2

## 2024-01-27 MED ORDER — LACTATED RINGERS IV BOLUS
2000.0000 mL | Freq: Once | INTRAVENOUS | Status: AC
Start: 2024-01-27 — End: 2024-01-27
  Administered 2024-01-27: 2000 mL via INTRAVENOUS

## 2024-01-27 MED ORDER — MIDAZOLAM HCL 2 MG/2ML IJ SOLN
INTRAMUSCULAR | Status: DC | PRN
  Administered 2024-01-27: 2 mg via INTRAVENOUS

## 2024-01-27 SURGICAL SUPPLY — 37 items
BLADE AVERAGE 25X9 (BLADE) IMPLANT
BLADE SURG 10 STRL SS (BLADE) ×2 IMPLANT
BLADE SURG 15 STRL LF DISP TIS (BLADE) ×2 IMPLANT
BNDG COHESIVE 3X5 TAN ST LF (GAUZE/BANDAGES/DRESSINGS) ×2 IMPLANT
BNDG ELASTIC 3INX 5YD STR LF (GAUZE/BANDAGES/DRESSINGS) ×2 IMPLANT
BNDG ELASTIC 4INX 5YD STR LF (GAUZE/BANDAGES/DRESSINGS) IMPLANT
BNDG ESMARK 4X9 LF (GAUZE/BANDAGES/DRESSINGS) ×2 IMPLANT
BNDG GAUZE DERMACEA FLUFF 4 (GAUZE/BANDAGES/DRESSINGS) IMPLANT
CHLORAPREP W/TINT 26 (MISCELLANEOUS) IMPLANT
DRSG ADAPTIC 3X8 NADH LF (GAUZE/BANDAGES/DRESSINGS) IMPLANT
DRSG XEROFORM 1X8 (GAUZE/BANDAGES/DRESSINGS) IMPLANT
ELECTRODE REM PT RTRN 9FT ADLT (ELECTROSURGICAL) ×2 IMPLANT
GAUZE PAD ABD 8X10 STRL (GAUZE/BANDAGES/DRESSINGS) IMPLANT
GAUZE SPONGE 2X2 STRL 8-PLY (GAUZE/BANDAGES/DRESSINGS) IMPLANT
GAUZE SPONGE 4X4 12PLY STRL (GAUZE/BANDAGES/DRESSINGS) ×2 IMPLANT
GAUZE STRETCH 2X75IN STRL (MISCELLANEOUS) ×2 IMPLANT
GAUZE XEROFORM 1X8 LF (GAUZE/BANDAGES/DRESSINGS) ×2 IMPLANT
GLOVE BIO SURGEON STRL SZ7.5 (GLOVE) ×2 IMPLANT
GLOVE BIOGEL PI IND STRL 7.5 (GLOVE) ×2 IMPLANT
GOWN STRL REUS W/ TWL LRG LVL3 (GOWN DISPOSABLE) ×4 IMPLANT
KIT BASIN OR (CUSTOM PROCEDURE TRAY) ×2 IMPLANT
KIT STIMULAN RAPID CURE 5CC (Orthopedic Implant) IMPLANT
NDL HYPO 25X1 1.5 SAFETY (NEEDLE) ×2 IMPLANT
NEEDLE HYPO 25X1 1.5 SAFETY (NEEDLE) ×2 IMPLANT
PACK ORTHO EXTREMITY (CUSTOM PROCEDURE TRAY) ×2 IMPLANT
PADDING CAST ABS COTTON 4X4 ST (CAST SUPPLIES) ×4 IMPLANT
SET HNDPC FAN SPRY TIP SCT (DISPOSABLE) IMPLANT
SPIKE FLUID TRANSFER (MISCELLANEOUS) IMPLANT
STOCKINETTE 4X48 STRL (DRAPES) IMPLANT
SUT ETHILON 3 0 FSLX (SUTURE) IMPLANT
SUT PROLENE 3 0 PS 2 (SUTURE) IMPLANT
SUT PROLENE 4 0 PS 2 18 (SUTURE) IMPLANT
SYR CONTROL 10ML LL (SYRINGE) ×2 IMPLANT
TUBE CONNECTING 12X1/4 (SUCTIONS) IMPLANT
UNDERPAD 30X36 HEAVY ABSORB (UNDERPADS AND DIAPERS) ×2 IMPLANT
WATER STERILE IRR 1000ML POUR (IV SOLUTION) ×2 IMPLANT
YANKAUER SUCT BULB TIP NO VENT (SUCTIONS) IMPLANT

## 2024-01-27 NOTE — ED Triage Notes (Signed)
 Fevers/chills/

## 2024-01-27 NOTE — ED Notes (Signed)
 [facility] at bedside

## 2024-01-27 NOTE — ED Triage Notes (Signed)
 Now nauseated, pain to left foot, hard to bear weight.

## 2024-01-27 NOTE — Transfer of Care (Signed)
 Immediate Anesthesia Transfer of Care Note  Patient: Dakota Becker  Procedure(s) Performed: Partial first ray amp, gas gangrene (Left) AMPUTATION 2nd Toe (Left: Toe)  Patient Location: PACU  Anesthesia Type:General  Level of Consciousness: awake, alert , oriented, and drowsy  Airway & Oxygen Therapy: Patient Spontanous Breathing and Patient connected to face mask oxygen  Post-op Assessment: Report given to RN and Post -op Vital signs reviewed and stable  Post vital signs: Reviewed and stable  Last Vitals:  Vitals Value Taken Time  BP 131/75 01/27/24 1845  Temp 37.7 C 01/27/24 1841  Pulse 119 01/27/24 1847  Resp 15 01/27/24 1847  SpO2 94 % 01/27/24 1847  Vitals shown include unfiled device data.  Last Pain:  Vitals:   01/27/24 1841  TempSrc:   PainSc: 0-No pain         Complications: No notable events documented.

## 2024-01-27 NOTE — ED Provider Notes (Signed)
 [place] EMERGENCY DEPARTMENT AT [facility] Provider Note   CSN: [id number] Arrival date & time: 01/27/24  1327     History  Chief Complaint  Patient presents with   Foot Pain   Wound Infection   Code Sepsis    Dakota Becker is a 48 y.o. male with a past medical history of diabetes who presents the emergency department with pain in the left foot.  He has a past medical history of diabetes, he developed an ulcer after getting blisters on the plantar surface of his left great toe at the beginning of April.  The patient had not been taking his diabetes medications to the development of this abscess.  He had seen his problem primary care doctor about the infection and was placed on antibiotics prior to seeing podiatry.  Also noted to have ulceration of his right leg.  And bilateral ingrown toenails which were managed by podiatry.  Patient reports he was supposed to follow-up with wound management but never got called.  He contacted podiatry again and has a follow-up visit with wound care scheduled for later in May.  Patient reports that this past Tuesday, 5 days ago patient began having swelling in the left toe, he began having increased pain and started having shaking chills.  He does not take his blood sugars regularly at home and says that he also had episodes of vomiting.  His wife has been helping to manage noticed that he had extremely foul odor and asked him to come in today.  Patient was seen at the [facility] walk-in clinic and sent immediately to the emergency department today.  He has been managing his wound at home by applying iodine   Foot Pain       Home Medications Prior to Admission medications   Medication Sig Start Date End Date Taking? Authorizing Provider  amoxicillin  (AMOXIL ) 500 MG capsule Take 1 capsule (500 mg total) by mouth 3 (three) times daily. Patient not taking: Reported on 12/27/2023 09/21/23   Carleton Cheek, PA-C  cyclobenzaprine  (FLEXERIL ) 10 MG tablet  Take 1 tablet (10 mg total) by mouth 2 (two) times daily as needed for muscle spasms. Patient not taking: Reported on 12/27/2023 01/01/17   Scarlette Currier, MD  doxycycline (ADOXA) 100 MG tablet Take 100 mg by mouth 2 (two) times daily.    [provider]  gabapentin  (NEURONTIN ) 300 MG capsule Take 300 mg by mouth 3 (three) times daily.    [provider]  naproxen  (NAPROSYN ) 500 MG tablet Take 1 tablet (500 mg total) by mouth every 12 (twelve) hours as needed for moderate pain (pain score 4-6) or mild pain (pain score 1-3). 09/21/23   Carleton Cheek, PA-C  pantoprazole  (PROTONIX ) 20 MG tablet Take 1 tablet (20 mg total) by mouth daily for 30 days. 12/10/18 01/09/19  Annemarie Barry, PA-C      Allergies    Milk-related compounds    Review of Systems   Review of Systems  Physical Exam Updated Vital Signs BP 122/78   Pulse (!) 137   Temp 99.7 F (37.6 C) (Oral)   Resp 13   Wt 106.6 kg   SpO2 98%   BMI 31.00 kg/m  Physical Exam Vitals and nursing note reviewed.  Constitutional:      General: He is not in acute distress.    Appearance: He is well-developed. He is ill-appearing. He is not toxic-appearing or diaphoretic.  HENT:     Head: Normocephalic and atraumatic.  Eyes:     General: No scleral icterus.    Conjunctiva/sclera: Conjunctivae normal.  Cardiovascular:     Rate and Rhythm: Regular rhythm. Tachycardia present.     Heart sounds: Normal heart sounds.  Pulmonary:     Effort: Pulmonary effort is normal. No respiratory distress.     Breath sounds: Normal breath sounds.  Abdominal:     Palpations: Abdomen is soft.     Tenderness: There is no abdominal tenderness.  Musculoskeletal:     Cervical back: Normal range of motion and neck supple.  Feet:     Comments: Left foot is significantly swollen and with unstageable black eschar on the plantar surface of the left great toe, deep, almost violaceous erythema of the left foot extending up the leg with heat and  exquisite tenderness.  He has bounding pulses in the feet bilaterally and the toe on the left has the distinct odor of gangrene. Skin:    General: Skin is warm and dry.  Neurological:     Mental Status: He is alert.  Psychiatric:        Behavior: Behavior normal.       ED Results / Procedures / Treatments   Labs (all labs ordered are listed, but only abnormal results are displayed) Labs Reviewed  CULTURE, BLOOD (ROUTINE X 2)  CULTURE, BLOOD (ROUTINE X 2)  COMPREHENSIVE METABOLIC PANEL WITH GFR  CBC WITH DIFFERENTIAL/PLATELET  LACTIC ACID, PLASMA  CBG MONITORING, ED    EKG EKG Interpretation Date/Time:  Saturday Jan 27 2024 13:54:59 EDT Ventricular Rate:  140 PR Interval:  127 QRS Duration:  84 QT Interval:  293 QTC Calculation: 448 R Axis:   -68  Text Interpretation: Sinus tachycardia Ventricular premature complex Confirmed by Lowery Rue [phone]) on 01/27/2024 2:00:07 PM  Radiology No results found.  Procedures .Critical Care  Performed by: Tama Fails, PA-C Authorized by: Tama Fails, PA-C   Critical care provider statement:    Critical care time (minutes):  60   Critical care time was exclusive of:  Separately billable procedures and treating other patients   Critical care was necessary to treat or prevent imminent or life-threatening deterioration of the following conditions: life and limb threatening infection.   Critical care was time spent personally by me on the following activities:  Development of treatment plan with patient or surrogate, discussions with consultants, evaluation of patient's response to treatment, examination of patient, ordering and review of laboratory studies, ordering and review of radiographic studies, ordering and performing treatments and interventions, pulse oximetry, re-evaluation of patient's condition and review of old charts     Medications Ordered in ED Medications  Ampicillin -Sulbactam (UNASYN ) 3 g in sodium  chloride 0.9 % 100 mL IVPB (has no administration in time range)  lactated ringers  bolus 2,000 mL (has no administration in time range)  vancomycin  (VANCOCIN ) IVPB 1000 mg/200 mL premix (has no administration in time range)    Followed by  vancomycin  (VANCOCIN ) IVPB 1000 mg/200 mL premix (has no administration in time range)    ED Course/ Medical Decision Making/ A&P Clinical Course as of 01/27/24 2103  Sat Jan 27, 2024  1514 Case discussed with Dr. Rosemarie Conquest of podiatry regarding subcutaneous gas and concern for gas gangrene.  The patient states that around 1:00 he had a protein shake and Gatorade but vomited everything up.  He has been n.p.o. since he is here.  Plan is to take the patient to the OR directly he asked that I get a CT  of the ankle and foot stat. [AH]  1548 Case discussed with Dr. Nichole Barker - she will admit after operation. [AH]    Clinical Course User Index [AH] Tama Fails, PA-C                                 Medical Decision Making Amount and/or Complexity of Data Reviewed Labs: ordered. Radiology: ordered and independent interpretation performed.  Risk Prescription drug management. Parenteral controlled substances. Drug therapy requiring intensive monitoring for toxicity. Decision regarding hospitalization. Emergency major surgery.   This patient presents to the ED with chief complaint(s) of leg pain and swelling with pertinent past medical history of poorly menaged diabetes which further complicates the presenting complaint. The complaint involves an extensive differential diagnosis and treatment options and also carries with it a high risk of complications and morbidity.    The differential diagnosis includes gangrene, osteomyelitis, cellulitis, necrotizing fasciitis   The initial plan is to order cbc, cmp, cbg, blood cultures, lactic acid and to treat patient with Fluids, pain meds, unasyn  and vancomycin   for suspected gangrene    Additional history  obtained: Additional history obtained from family Records reviewed Care Everywhere/External Records  Reassessment and review (also see workup area): Lab Tests: I Ordered, and personally interpreted labs.  The pertinent results include:   CBC with white blood cell count 17,000.  Lactic acid 2.4 CMP shows blood glucose of 193 blood cultures pending  Imaging Studies: I ordered and independently visualized and interpreted the following imaging X-ray of the left foot  which showed subcutaneous gas and bony erosion consistent with gas gangrene and osteomyelitis The interpretation of the imaging was limited to assessing for emergent pathology, for which purpose it was ordered.  Consultations Obtained: I requested emergent consultation with the Dr. Rosemarie Conquest, and discussed  findings as well as pertinent plan - they recommend: Urgent OR transfer for amputation.  Medicines ordered and prescription drug management: I ordered the following medications         Last action: Stopped at 05/05 14:26          Total administered (from user-documented volume): 1,830.42 mg     Ampicillin -Sulbactam (UNASYN ) 3 g in sodium chloride  0.9 % 100 mL IVPB     Last action: Stopped at 05/03 16:35          Total administered: Volume not documented. Hover to view administrations.     lactated ringers  bolus 2,000 mL     Last action: Stopped at 05/03 16:35          Total administered: Volume not documented. Hover to view administrations.     piperacillin -tazobactam (ZOSYN ) IVPB 3.375 g     Last action: Stopped at 05/03 16:35          Total administered: Volume not documented. Hover to view administrations.     HYDROmorphone  (DILAUDID ) injection 0.5 mg     Last action: Given at 05/03 16:20          Total administered: 0.5 mg     fentaNYL  (SUBLIMAZE ) injection 50 mcg     Last action: Given at 05/03 15:08          Total administered: 50 mcg     ondansetron  (ZOFRAN ) injection 4 mg     Last action: Given at  05/03 15:0   for Gas Gangrene        Cardiac Monitoring: The patient was maintained on a cardiac  monitor.  I personally viewed and interpreted the cardiac monitor which showed an underlying rhythm of:  sinus tachycardia  Complexity of problems addressed: Patient's presentation is most consistent with  acute presentation with potential threat to life or bodily function During patient's assessment  Disposition: Emergent transfer to OR for amputation due to gas gangrene          Final Clinical Impression(s) / ED Diagnoses Final diagnoses:  Gangrene of toe of left foot [facility])    Rx / DC Orders ED Discharge Orders     None         Tama Fails, PA-C 02/03/24 2041    Lowery Rue, DO 02/22/24 1646

## 2024-01-27 NOTE — Anesthesia Procedure Notes (Signed)
 Procedure Name: Intubation Date/Time: 01/27/2024 6:02 PM  Performed by: Marcelle Sergeant, CRNAPre-anesthesia Checklist: Patient identified, Emergency Drugs available, Suction available and Patient being monitored Patient Re-evaluated:Patient Re-evaluated prior to induction Oxygen Delivery Method: Circle system utilized Preoxygenation: Pre-oxygenation with 100% oxygen Induction Type: IV induction Ventilation: Mask ventilation without difficulty Laryngoscope Size: Glidescope and 4 Grade View: Grade I Tube type: Oral Tube size: 7.5 mm Number of attempts: 1 Airway Equipment and Method: Stylet and Oral airway Placement Confirmation: ETT inserted through vocal cords under direct vision, positive ETCO2 and breath sounds checked- equal and bilateral Secured at: 23 cm Tube secured with: Tape Dental Injury: Teeth and Oropharynx as per pre-operative assessment

## 2024-01-27 NOTE — Progress Notes (Signed)
 Orthopedic Tech Progress Note Patient Details:  Dakota Becker November 29, 1975 [id number]  Ortho Devices Type of Ortho Device: Postop shoe/boot Ortho Device/Splint Location: Left foot Ortho Device/Splint Interventions: Application   Post Interventions Patient Tolerated: Well  Mearl Spice [name] 01/27/2024, 7:02 PM

## 2024-01-27 NOTE — H&P (Signed)
 History and Physical    Patient: Dakota Becker:[id number] DOB: 08-Jan-1976 DOA: 01/27/2024 DOS: the patient was seen and examined on 01/27/2024 PCP: Alejandro Hurt, FNP  Patient coming from: Home  Chief Complaint:  Chief Complaint  Patient presents with   Foot Pain   Wound Infection   Code Sepsis   HPI: Dakota Becker is a 48 y.o. male with medical history significant of diabetes, essential hypertension, hyperlipidemia, who developed blisters in the left foot a few days ago.  Patient has the ulcer at the left great toe since the beginning of April.  Has not been taking his diabetic medications.  Patient subsequently went to see his PCP about the infection and was placed on oral antibiotics and was being referred to podiatry.  When he saw the podiatry he was also seen to have ulcer in the right leg.  He has bilateral ingrown toenails that the podiatrist to manage.  He was told that he will follow-up with the wound management but never got contacted.  About 5 days ago he started having swelling in the left toe increased pain and started having shaking chills.  Patient again does not check his blood sugars regularly.  Started having nausea with vomiting.  His wife noted extremely foul order and asked him to come in today.  He went to the walk-in clinic and was sent to the [facility].  Upon arrival podiatry consulted.  P patient has met sepsis criteria secondary to osteomyelitis of the left great toe.  Patient was admitted directly to [facility] and was taken to the OR.  Patient had left hallux amputation and is being admitted to the medical service for management.  Review of Systems: As mentioned in the history of present illness. All other systems reviewed and are negative. Past Medical History:  Diagnosis Date   Diabetes mellitus without complication (HCC)    Hyperlipidemia    Hypertension    Past Surgical History:  Procedure Laterality Date   APPENDECTOMY     Social  History:  reports that he has never smoked. He has never used smokeless tobacco. He reports current alcohol use. He reports that he does not use drugs.  Allergies  Allergen Reactions   Milk-Related Compounds     Pass gas    Family History  Problem Relation Age of Onset   Diabetes Mother    Diabetes Father     Prior to Admission medications   Medication Sig Start Date End Date Taking? Authorizing Provider  amoxicillin  (AMOXIL ) 500 MG capsule Take 1 capsule (500 mg total) by mouth 3 (three) times daily. Patient not taking: Reported on 12/27/2023 09/21/23   Carleton Cheek, PA-C  cyclobenzaprine  (FLEXERIL ) 10 MG tablet Take 1 tablet (10 mg total) by mouth 2 (two) times daily as needed for muscle spasms. Patient not taking: Reported on 12/27/2023 01/01/17   Scarlette Currier, MD  doxycycline (ADOXA) 100 MG tablet Take 100 mg by mouth 2 (two) times daily.    [provider]  gabapentin (NEURONTIN) 300 MG capsule Take 300 mg by mouth 3 (three) times daily.    [provider]  naproxen  (NAPROSYN ) 500 MG tablet Take 1 tablet (500 mg total) by mouth every 12 (twelve) hours as needed for moderate pain (pain score 4-6) or mild pain (pain score 1-3). 09/21/23   Carleton Cheek, PA-C  pantoprazole  (PROTONIX ) 20 MG tablet Take 1 tablet (20 mg total) by mouth daily for 30 days. 12/10/18 01/09/19  Britton Cane  A, PA-C    Physical Exam: Vitals:   01/27/24 1710 01/27/24 1734 01/27/24 1841 01/27/24 1845  BP:  133/74 (!) 140/83 131/75  Pulse:  (!) 123 (!) 115 (!) 125  Resp:  18 15 (!) 25  Temp:  99.5 F (37.5 C) 99.9 F (37.7 C)   TempSrc:  Oral    SpO2:  95% 94% 96%  Weight: 106.6 kg     Height: 6' 0.99" (1.854 m)      Constitutional: Acutely ill looking, NAD, calm, comfortable Eyes: PERRL, lids and conjunctivae normal ENMT: Mucous membranes are moist. Posterior pharynx clear of any exudate or lesions.Normal dentition.  Neck: normal, supple, no masses, no thyromegaly Respiratory: clear  to auscultation bilaterally, no wheezing, no crackles. Normal respiratory effort. No accessory muscle use.  Cardiovascular: Regular rate and rhythm, no murmurs / rubs / gallops. No extremity edema. 2+ pedal pulses. No carotid bruits.  Abdomen: no tenderness, no masses palpated. No hepatosplenomegaly. Bowel sounds positive.  Musculoskeletal: Good range of motion, left foot swollen, tender, postoperatively, Skin: Red, swollen, big toe,, no rashes, lesions, ulcers. No induration Neurologic: CN 2-12 grossly intact. Sensation intact, DTR normal. Strength 5/5 in all 4.  Psychiatric: Normal judgment and insight. Alert and oriented x 3. Normal mood  Data Reviewed:  Temperature 100.8 blood pressure 140/83, pulse 150 respiratory of 25 oxygen sat 89% on room air white count 17.6 platelets 468 CO2 19 chloride 93 calcium 10.4 glucose 193 , hemoglobin A1c is 9.8, lactic acid 2.4  Assessment and Plan:  #1 sepsis due to gas gangrene of left big toe with osteomyelitis: Patient is status post partial first ray amputation of left foot with second toe amputation at MPJ level of the left foot as well as application of dissolvable antibiotic beads in the left foot.  #2 gas givosiran first ray left foot with gangrene of second toe and osteomyelitis: As above.  Wound care per podiatry now.  #3 diabetes: Non-insulin-dependent.  Noncompliant with medications.  A1c more than 9.  Initiate sliding scale insulin in the hospital.  Counseling with diabetic education prior to discharge.  #4 essential hypertension: Not on any home regimen.  Initiate treatment as necessary.  #5 hypercalcemia: Calcium is 10.4.  Most likely due to osteomyelitis with the bone involvement.  Hydrate aggressively and monitor calcium level  #6 lactic acidosis: Secondary to sepsis.  Continue to monitor.    Advance Care Planning:   Code Status: Full Code   Consults: Dr. Rosemarie Conquest, podiatry  Family Communication: No family at bedside  Severity  of Illness: The appropriate patient status for this patient is INPATIENT. Inpatient status is judged to be reasonable and necessary in order to provide the required intensity of service to ensure the patient's safety. The patient's presenting symptoms, physical exam findings, and initial radiographic and laboratory data in the context of their chronic comorbidities is felt to place them at high risk for further clinical deterioration. Furthermore, it is not anticipated that the patient will be medically stable for discharge from the hospital within 2 midnights of admission.   * I certify that at the point of admission it is my clinical judgment that the patient will require inpatient hospital care spanning beyond 2 midnights from the point of admission due to high intensity of service, high risk for further deterioration and high frequency of surveillance required.*  AuthorCarolin Chyle, MD 01/27/2024 6:57 PM  For on call review [email or web address].

## 2024-01-27 NOTE — Op Note (Signed)
 Full Operative Report  Date of Operation: 5:55 PM, 01/27/2024   Patient: Dakota Becker - 48 y.o. male  Surgeon: Evertt Hoe, DPM   Assistant: None  Diagnosis: Gas gangrene first ray left foot, gangrene of 2nd toe, osteomyelitis  Procedure:  1. Partial first ray amputation of left foot 2. 2nd toe amputation at MPJ level of left foot 3. Application dissolvable antibiotic beads left foot    Anesthesia: General  No responsible provider has been recorded for the case.  Anesthesiologist: Juventino Oppenheim, MD CRNA: Marcelle Sergeant, CRNA   Estimated Blood Loss: 20 mL  Hemostasis: 1) Anatomical dissection, mechanical compression, electrocautery 2) No tourniquet was used during the procedure  Implants: * No implants in log *  Materials: none  Injectables: 1) Pre-operatively: none 2) Post-operatively: 20 mL 0.5% marcaine plain  Specimens: Pathology: first ray for pathology, left foot. Second toe left foot  Microbiology: Deep bone culture left great toe, deep tissue culture left medial forefoot   Antibiotics: IV antibiotics given per schedule on the ED/ floor  Drains: None  Complications: Patient tolerated the procedure well without complication.   Operative findings: As below in detailed report  Indications for Procedure: Dakota Becker presents to Evertt Hoe, DPM with a chief complaint of redness swelling and drainage of left great toe and gangrene of 2nd toe, found to have gas gangrene of left foot. The patient has failed conservative treatments of various modalities. At this time the patient has elected to proceed with surgical correction. All alternatives, risks, and complications of the procedures were thoroughly explained to the patient. Patient exhibits appropriate understanding of all discussion points and informed consent was signed and obtained in the chart with no guarantees to surgical outcome given or implied.  Description of  Procedure: Patient was brought to the operating room. Patient remained on their hospital bed in the supine position. A surgical timeout was performed and all members of the operating room, the procedure, and the surgical site were identified. anesthesia occurred as per anesthesia record. Local anesthetic as previously described was then injected about the operative field in a local infiltrative block.  The operative lower extremity as noted above was then prepped and draped in the usual sterile manner. The following procedure then began.  Attention was directed to the LEFT lower extremity. A racquet type incision was made over the dorsal medial aspect of the 1st  metatarsal, including the entire digit. A full thickness incision was made down to bone using a #15 blade. The incision was continued through the soft tissue down to the shaft of the 1st  metatarsal. At this time, 5-10 cc of purulent drainage was noted near the metatarsal head. There was significant malodor consistent with enterococcal infection. A deep wound culture was taken at this time and passed off the field. Using a #15 blade, the digit was then disarticulated in its entirety at the metatarsophalangeal joint and freed of all soft tissue attachments. The specimen was passed off the field and sent for gross pathology. Next, a 15 blade was then used to free up the periosteum on the metatarsal shaft. Using a sagittal saw, the metatarsal was cut in a dorsal distal to plantar proximal orientation and beveled so that the medial cortex was shorter than the lateral. A bone culture was taken at this time and sent for micro from the great toe proximal phalanx. The bone was passed off the field and sent as a gross specimen.  All remaining  non-viable and necrotic tissues were sharply resected and removed. Extensor and flexors tendons were grasped with a hemostat and cut proximally.  There was extensive necrotic tissues about the first MPJ as well as necrotic  tissues tracking dorsally along the EHL tendon.  Attention was directed to the 2nd digit on the LEFT foot which was noted to have gangrenous changes. A full-thickness incision encompassing the entire digit was made using a #15 blade. Dissection was carried down to bone. The toe was secured with a towel clamp, further dissected in its entirety, and disarticulated at the MPJ and passed to the back table as a gross specimen. This was then labled and sent to pathology. The bone and soft tissue was noted to be necrotic especially medially. All remaining necrotic and devitalized soft tissue structures were visualized and dissected away using sharp and dull dissection. Care was taken to protect all neurovascular structures throughout the dissection. All bleeders were cauterized as necessary.  The surgical site was then flushed with 3000ml of saline with pulse lavage.  Hemostasis was achieved with electrocautery.  Next antibiotic beads dissolvable calcium sulfate beads mixed with vancomycin and tobramycin were prepared on the back table. 5 cc of small beads were prepared. These were then implanted in the surgical site.   The surgical site was then dressed with betadine soaked gauze some of which was packed dorsally, 4x4 abd pad kerlix ace. The patient tolerated both the procedure and anesthesia well with vital signs stable throughout. The patient was transferred in good condition and all vital signs stable  from the OR to recovery under the discretion of anesthesia.  Condition: Vital signs stable, neurovascular status unchanged from preoperative   Surgical plan:  Significant infection about the first ray and 2nd toe. Will require further debridement in 2-3 days, may require long term wound vac therapy due to soft tissue loss 2/2 infection. Strict non weightbearing LLE. Leave dressing C/D/I reinforce as needed. Follow cultrues/ path.   The patient will be NWB in a post op shoe to the operative limb until further  instructed. The dressing is to remain clean, dry, and intact. Will continue to follow unless noted elsewhere.   Russ Course, DPM [facility]

## 2024-01-27 NOTE — ED Notes (Signed)
 Blood cultures x2 drawn before starting atb.

## 2024-01-27 NOTE — Consult Note (Addendum)
 PODIATRY CONSULTATION  NAME Dakota Becker MRN [id number] DOB 04-24-1976 DOA 01/27/2024   Reason for consult:  Chief Complaint  Patient presents with   Foot Pain   Wound Infection   Code Sepsis    Attending/Consulting physician: ED  History of present illness: "48 y.o. male  with a past medical history of diabetes who presents the emergency department with pain in the left foot.  He has a past medical history of diabetes, he developed an ulcer after getting blisters on the plantar surface of his left great toe at the beginning of April.  The patient had not been taking his diabetes medications to the development of this abscess.  He had seen his problem primary care doctor about the infection and was placed on antibiotics prior to seeing podiatry.  Also stenosed noticed to have ulceration of his right leg.  And bilateral ingrown toenails which were managed by podiatry.  Patient reports he was supposed to follow-up with wound management but never got called.  He contacted podiatry again and has a follow-up visit with wound care scheduled for later in May.  Patient reports that this past Tuesday, 5 days ago patient began having swelling in the left toe, he began having increased pain and started having shaking chills.  He does not take his blood sugars regularly at home and says that he also had episodes of vomiting.  His wife has been helping to manage noticed that he had extremely foul odor and asked him to come in today.  Patient was seen at the [facility] walk-in clinic and sent immediately to the emergency department today.  He has been managing his wound at home by applying iodine"  Pt seen in pre op. Discussed serious nature of his infection and need for surgery. He states he had an appointment with our office in early April and was supposed to follow up with wound care center but no one ever called. Unclear why he did not follow up with our office either. Reports it was doing ok until this past  Tuesday when it started to breakdown further.     Past Medical History:  Diagnosis Date   Diabetes mellitus without complication (HCC)    Hyperlipidemia    Hypertension        Latest Ref Rng & Units 01/27/2024    2:50 PM 12/10/2018   11:44 AM 01/27/2015    4:58 PM  CBC  WBC 4.0 - 10.5 K/uL 17.6  4.6  8.5   Hemoglobin 13.0 - 17.0 g/dL 57.8  46.9  62.9   Hematocrit 39.0 - 52.0 % 42.2  48.9  48.7   Platelets 150 - 400 K/uL 468  334  373        Latest Ref Rng & Units 01/27/2024    2:50 PM 12/10/2018   11:44 AM 01/27/2015    4:58 PM  BMP  Glucose 70 - 99 mg/dL 528  413  244   BUN 6 - 20 mg/dL 12  9  7    Creatinine 0.61 - 1.24 mg/dL 0.10  2.72  5.36   Sodium 135 - 145 mmol/L 136  137  139   Potassium 3.5 - 5.1 mmol/L 3.9  3.7  3.4   Chloride 98 - 111 mmol/L 93  96  101   CO2 22 - 32 mmol/L 19  27  25    Calcium 8.9 - 10.3 mg/dL 64.4  9.6  03.4       Physical Exam: Lower Extremity  Exam Left foot Necrosis of the left hallux and 2nd toe Purulence expresed from 2nd toe Dorsal PIPJ ulcer Significant malodor and edema of the left medial forefoot Edema of left foot and ankle Crepitus of dorsal hallux.  Dp and PT pulses 2+ palpable  Right foot hyperkeratotic lesion sub right hallux without evidence of infection   ASSESSMENT/PLAN OF CARE 48 y.o. male with PMHx significant for  DM2 with gas gangrene and osteomyelitis of left hallux and gangrene of 2nd  toe with concern for underlying osteomyelitis  Tmax 100.8, Tachycardia  WBC17.6 LA: 2.4 CT L foot: Soft tissue emphysema of 1st ray, intraosseous gas in phalanges of great toe, osteomyelitis great toe  - NPO for OR today for partial first ray amputation,   2nd toe amputation, possible antibiotic beads and possible wound vac - Will need return to OR later this admission for further debridement. He is at high risk for limb loss.  - Emergent procedure considering sepsis, soft tissue emphysema - Continue IV abx broad spectrum pending  further culture data - Anticoagulation: Hold pending OR, resume 24 hrs post operatively ok - Wound care: Leave surgical dressing in place - WB status: NWB LLE - Will continue to follow   Thank you for the consult.  Please contact me directly with any questions or concerns.           Maridee Shoemaker, DPM [facility] / [facility]    [address] [place], [place] [address]                Office [phone]  Fax [phone]

## 2024-01-27 NOTE — ED Triage Notes (Signed)
 Left and right great toes has toenail removed in April by MD. Wounds have not been seen by wound care. Wounds has a foul smell.

## 2024-01-27 NOTE — Anesthesia Preprocedure Evaluation (Addendum)
 Anesthesia Evaluation  Patient identified by MRN, date of birth, ID band Patient awake    Reviewed: Allergy & Precautions, NPO status , Patient's Chart, lab work & pertinent test results  History of Anesthesia Complications Negative for: history of anesthetic complications  Airway Mallampati: I  TM Distance: >3 FB Neck ROM: Full    Dental  (+) Dental Advisory Given   Pulmonary neg pulmonary ROS   Pulmonary exam normal        Cardiovascular hypertension, Pt. on medications Normal cardiovascular exam     Neuro/Psych negative neurological ROS  negative psych ROS   GI/Hepatic negative GI ROS, Neg liver ROS,,,  Endo/Other  diabetes, Type 2, Oral Hypoglycemic Agents   Obesity   Renal/GU negative Renal ROS     Musculoskeletal negative musculoskeletal ROS (+)    Abdominal   Peds  Hematology negative hematology ROS (+)   Anesthesia Other Findings   Reproductive/Obstetrics                             Anesthesia Physical Anesthesia Plan  ASA: 2 and emergent  Anesthesia Plan: General   Post-op Pain Management: Ofirmev  IV (intra-op)*   Induction: Intravenous and Rapid sequence  PONV Risk Score and Plan: 2 and Treatment may vary due to age or medical condition, Ondansetron , Dexamethasone and Midazolam  Airway Management Planned: Oral ETT  Additional Equipment: None  Intra-op Plan:   Post-operative Plan: Extubation in OR  Informed Consent: I have reviewed the patients History and Physical, chart, labs and discussed the procedure including the risks, benefits and alternatives for the proposed anesthesia with the patient or authorized representative who has indicated his/her understanding and acceptance.     Dental advisory given  Plan Discussed with: CRNA and Anesthesiologist  Anesthesia Plan Comments:        Anesthesia Quick Evaluation

## 2024-01-27 NOTE — Progress Notes (Signed)
 ED Pharmacy Antibiotic Sign Off An antibiotic consult was received from an ED provider for zosyn per pharmacy dosing for potential nec fasc. A chart review was completed to assess appropriateness.   The following one time order(s) were placed:  Zosyn 3.375mg  x1  Linezolid 600mg  q12h per provider    Further antibiotic and/or antibiotic pharmacy consults should be ordered by the admitting provider if indicated.   Thank you for allowing pharmacy to be a part of this patient's care.   Mamie Searles, PharmD, BCCCP  Clinical Pharmacist 01/27/24 3:05 PM

## 2024-01-27 NOTE — ED Notes (Signed)
 Called Tequ[facility] at [facility] to contact OR and arrange transport

## 2024-01-27 NOTE — Anesthesia Postprocedure Evaluation (Signed)
 Anesthesia Post Note  Patient: Dakota Becker  Procedure(s) Performed: Partial first ray amp, gas gangrene (Left) AMPUTATION 2nd Toe (Left: Toe)     Patient location during evaluation: PACU Anesthesia Type: General Level of consciousness: awake and alert Pain management: pain level controlled Vital Signs Assessment: post-procedure vital signs reviewed and stable Respiratory status: spontaneous breathing, nonlabored ventilation and respiratory function stable Cardiovascular status: stable, blood pressure returned to baseline and tachycardic Anesthetic complications: no  No notable events documented.  Last Vitals:  Vitals:   01/27/24 1930 01/27/24 1953  BP: 116/78 137/86  Pulse: (!) 114 (!) 110  Resp: (!) 22 16  Temp:  37.1 C  SpO2: 93% (!) 89%    Last Pain:  Vitals:   01/27/24 1953  TempSrc: Oral  PainSc:                  Juventino Oppenheim

## 2024-01-28 ENCOUNTER — Telehealth (HOSPITAL_COMMUNITY): Payer: Self-pay

## 2024-01-28 DIAGNOSIS — M869 Osteomyelitis, unspecified: Secondary | ICD-10-CM | POA: Diagnosis not present

## 2024-01-28 LAB — COMPREHENSIVE METABOLIC PANEL WITH GFR
ALT: 23 U/L (ref 0–44)
AST: 25 U/L (ref 15–41)
Albumin: 2.2 g/dL — ABNORMAL LOW (ref 3.5–5.0)
Alkaline Phosphatase: 94 U/L (ref 38–126)
Anion gap: 18 — ABNORMAL HIGH (ref 5–15)
BUN: 9 mg/dL (ref 6–20)
CO2: 17 mmol/L — ABNORMAL LOW (ref 22–32)
Calcium: 8.9 mg/dL (ref 8.9–10.3)
Chloride: 102 mmol/L (ref 98–111)
Creatinine, Ser: 1.12 mg/dL (ref 0.61–1.24)
GFR, Estimated: >60 mL/min (ref >60)
Glucose, Bld: 137 mg/dL — ABNORMAL HIGH (ref 70–99)
Potassium: 3.9 mmol/L (ref 3.5–5.1)
Sodium: 137 mmol/L (ref 135–145)
Total Bilirubin: 2.3 mg/dL — ABNORMAL HIGH (ref 0.0–1.2)
Total Protein: 6.8 g/dL (ref 6.5–8.1)

## 2024-01-28 LAB — GLUCOSE, CAPILLARY
Glucose-Capillary: 147 mg/dL — ABNORMAL HIGH (ref 70–99)
Glucose-Capillary: 136 mg/dL — ABNORMAL HIGH (ref 70–99)
Glucose-Capillary: 188 mg/dL — ABNORMAL HIGH (ref 70–99)
Glucose-Capillary: 163 mg/dL — ABNORMAL HIGH (ref 70–99)
Glucose-Capillary: 151 mg/dL — ABNORMAL HIGH (ref 70–99)

## 2024-01-28 LAB — BLOOD CULTURE ID PANEL (REFLEXED) - BCID2

## 2024-01-28 LAB — CBC
HCT: 36.6 % — ABNORMAL LOW (ref 39.0–52.0)
Hemoglobin: 12.2 g/dL — ABNORMAL LOW (ref 13.0–17.0)
MCH: 29.5 pg (ref 26.0–34.0)
MCHC: 33.3 g/dL (ref 30.0–36.0)
MCV: 88.6 fL (ref 80.0–100.0)
Platelets: 387 10*3/uL (ref 150–400)
RBC: 4.13 MIL/uL — ABNORMAL LOW (ref 4.22–5.81)
RDW: 12.8 % (ref 11.5–15.5)
WBC: 18.4 10*3/uL — ABNORMAL HIGH (ref 4.0–10.5)
nRBC: 0 % (ref 0.0–0.2)

## 2024-01-28 LAB — PROTIME-INR
INR: 1.3 — ABNORMAL HIGH (ref 0.8–1.2)
Prothrombin Time: 16.4 s — ABNORMAL HIGH (ref 11.4–15.2)

## 2024-01-28 LAB — LACTIC ACID, PLASMA
Lactic Acid, Venous: 0.9 mmol/L (ref 0.5–1.9)
Lactic Acid, Venous: 0.9 mmol/L (ref 0.5–1.9)

## 2024-01-28 LAB — CORTISOL-AM, BLOOD: Cortisol - AM: 26.9 ug/dL — ABNORMAL HIGH (ref 6.7–22.6)

## 2024-01-28 MED ORDER — OXYCODONE-ACETAMINOPHEN 5-325 MG PO TABS
1.0000 | ORAL_TABLET | Freq: Four times a day (QID) | ORAL | Status: DC | PRN
  Administered 2024-01-28 – 2024-01-31 (×4): 1 via ORAL
  Administered 2024-02-05 – 2024-02-06 (×2): 2 via ORAL
  Administered 2024-02-07 (×2): 1 via ORAL
  Filled 2024-01-28: qty 1
  Filled 2024-01-28: qty 2
  Filled 2024-01-28: qty 1
  Filled 2024-01-28: qty 2
  Filled 2024-01-28 (×4): qty 1

## 2024-01-28 MED ORDER — PROCHLORPERAZINE EDISYLATE 10 MG/2ML IJ SOLN
5.0000 mg | Freq: Four times a day (QID) | INTRAMUSCULAR | Status: DC | PRN
  Administered 2024-01-28 – 2024-02-03 (×2): 5 mg via INTRAVENOUS
  Filled 2024-01-28 (×2): qty 2

## 2024-01-28 MED ORDER — PIPERACILLIN-TAZOBACTAM 3.375 G IVPB
3.3750 g | Freq: Three times a day (TID) | INTRAVENOUS | Status: DC
  Administered 2024-01-28 – 2024-01-29 (×3): 3.375 g via INTRAVENOUS
  Filled 2024-01-28 (×3): qty 50

## 2024-01-28 MED ORDER — METRONIDAZOLE 500 MG/100ML IV SOLN
500.0000 mg | Freq: Two times a day (BID) | INTRAVENOUS | Status: DC
  Administered 2024-01-28: 500 mg via INTRAVENOUS
  Filled 2024-01-28: qty 100

## 2024-01-28 MED ORDER — LACTATED RINGERS IV BOLUS
1000.0000 mL | Freq: Once | INTRAVENOUS | Status: AC
  Administered 2024-01-28: 1000 mL via INTRAVENOUS

## 2024-01-28 NOTE — Progress Notes (Signed)
 Pt with c/o new fever 102.2 F and chills and emesis x 2. Dr. Glynda Lash called and new order for Compazine  received and administered. Tylenol  also administered for fever per order.

## 2024-01-28 NOTE — Progress Notes (Signed)
 MEWS Progress Note  Patient Details Name: Dakota Becker MRN: [id number] DOB: [birth year] Today's Date: 01/28/2024   MEWS Flowsheet Documentation:  Assess: MEWS Score Temp: 99.8 F (37.7 C) BP: 139/73 MAP (mmHg): 91 Pulse Rate: (!) 114 ECG Heart Rate: (!) 116 Resp: 20 Level of Consciousness: Alert SpO2: 98 % O2 Device: Room Air Patient Activity (if Appropriate): In bed O2 Flow Rate (L/min): 6 L/min Assess: MEWS Score MEWS Temp: 0 MEWS Systolic: 0 MEWS Pulse: 2 MEWS RR: 0 MEWS LOC: 0 MEWS Score: 2 MEWS Score Color: Yellow Assess: SIRS CRITERIA SIRS Temperature : 0 SIRS Respirations : 0 SIRS Pulse: 1 SIRS WBC: 1 SIRS Score Sum : 2 SIRS Temperature : 0 SIRS Pulse: 1 SIRS Respirations : 0 SIRS WBC: 1 SIRS Score Sum : 2 Assess: if the MEWS score is Yellow or Red Were vital signs accurate and taken at a resting state?: Yes Does the patient meet 2 or more of the SIRS criteria?: Yes Does the patient have a confirmed or suspected source of infection?: Yes MEWS guidelines implemented : Yes, yellow Treat MEWS Interventions: Considered administering scheduled or prn medications/treatments as ordered Take Vital Signs Increase Vital Sign Frequency : Yellow: Q2hr x1, continue Q4hrs until patient remains green for 12hrs Escalate MEWS: Escalate: Yellow: Discuss with charge nurse and consider notifying provider and/or RRT Notify: Charge Nurse/RN Name of Charge Nurse/RN Notified: Sydna Evangelist, RN      Diontre Harps I Jahziel Sinn 01/28/2024, 1:51 PM

## 2024-01-28 NOTE — Progress Notes (Signed)
 Pt now red MEWS. Dr [facility] notified. New orders for fluids and labs. Pt and wife notified of new orders. Orders implemented.

## 2024-01-28 NOTE — Telephone Encounter (Signed)
 Dakota Becker

## 2024-01-28 NOTE — Progress Notes (Signed)
 PHARMACY - PHYSICIAN COMMUNICATION CRITICAL VALUE ALERT - BLOOD CULTURE IDENTIFICATION (BCID)  Dakota Becker is an 48 y.o. male who presented to [facility] on 01/27/2024 with a chief complaint of sepsis due to gas gangrene of left big toe with osteomyelitis   Assessment:  Streptococcus species  Name of physician (or Provider) Contacted: Glynda Lash  Current antibiotics: Linezolid and Zosyn - given polymicrobial foot infection with uncontrolled source, will defer de-escalation while awaiting OR debridement tomorrow and ID consult  Changes to prescribed antibiotics recommended:  Patient is on recommended antibiotics - No changes needed  Results for orders placed or performed during the hospital encounter of 01/27/24  Blood Culture ID Panel (Reflexed) (Collected: 01/27/2024  2:56 PM)  Result Value Ref Range   Enterococcus faecalis NOT DETECTED NOT DETECTED   Enterococcus Faecium NOT DETECTED NOT DETECTED   Listeria monocytogenes NOT DETECTED NOT DETECTED   Staphylococcus species NOT DETECTED NOT DETECTED   Staphylococcus aureus (BCID) NOT DETECTED NOT DETECTED   Staphylococcus epidermidis NOT DETECTED NOT DETECTED   Staphylococcus lugdunensis NOT DETECTED NOT DETECTED   Streptococcus species DETECTED (A) NOT DETECTED   Streptococcus agalactiae NOT DETECTED NOT DETECTED   Streptococcus pneumoniae NOT DETECTED NOT DETECTED   Streptococcus pyogenes NOT DETECTED NOT DETECTED   A.calcoaceticus-baumannii NOT DETECTED NOT DETECTED   Bacteroides fragilis NOT DETECTED NOT DETECTED   Enterobacterales NOT DETECTED NOT DETECTED   Enterobacter cloacae complex NOT DETECTED NOT DETECTED   Escherichia coli NOT DETECTED NOT DETECTED   Klebsiella aerogenes NOT DETECTED NOT DETECTED   Klebsiella oxytoca NOT DETECTED NOT DETECTED   Klebsiella pneumoniae NOT DETECTED NOT DETECTED   Proteus species NOT DETECTED NOT DETECTED   Salmonella species NOT DETECTED NOT DETECTED   Serratia marcescens NOT DETECTED NOT  DETECTED   Haemophilus influenzae NOT DETECTED NOT DETECTED   Neisseria meningitidis NOT DETECTED NOT DETECTED   Pseudomonas aeruginosa NOT DETECTED NOT DETECTED   Stenotrophomonas maltophilia NOT DETECTED NOT DETECTED   Candida albicans NOT DETECTED NOT DETECTED   Candida auris NOT DETECTED NOT DETECTED   Candida glabrata NOT DETECTED NOT DETECTED   Candida krusei NOT DETECTED NOT DETECTED   Candida parapsilosis NOT DETECTED NOT DETECTED   Candida tropicalis NOT DETECTED NOT DETECTED   Cryptococcus neoformans/gattii NOT DETECTED NOT DETECTED    Dakota Becker 01/28/2024  8:24 PM

## 2024-01-28 NOTE — Progress Notes (Signed)
 Pharmacy Antibiotic Note  Dakota Becker is a 48 y.o. male admitted on 01/27/2024 with  osteomyelitis of the L great and second toes .  Pharmacy has been consulted for Zosyn dosing.  Patient underwent L partial first ray and second toe amputation on 5/3. Per podiatry note, the infection was extensive and will likely require further debridement. WBC elevated to 18.4, LA 1.5 > 0.9, Tmax 102.93F.  Previous wound cx in 12/2023 grew MSSA.  Patient currently on linezolid and flagyl, wound cultures growing GNR, GPC, GPR, will transition to linezolid and Zosyn to simplify regimen.  Plan: Continue linezolid 600mg  IV q12h per MD Stop metronidazole Start Zosyn 3.375g IV q8h Monitor clinical status, renal function, and culture data  Height: 6\' 1"  (185.4 cm) Weight: 110 kg (242 lb 8.1 oz) IBW/kg (Calculated) : 79.9  Temp (24hrs), Avg:99.9 F (37.7 C), Min:97.9 F (36.6 C), Max:102.2 F (39 C)  Recent Labs  Lab 01/27/24 1450 01/27/24 2019 01/28/24 0351 01/28/24 0355 01/28/24 0753  WBC 17.6*  --  18.4*  --   --   CREATININE 0.98  --  1.12  --   --   LATICACIDVEN 2.4* 1.5  --  0.9 0.9    Estimated Creatinine Clearance: 103.7 mL/min (by C-G formula based on SCr of 1.12 mg/dL).    Allergies  Allergen Reactions   Milk-Related Compounds     Pass gas    Antimicrobials this admission: linezolid 5/3 >>  Zosyn 5/4 >>  Flagyl, Unasyn x1  Microbiology results: 5/3 BCx: ngtd < 12h 5/3 bone cx: mod GPC, mod GNR, few GPR 5/3 tissue cx: abundant GPC, mod GNR, rare GPR   Thank you for allowing pharmacy to be a part of this patient's care.  Valarie Garner, PharmD PGY1 Pharmacy Resident  Please check AMION for all [facility] Pharmacy phone numbers After 10:00 PM, call Main Pharmacy [phone] 01/28/2024 10:27 AM

## 2024-01-28 NOTE — Progress Notes (Addendum)
 PROGRESS NOTE  Dakota Becker JWJ:[id number] DOB: [birth year] DOA: 01/27/2024 PCP: Dakota Hurt, FNP   LOS: 1 day   Brief narrative:   Dakota Becker is a 48 y.o. male with medical history significant of diabetes, essential hypertension, hyperlipidemia, with great toe ulcer developed some blisters.  Not taking his diabetic medication.  He went to PCP and was put on oral antibiotics and referred to podiatry.  Recommended wound management but he never got contacted so started having increasing swelling in the left toe with pain for 5 days or so with some chills and nausea vomiting and foul odor.  Patient then presented to [facility].  Upon arrival podiatry consulted.  Patient has met sepsis criteria secondary to osteomyelitis of the left great toe.  Patient was then taken to the OR and underwent left hallux amputation.     Assessment/Plan: Principal Problem:   Toe osteomyelitis, left (HCC) Active Problems:   Gas gangrene of foot (HCC)   Gangrene of toe of left foot (HCC)   Controlled type 2 diabetes mellitus without complication, without long-term current use of insulin (HCC)   Hypercalcemia   Sepsis due to gas gangrene of left big toe with osteomyelitis:  Patient is status post partial first ray amputation of left foot with second toe amputation at MPJ level of the left foot as well as application of dissolvable antibiotic beads in the left foot.  Podiatry to follow.  Currently on linezolid.  Might need repeat debridement.  Nonweightbearing.  Will put on the antibiotic spectrum to Zosyn to cover for anaerobic organisms.  Communicated with podiatry  Nausea decreased appetite.  Improved.  Was able to tolerate oral diet this morning.  Complains of constipation.  Will add stool softeners.   Diabetes mettlitus type II. Hemoglobin A1c more than 9.  Continue sliding scale insulin.  Diabetic education.  Emphasized compliance on medications.    Essential hypertension: Blood  pressure seems to be stable.  Not on medications at home.   Hypercalcemia: Latest calcium of 8.9.  Improved.   Lactic acidosis: Secondary to sepsis.  Improved.   Class I obesity. Body mass index is 31.99 kg/m.  Would benefit from lifestyle modification and weight loss as outpatient.  DVT prophylaxis: SCDs Start: 01/27/24 1954, hold Lovenox for next few days for possible need for repeat surgery.   Disposition: Home with home health likely in few days  Status is: Inpatient  Remains inpatient appropriate because: Amputation and debridement, possible need for repeat debridement, IV antibiotic, pending clinical improvement  Code Status:     Code Status: Full Code  Family Communication: Spoke with the patient's wife at bedside 01/28/2024   Consultants: Podiatry  Procedures: First ray amputation of the left foot with second toe amputation at MPJ level on 01/27/2024  Anti-infectives:  Linezolid  Anti-infectives (From admission, onward)    Start     Dose/Rate Route Frequency Ordered Stop   01/28/24 1130  piperacillin-tazobactam (ZOSYN) IVPB 3.375 g        3.375 g 12.5 mL/hr over 240 Minutes Intravenous Every 8 hours 01/28/24 1035     01/28/24 1000  metroNIDAZOLE (FLAGYL) IVPB 500 mg  Status:  Discontinued        500 mg 100 mL/hr over 60 Minutes Intravenous Every 12 hours 01/28/24 0925 01/28/24 1035   01/27/24 2200  linezolid (ZYVOX) IVPB 600 mg  Status:  Discontinued        600 mg 300 mL/hr over 60 Minutes Intravenous Every 12  hours 01/27/24 1953 01/27/24 2007   01/27/24 1835  vancomycin (VANCOCIN) powder  Status:  Discontinued          As needed 01/27/24 1835 01/27/24 1843   01/27/24 1834  tobramycin (NEBCIN) injection  Status:  Discontinued          As needed 01/27/24 1835 01/27/24 1843   01/27/24 1515  linezolid (ZYVOX) IVPB 600 mg        600 mg 300 mL/hr over 60 Minutes Intravenous Every 12 hours 01/27/24 1502     01/27/24 1515  piperacillin-tazobactam (ZOSYN) IVPB 3.375 g         3.375 g 100 mL/hr over 30 Minutes Intravenous  Once 01/27/24 1505 01/27/24 1635   01/27/24 1430  vancomycin (VANCOCIN) IVPB 1000 mg/200 mL premix  Status:  Discontinued       Placed in "Followed by" Linked Group   1,000 mg 200 mL/hr over 60 Minutes Intravenous  Once 01/27/24 1419 01/27/24 1502   01/27/24 1430  vancomycin (VANCOCIN) IVPB 1000 mg/200 mL premix  Status:  Discontinued       Placed in "Followed by" Linked Group   1,000 mg 200 mL/hr over 60 Minutes Intravenous  Once 01/27/24 1419 01/27/24 1502   01/27/24 1415  Ampicillin-Sulbactam (UNASYN) 3 g in sodium chloride  0.9 % 100 mL IVPB  Status:  Discontinued       Placed in "And" Linked Group   3 g 200 mL/hr over 30 Minutes Intravenous  Once 01/27/24 1412 01/27/24 1635   01/27/24 1415  vancomycin (VANCOCIN) IVPB 1000 mg/200 mL premix  Status:  Discontinued       Placed in "And" Linked Group   1,000 mg 200 mL/hr over 60 Minutes Intravenous  Once 01/27/24 1412 01/27/24 1419        Subjective: Today, patient was seen and examined at bedside.  States that his pain is better in his foot and was able to tolerate oral diet.  Has not had a bowel movement.  Denies any shortness of breath, nausea or vomiting.  Objective: Vitals:   01/28/24 0800 01/28/24 0900  BP: 129/70 139/73  Pulse: (!) 108 (!) 105  Resp: 13 14  Temp: 98.5 F (36.9 C)   SpO2: 96% 98%    Intake/Output Summary (Last 24 hours) at 01/28/2024 1125 Last data filed at 01/28/2024 1031 Gross per 24 hour  Intake 2513.03 ml  Output 3745 ml  Net -1231.97 ml   Filed Weights   01/27/24 1710 01/27/24 1953 01/28/24 0400  Weight: 106.6 kg 109.2 kg 110 kg   Body mass index is 31.99 kg/m.   Physical Exam: GENERAL: Patient is alert awake and oriented. Not in obvious distress.  Obese built HENT: No scleral pallor or icterus. Pupils equally reactive to light. Oral mucosa is moist NECK: is supple, no gross swelling noted. CHEST: Clear to auscultation. No crackles or  wheezes.  Diminished breath sounds bilaterally. CVS: S1 and S2 heard, no murmur. Regular rate and rhythm.  ABDOMEN: Soft, non-tender, bowel sounds are present. EXTREMITIES: Left foot with dressing and splint. CNS: Cranial nerves are intact. No focal motor deficits. SKIN: warm and dry without rashes.  Data Review: I have personally reviewed the following laboratory data and studies,  CBC: Recent Labs  Lab 01/27/24 1450 01/28/24 0351  WBC 17.6* 18.4*  NEUTROABS 16.4*  --   HGB 14.3 12.2*  HCT 42.2 36.6*  MCV 87.9 88.6  PLT 468* 387   Basic Metabolic Panel: Recent Labs  Lab 01/27/24  1450 01/28/24 0351  NA 136 137  K 3.9 3.9  CL 93* 102  CO2 19* 17*  GLUCOSE 193* 137*  BUN 12 9  CREATININE 0.98 1.12  CALCIUM 10.4* 8.9   Liver Function Tests: Recent Labs  Lab 01/27/24 1450 01/28/24 0351  AST 34 25  ALT 18 23  ALKPHOS 191* 94  BILITOT 1.0 2.3*  PROT 8.0 6.8  ALBUMIN 3.8 2.2*   No results for input(s): "LIPASE", "AMYLASE" in the last 168 hours. No results for input(s): "AMMONIA" in the last 168 hours. Cardiac Enzymes: No results for input(s): "CKTOTAL", "CKMB", "CKMBINDEX", "TROPONINI" in the last 168 hours. BNP (last 3 results) No results for input(s): "BNP" in the last 8760 hours.  ProBNP (last 3 results) No results for input(s): "PROBNP" in the last 8760 hours.  CBG: Recent Labs  Lab 01/27/24 1737 01/27/24 1840 01/27/24 2059 01/28/24 0605 01/28/24 0756  GLUCAP 205* 186* 161* 147* 136*   Recent Results (from the past 240 hours)  Blood Cultures x 2 sites     Status: None (Preliminary result)   Collection Time: 01/27/24  2:55 PM   Specimen: BLOOD LEFT FOREARM  Result Value Ref Range Status   Specimen Description BLOOD LEFT FOREARM  Final   Special Requests   Final    Blood Culture adequate volume BOTTLES DRAWN AEROBIC AND ANAEROBIC   Culture   Final    NO GROWTH < 12 HOURS Performed at [facility], [address], [place], [place]  [address]    Report Status PENDING  Incomplete  Blood Cultures x 2 sites     Status: None (Preliminary result)   Collection Time: 01/27/24  2:56 PM   Specimen: BLOOD  Result Value Ref Range Status   Specimen Description   Final    BLOOD RIGHT ANTECUBITAL Performed at [facility], [address] [place], [place] [address]    Special Requests   Final    Blood Culture adequate volume BOTTLES DRAWN AEROBIC AND ANAEROBIC   Culture   Final    NO GROWTH < 12 HOURS Performed at [facility], [address] [place], [place] [address]    Report Status PENDING  Incomplete  Aerobic/Anaerobic Culture w Gram Stain (surgical/deep wound)     Status: None (Preliminary result)   Collection Time: 01/27/24  6:14 PM   Specimen: PATH Soft tissue resection  Result Value Ref Range Status   Specimen Description TISSUE  Final   Special Requests LEFT, 1ST RAY AMP  Final   Gram Stain   Final    NO WBC SEEN ABUNDANT GRAM POSITIVE COCCI MODERATE GRAM NEGATIVE RODS RARE GRAM POSITIVE RODS    Culture   Final    TOO YOUNG TO READ Performed at [facility], [address], [place], [place] [address]    Report Status PENDING  Incomplete  Aerobic/Anaerobic Culture w Gram Stain (surgical/deep wound)     Status: None (Preliminary result)   Collection Time: 01/27/24  6:15 PM   Specimen: PATH Bone resection; Tissue  Result Value Ref Range Status   Specimen Description BONE  Final   Special Requests LEFT, 1ST RAY BONE  Final   Gram Stain   Final    NO WBC SEEN MODERATE GRAM POSITIVE COCCI MODERATE GRAM NEGATIVE RODS FEW GRAM POSITIVE RODS    Culture   Final    TOO YOUNG TO READ Performed at [facility], [address], [place], [place]  [address]    Report Status PENDING  Incomplete     Studies: DG Foot 2 Views Left Result Date: 01/27/2024 CLINICAL DATA:  Postoperative. EXAM: LEFT FOOT - 2 VIEW COMPARISON:  Left foot x-ray 01/27/2024 FINDINGS: There is been interval  amputation of the distal first metatarsal and first phalanx as well as the second phalanx. There is no acute fracture or dislocation. There are antibiotic seeds and soft tissue swelling over the surgical site. IMPRESSION: Interval amputation of the distal first metatarsal and first phalanx as well as the second phalanx. Electronically Signed   By: Tyron Gallon M.D.   On: 01/27/2024 19:08   CT Foot Left Wo Contrast Result Date: 01/27/2024 CLINICAL DATA:  Osteonecrosis suspected. Gangrene. Necrotizing fasciitis. EXAM: CT OF THE LEFT FOOT AND ANKLE WITHOUT CONTRAST TECHNIQUE: Multidetector CT imaging of the left foot was performed according to the standard protocol. Multiplanar CT image reconstructions were also generated. RADIATION DOSE REDUCTION: This exam was performed according to the departmental dose-optimization program which includes automated exposure control, adjustment of the mA and/or kV according to patient size and/or use of iterative reconstruction technique. COMPARISON:  None Available. FINDINGS: Bones/Joint/Cartilage There is intraosseous gas in the phalanges of the great toe consistent with emphysematous osteomyelitis or osteonecrosis. There is slight fragmentation of the tuft of the distal phalanx of the great toe. No other acute fracture. There is no dislocation. The bones are well mineralized. No arthritic changes. Ligaments Suboptimally assessed by CT. Muscles and Tendons No intramuscular fluid collection. Soft tissues There is diffuse subcutaneous edema. Extensive soft tissue gas in the great toe and along the dorsal of the first metatarsal consistent with necrotizing fasciitis. There is irregularity of the skin of the distal great toe likely representing ulceration. IMPRESSION: Necrotizing fasciitis of the great toe with findings of emphysematous osteomyelitis or osteonecrosis. MRI may provide better evaluation if clinically indicated. Electronically Signed   By: Angus Bark M.D.   On:  01/27/2024 16:24   CT Ankle Left Wo Contrast Result Date: 01/27/2024 CLINICAL DATA:  Osteonecrosis suspected. Gangrene. Necrotizing fasciitis. EXAM: CT OF THE LEFT FOOT AND ANKLE WITHOUT CONTRAST TECHNIQUE: Multidetector CT imaging of the left foot was performed according to the standard protocol. Multiplanar CT image reconstructions were also generated. RADIATION DOSE REDUCTION: This exam was performed according to the departmental dose-optimization program which includes automated exposure control, adjustment of the mA and/or kV according to patient size and/or use of iterative reconstruction technique. COMPARISON:  None Available. FINDINGS: Bones/Joint/Cartilage There is intraosseous gas in the phalanges of the great toe consistent with emphysematous osteomyelitis or osteonecrosis. There is slight fragmentation of the tuft of the distal phalanx of the great toe. No other acute fracture. There is no dislocation. The bones are well mineralized. No arthritic changes. Ligaments Suboptimally assessed by CT. Muscles and Tendons No intramuscular fluid collection. Soft tissues There is diffuse subcutaneous edema. Extensive soft tissue gas in the great toe and along the dorsal of the first metatarsal consistent with necrotizing fasciitis. There is irregularity of the skin of the distal great toe likely representing ulceration. IMPRESSION: Necrotizing fasciitis of the great toe with findings of emphysematous osteomyelitis or osteonecrosis. MRI may provide better evaluation if clinically indicated. Electronically Signed   By: Angus Bark M.D.   On: 01/27/2024 16:24   DG Foot Complete Left Result Date: 01/27/2024 CLINICAL DATA:  Gangrene of left and right great toes with toenail removal in April. EXAM: LEFT FOOT - COMPLETE 3+ VIEW COMPARISON:  04/10/2012. FINDINGS: There  are destructive bony changes at the tuft and base of the distal phalanx of the first digit with fragmentation at the tuft. No dislocation is seen.  There soft tissue swelling at the first digit with multiple skin defects and subcutaneous air at the first digit and extending over the dorsum of the first and second metatarsal. IMPRESSION: 1. Destructive changes involving the tuft and base of the distal phalanx of the first digit, suspicious for osteomyelitis. 2. Extensive subcutaneous emphysema involving the first digit extending over the dorsum of the first and second metatarsals. Electronically Signed   By: Wyvonnia Heimlich M.D.   On: 01/27/2024 15:01      Rosena Conradi, MD  [facility] 01/28/2024  If 7PM-7AM, please contact night-coverage

## 2024-01-28 NOTE — Progress Notes (Signed)
   PODIATRY PROGRESS NOTE Patient Name: Dakota Becker  DOB [birth year] DOA 01/27/2024  Hospital Day: 2  Assessment:  48 y.o. 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 Partial first ray amputation of left foot 2. 2nd toe amputation at MPJ level of left foot 3. Application dissolvable antibiotic beads left foot      AF, mild tachycardia WBC 18.4  Path: p  Cultures: Pending, GPC, GNR, GPR  CT L foot: Soft tissue emphysema of 1st ray, intraosseous gas in phalanges of great toe, osteomyelitis great toe  Plan:  - NPO after MN for return to OR tomorrow AM for repeat debridement graft and vac application - will require long term vac therapy as not able to close primarily.  - Ordered Venous US  LLE to r/o DVT given left calf pain - Continue IV abx broad spectrum pending further culture data - Recommend ID consultation given severity of infection - Anticoagulation: Hold pending OR tmrw am, SCDs recommended - Wound care: Leave surgical dressing in place - WB status: NWB LLE, does not need to wear Post op shoe while in bed - Will continue to follow        Maridee Shoemaker, DPM [facility]    Subjective:  Discussed findings from OR yesterday as well as plans going forward for back to OR tmrw. He is in agreement to proceed. We also discussed he is having left calf and ankle pain - thinks ankle pain due to post op shoe which was causing some irritation while in bed  Objective:   Vitals:   01/28/24 0800 01/28/24 0900  BP: 129/70 139/73  Pulse: (!) 108 (!) 105  Resp: 13 14  Temp: 98.5 F (36.9 C)   SpO2: 96% 98%       Latest Ref Rng & Units 01/28/2024    3:51 AM 01/27/2024    2:50 PM 12/10/2018   11:44 AM  CBC  WBC 4.0 - 10.5 K/uL 18.4  17.6  4.6   Hemoglobin 13.0 - 17.0 g/dL 16.1  09.6  04.5   Hematocrit 39.0 - 52.0 % 36.6  42.2   48.9   Platelets 150 - 400 K/uL 387  468  334        Latest Ref Rng & Units 01/28/2024    3:51 AM 01/27/2024    2:50 PM 12/10/2018   11:44 AM  BMP  Glucose 70 - 99 mg/dL 409  811  914   BUN 6 - 20 mg/dL 9  12  9    Creatinine 0.61 - 1.24 mg/dL 7.82  9.56  2.13   Sodium 135 - 145 mmol/L 137  136  137   Potassium 3.5 - 5.1 mmol/L 3.9  3.9  3.7   Chloride 98 - 111 mmol/L 102  93  96   CO2 22 - 32 mmol/L 17  19  27    Calcium 8.9 - 10.3 mg/dL 8.9  08.6  9.6     General: AAOx3, NAD  Lower Extremity Exam Dressing C/D/I to left foot Pain with compression of left calf Able to DF/PF left ankle without severe pain or limitation just says calf feels tight   Radiology:  Results reviewed. See assessment for pertinent imaging results

## 2024-01-28 NOTE — Plan of Care (Signed)
  Problem: Education: Goal: Knowledge of General Education information will improve Description: Including pain rating scale, medication(s)/side effects and non-pharmacologic comfort measures Outcome: Progressing   Problem: Clinical Measurements: Goal: Ability to maintain clinical measurements within normal limits will improve Outcome: Progressing   Problem: Nutrition: Goal: Adequate nutrition will be maintained Outcome: Progressing   Problem: Pain Managment: Goal: General experience of comfort will improve and/or be controlled Outcome: Progressing   Problem: Skin Integrity: Goal: Risk for impaired skin integrity will decrease Outcome: Progressing

## 2024-01-29 ENCOUNTER — Encounter (HOSPITAL_COMMUNITY)

## 2024-01-29 ENCOUNTER — Inpatient Hospital Stay (HOSPITAL_COMMUNITY)

## 2024-01-29 ENCOUNTER — Inpatient Hospital Stay (HOSPITAL_COMMUNITY): Admitting: Anesthesiology

## 2024-01-29 ENCOUNTER — Ambulatory Visit: Admitting: Podiatry

## 2024-01-29 ENCOUNTER — Encounter (HOSPITAL_COMMUNITY): Payer: Self-pay | Admitting: Internal Medicine

## 2024-01-29 ENCOUNTER — Other Ambulatory Visit: Payer: Self-pay

## 2024-01-29 ENCOUNTER — Encounter (HOSPITAL_COMMUNITY)
Admission: EM | Disposition: A | Discharge status: Home / Self Care | Payer: Self-pay | Source: Ambulatory Visit | Attending: Internal Medicine

## 2024-01-29 DIAGNOSIS — E1152 Type 2 diabetes mellitus with diabetic peripheral angiopathy with gangrene: Secondary | ICD-10-CM | POA: Diagnosis not present

## 2024-01-29 DIAGNOSIS — B9689 Other specified bacterial agents as the cause of diseases classified elsewhere: Secondary | ICD-10-CM

## 2024-01-29 DIAGNOSIS — Z794 Long term (current) use of insulin: Secondary | ICD-10-CM | POA: Diagnosis not present

## 2024-01-29 DIAGNOSIS — I96 Gangrene, not elsewhere classified: Secondary | ICD-10-CM | POA: Diagnosis not present

## 2024-01-29 DIAGNOSIS — Z9889 Other specified postprocedural states: Secondary | ICD-10-CM | POA: Diagnosis not present

## 2024-01-29 DIAGNOSIS — M86172 Other acute osteomyelitis, left ankle and foot: Secondary | ICD-10-CM | POA: Diagnosis not present

## 2024-01-29 DIAGNOSIS — A480 Gas gangrene: Secondary | ICD-10-CM | POA: Diagnosis not present

## 2024-01-29 DIAGNOSIS — M869 Osteomyelitis, unspecified: Secondary | ICD-10-CM | POA: Diagnosis not present

## 2024-01-29 DIAGNOSIS — E1169 Type 2 diabetes mellitus with other specified complication: Secondary | ICD-10-CM | POA: Diagnosis not present

## 2024-01-29 DIAGNOSIS — Z89422 Acquired absence of other left toe(s): Secondary | ICD-10-CM | POA: Diagnosis not present

## 2024-01-29 HISTORY — PX: INCISION AND DRAINAGE OF WOUND: SHX1803

## 2024-01-29 LAB — GLUCOSE, CAPILLARY
Glucose-Capillary: 258 mg/dL — ABNORMAL HIGH (ref 70–99)
Glucose-Capillary: 147 mg/dL — ABNORMAL HIGH (ref 70–99)
Glucose-Capillary: 128 mg/dL — ABNORMAL HIGH (ref 70–99)
Glucose-Capillary: 115 mg/dL — ABNORMAL HIGH (ref 70–99)
Glucose-Capillary: 121 mg/dL — ABNORMAL HIGH (ref 70–99)
Glucose-Capillary: 189 mg/dL — ABNORMAL HIGH (ref 70–99)

## 2024-01-29 LAB — CBC
HCT: 37.6 % — ABNORMAL LOW (ref 39.0–52.0)
Hemoglobin: 12.3 g/dL — ABNORMAL LOW (ref 13.0–17.0)
MCH: 29 pg (ref 26.0–34.0)
MCHC: 32.7 g/dL (ref 30.0–36.0)
MCV: 88.7 fL (ref 80.0–100.0)
Platelets: 399 10*3/uL (ref 150–400)
RBC: 4.24 MIL/uL (ref 4.22–5.81)
RDW: 13 % (ref 11.5–15.5)
WBC: 18.5 10*3/uL — ABNORMAL HIGH (ref 4.0–10.5)
nRBC: 0 % (ref 0.0–0.2)

## 2024-01-29 LAB — BASIC METABOLIC PANEL WITH GFR
Anion gap: 13 (ref 5–15)
BUN: 9 mg/dL (ref 6–20)
CO2: 21 mmol/L — ABNORMAL LOW (ref 22–32)
Calcium: 9.1 mg/dL (ref 8.9–10.3)
Chloride: 99 mmol/L (ref 98–111)
Creatinine, Ser: 0.91 mg/dL (ref 0.61–1.24)
GFR, Estimated: >60 mL/min (ref >60)
Glucose, Bld: 174 mg/dL — ABNORMAL HIGH (ref 70–99)
Potassium: 4 mmol/L (ref 3.5–5.1)
Sodium: 133 mmol/L — ABNORMAL LOW (ref 135–145)

## 2024-01-29 LAB — MAGNESIUM: Magnesium: 1.8 mg/dL (ref 1.7–2.4)

## 2024-01-29 LAB — CBG MONITORING, ED: Glucose-Capillary: 194 mg/dL — ABNORMAL HIGH (ref 70–99)

## 2024-01-29 SURGERY — IRRIGATION AND DEBRIDEMENT WOUND
Anesthesia: Monitor Anesthesia Care | Laterality: Left

## 2024-01-29 MED ORDER — PROPOFOL 10 MG/ML IV BOLUS
INTRAVENOUS | Status: AC
Start: 2024-01-29 — End: ?
  Filled 2024-01-29: qty 20

## 2024-01-29 MED ORDER — SODIUM CHLORIDE 0.9 % IV SOLN
2.0000 g | INTRAVENOUS | Status: DC
  Administered 2024-01-29 – 2024-01-30 (×3): 2 g via INTRAVENOUS
  Filled 2024-01-29 (×3): qty 20

## 2024-01-29 MED ORDER — PROPOFOL 10 MG/ML IV BOLUS
INTRAVENOUS | Status: AC
  Filled 2024-01-29: qty 20

## 2024-01-29 MED ORDER — SODIUM CHLORIDE 0.9 % IR SOLN
Status: DC | PRN
  Administered 2024-01-29: 3000 mL

## 2024-01-29 MED ORDER — ACETAMINOPHEN 500 MG PO TABS
1000.0000 mg | ORAL_TABLET | Freq: Once | ORAL | Status: AC
  Administered 2024-01-29: 1000 mg via ORAL
  Filled 2024-01-29: qty 2

## 2024-01-29 MED ORDER — MIDAZOLAM HCL 2 MG/2ML IJ SOLN
INTRAMUSCULAR | Status: DC | PRN
  Administered 2024-01-29: 2 mg via INTRAVENOUS

## 2024-01-29 MED ORDER — ORAL CARE MOUTH RINSE: 15.0000 mL | Freq: Once | OROMUCOSAL | Status: AC

## 2024-01-29 MED ORDER — AMISULPRIDE (ANTIEMETIC) 5 MG/2ML IV SOLN: 10.0000 mg | Freq: Once | INTRAVENOUS | Status: DC | PRN

## 2024-01-29 MED ORDER — CHLORHEXIDINE GLUCONATE 0.12 % MT SOLN: 15.0000 mL | Freq: Once | OROMUCOSAL | Status: AC

## 2024-01-29 MED ORDER — OXYCODONE HCL 5 MG/5ML PO SOLN: 5.0000 mg | Freq: Once | ORAL | Status: DC | PRN

## 2024-01-29 MED ORDER — CHLORHEXIDINE GLUCONATE 0.12 % MT SOLN
OROMUCOSAL | Status: AC
  Administered 2024-01-29: 15 mL via OROMUCOSAL
  Filled 2024-01-29: qty 15

## 2024-01-29 MED ORDER — BUPIVACAINE HCL (PF) 0.5 % IJ SOLN
INTRAMUSCULAR | Status: DC | PRN
  Administered 2024-01-29: 5 mL

## 2024-01-29 MED ORDER — ONDANSETRON HCL 4 MG/2ML IJ SOLN
INTRAMUSCULAR | Status: AC
  Filled 2024-01-29: qty 2

## 2024-01-29 MED ORDER — OXYCODONE HCL 5 MG PO TABS: 5.0000 mg | ORAL_TABLET | Freq: Once | ORAL | Status: DC | PRN

## 2024-01-29 MED ORDER — ONDANSETRON HCL 4 MG/2ML IJ SOLN
INTRAMUSCULAR | Status: DC | PRN
  Administered 2024-01-29: 4 mg via INTRAVENOUS

## 2024-01-29 MED ORDER — 0.9 % SODIUM CHLORIDE (POUR BTL) OPTIME
TOPICAL | Status: DC | PRN
  Administered 2024-01-29: 1000 mL

## 2024-01-29 MED ORDER — FENTANYL CITRATE (PF) 250 MCG/5ML IJ SOLN
INTRAMUSCULAR | Status: DC | PRN
  Administered 2024-01-29: 50 ug via INTRAVENOUS

## 2024-01-29 MED ORDER — FENTANYL CITRATE (PF) 100 MCG/2ML IJ SOLN: 25.0000 ug | INTRAMUSCULAR | Status: DC | PRN

## 2024-01-29 MED ORDER — LIDOCAINE HCL (PF) 1 % IJ SOLN
INTRAMUSCULAR | Status: AC
  Filled 2024-01-29: qty 30

## 2024-01-29 MED ORDER — LACTATED RINGERS IV SOLN: INTRAVENOUS | Status: DC

## 2024-01-29 MED ORDER — PROPOFOL 10 MG/ML IV BOLUS
INTRAVENOUS | Status: DC | PRN
  Administered 2024-01-29: 100 ug/kg/min via INTRAVENOUS

## 2024-01-29 MED ORDER — FENTANYL CITRATE (PF) 250 MCG/5ML IJ SOLN
INTRAMUSCULAR | Status: AC
  Filled 2024-01-29: qty 5

## 2024-01-29 MED ORDER — LINEZOLID 600 MG PO TABS
600.0000 mg | ORAL_TABLET | Freq: Two times a day (BID) | ORAL | Status: DC
  Administered 2024-01-29 – 2024-02-02 (×8): 600 mg via ORAL
  Filled 2024-01-29 (×10): qty 1

## 2024-01-29 MED ORDER — BUPIVACAINE HCL (PF) 0.5 % IJ SOLN
INTRAMUSCULAR | Status: AC
  Filled 2024-01-29: qty 30

## 2024-01-29 MED ORDER — LIDOCAINE HCL (PF) 1 % IJ SOLN
INTRAMUSCULAR | Status: DC | PRN
  Administered 2024-01-29: 5 mL

## 2024-01-29 MED ORDER — MIDAZOLAM HCL 2 MG/2ML IJ SOLN
INTRAMUSCULAR | Status: AC
  Filled 2024-01-29: qty 2

## 2024-01-29 SURGICAL SUPPLY — 48 items
BLADE SURG 15 STRL LF DISP TIS (BLADE) ×1 IMPLANT
BNDG ELASTIC 3INX 5YD STR LF (GAUZE/BANDAGES/DRESSINGS) ×1 IMPLANT
BNDG ELASTIC 4INX 5YD STR LF (GAUZE/BANDAGES/DRESSINGS) ×1 IMPLANT
BNDG ELASTIC 6X10 VLCR STRL LF (GAUZE/BANDAGES/DRESSINGS) IMPLANT
BNDG ESMARK 4X9 LF (GAUZE/BANDAGES/DRESSINGS) ×1 IMPLANT
BNDG GAUZE DERMACEA FLUFF 4 (GAUZE/BANDAGES/DRESSINGS) ×1 IMPLANT
BNDG STRETCH GAUZE 3IN X12FT (GAUZE/BANDAGES/DRESSINGS) IMPLANT
CHLORAPREP W/TINT 26 (MISCELLANEOUS) ×1 IMPLANT
CNTNR URN SCR LID CUP LEK RST (MISCELLANEOUS) ×1 IMPLANT
COVER BACK TABLE 60X90IN (DRAPES) ×1 IMPLANT
CUFF TOURN SGL QUICK 18X4 (TOURNIQUET CUFF) ×1 IMPLANT
CUFF TRNQT CYL 24X4X16.5-23 (TOURNIQUET CUFF) ×1 IMPLANT
DRAPE 3/4 80X56 (DRAPES) ×1 IMPLANT
DRAPE EXTREMITY T 121X128X90 (DISPOSABLE) ×1 IMPLANT
DRAPE SHEET LG 3/4 BI-LAMINATE (DRAPES) ×1 IMPLANT
DRAPE U-SHAPE 47X51 STRL (DRAPES) ×1 IMPLANT
DRSG VAC GRANUFOAM SM (GAUZE/BANDAGES/DRESSINGS) IMPLANT
ELECT REM PT RETURN 15FT ADLT (MISCELLANEOUS) ×1 IMPLANT
GAUZE SPONGE 4X4 12PLY STRL (GAUZE/BANDAGES/DRESSINGS) ×1 IMPLANT
GAUZE XEROFORM 1X8 LF (GAUZE/BANDAGES/DRESSINGS) ×1 IMPLANT
GLOVE BIO SURGEON STRL SZ7.5 (GLOVE) ×1 IMPLANT
GLOVE BIOGEL PI IND STRL 7.5 (GLOVE) ×1 IMPLANT
GOWN STRL REUS W/ TWL XL LVL3 (GOWN DISPOSABLE) ×1 IMPLANT
GRAFT SKIN WND MICRO 38 (Tissue) IMPLANT
KIT BASIN OR (CUSTOM PROCEDURE TRAY) ×1 IMPLANT
MANIFOLD NEPTUNE II (INSTRUMENTS) ×1 IMPLANT
MATRIX TISSUE MESHED 4X5 (Tissue) IMPLANT
NDL HYPO 25X1 1.5 SAFETY (NEEDLE) ×1 IMPLANT
NEEDLE HYPO 25X1 1.5 SAFETY (NEEDLE) ×1 IMPLANT
NS IRRIG 1000ML POUR BTL (IV SOLUTION) ×1 IMPLANT
PADDING CAST ABS COTTON 4X4 ST (CAST SUPPLIES) ×1 IMPLANT
SET INTERPULSE LAVAGE W/TIP (ORTHOPEDIC DISPOSABLE SUPPLIES) IMPLANT
SET IRRIG Y TYPE TUR BLADDER L (SET/KITS/TRAYS/PACK) ×1 IMPLANT
SPONGE T-LAP 4X18 ~~LOC~~+RFID (SPONGE) ×1 IMPLANT
STAPLER SKIN PROX WIDE 3.9 (STAPLE) ×1 IMPLANT
STOCKINETTE 6 STRL (DRAPES) ×1 IMPLANT
SUCTION TUBE FRAZIER 10FR DISP (SUCTIONS) ×1 IMPLANT
SUT ETHILON 3 0 PS 1 (SUTURE) ×1 IMPLANT
SUT ETHILON 4 0 PS 2 18 (SUTURE) ×1 IMPLANT
SUT MNCRL AB 3-0 PS2 18 (SUTURE) ×1 IMPLANT
SUT MNCRL AB 4-0 PS2 18 (SUTURE) ×1 IMPLANT
SUT VIC AB 2-0 SH 27XBRD (SUTURE) ×1 IMPLANT
SYR BULB EAR ULCER 3OZ GRN STR (SYRINGE) ×1 IMPLANT
SYR CONTROL 10ML LL (SYRINGE) ×1 IMPLANT
TUBE CONNECTING 12X1/4 (SUCTIONS) ×1 IMPLANT
TUBE IRRIGATION SET MISONIX (TUBING) ×1 IMPLANT
UNDERPAD 30X36 HEAVY ABSORB (UNDERPADS AND DIAPERS) ×1 IMPLANT
YANKAUER SUCT BULB TIP NO VENT (SUCTIONS) ×1 IMPLANT

## 2024-01-29 NOTE — Progress Notes (Signed)
 OT Cancellation Note  Patient Details Name: Dakota Becker MRN: [id number] DOB: 22-Jun-1976   Cancelled Treatment:    Reason Eval/Treat Not Completed: Patient at procedure or test/ unavailable;Other (comment) (plan for further debridement today. Will follow up afterward.)  Viraj Liby D Walton, OTD, OTR/L [facility] Acute Rehabilitation Office: [phone]   [name] 01/29/2024, 10:14 AM

## 2024-01-29 NOTE — Progress Notes (Signed)
 PROGRESS NOTE  Dakota Becker UKG:[id number] DOB: [birth year] DOA: 01/27/2024 PCP: Alejandro Hurt, FNP   LOS: 2 days   Brief narrative:   Dakota Becker is a 48 y.o. male with medical history significant of diabetes, essential hypertension, hyperlipidemia, with great toe ulcer developed some blisters.  Not taking his diabetic medication.  He went to PCP and was put on oral antibiotics and referred to podiatry.  Recommended wound management but he never got contacted so started having increasing swelling in the left toe with pain for 5 days or so with some chills and nausea ,vomiting and foul odor.  Patient then presented to [facility].  Upon arrival podiatry consulted.  Patient  met sepsis criteria secondary to osteomyelitis of the left great toe.  Patient was then taken to the OR and underwent left hallux amputation.  At this time patient is awaiting for repeat debridement on 01/29/2024 by podiatry.   Assessment/Plan: Principal Problem:   Toe osteomyelitis, left (HCC) Active Problems:   Gas gangrene of foot (HCC)   Gangrene of toe of left foot (HCC)   Controlled type 2 diabetes mellitus without complication, without long-term current use of insulin (HCC)   Hypercalcemia   Sepsis due to gas gangrene of left big toe with osteomyelitis:  Patient is status post partial first ray amputation of left foot with second toe amputation at MPJ level of the left foot as well as application of dissolvable antibiotic beads in the left foot.  Podiatry following and plans for repeat debridement today.  Continue linezolid and Zosyn.  Nonweightbearing.  Will consult ID.  Nausea decreased appetite.  Improved.  Was able to tolerate oral diet this morning.  Complains of constipation.  Will add stool softeners.   Diabetes mettlitus type II. Hemoglobin A1c more than 9.  Continue sliding scale insulin.  Diabetic education.  Emphasized compliance on medications.    Essential hypertension: Blood  pressure seems to be stable.  Not on medications at home.   Hypercalcemia: Latest calcium of 8.9.  Improved.   Lactic acidosis: Secondary to sepsis.  Improved.   Class I obesity. Body mass index is 32.29 kg/m.  Would benefit from lifestyle modification and weight loss as outpatient.  DVT prophylaxis: SCDs Start: 01/27/24 1954, hold Lovenox for next few days for possible need for repeat surgery.   Disposition: Home with home health likely in few days  Status is: Inpatient  Remains inpatient appropriate because: Amputation and debridement, for repeat debridement, IV antibiotic, pending clinical improvement  Code Status:     Code Status: Full Code  Family Communication: Spoke with the patient's wife at bedside 01/28/2024   Consultants: Podiatry Infectious disease.  Procedures: First ray amputation of the left foot with second toe amputation at MPJ level on 01/27/2024  Anti-infectives:  Linezolid and Zosyn  Anti-infectives (From admission, onward)    Start     Dose/Rate Route Frequency Ordered Stop   01/28/24 1130  piperacillin-tazobactam (ZOSYN) IVPB 3.375 g        3.375 g 12.5 mL/hr over 240 Minutes Intravenous Every 8 hours 01/28/24 1035     01/28/24 1000  metroNIDAZOLE (FLAGYL) IVPB 500 mg  Status:  Discontinued        500 mg 100 mL/hr over 60 Minutes Intravenous Every 12 hours 01/28/24 0925 01/28/24 1035   01/27/24 2200  linezolid (ZYVOX) IVPB 600 mg  Status:  Discontinued        600 mg 300 mL/hr over 60 Minutes Intravenous Every  12 hours 01/27/24 1953 01/27/24 2007   01/27/24 1835  vancomycin (VANCOCIN) powder  Status:  Discontinued          As needed 01/27/24 1835 01/27/24 1843   01/27/24 1834  tobramycin (NEBCIN) injection  Status:  Discontinued          As needed 01/27/24 1835 01/27/24 1843   01/27/24 1515  linezolid (ZYVOX) IVPB 600 mg        600 mg 300 mL/hr over 60 Minutes Intravenous Every 12 hours 01/27/24 1502     01/27/24 1515  piperacillin-tazobactam  (ZOSYN) IVPB 3.375 g        3.375 g 100 mL/hr over 30 Minutes Intravenous  Once 01/27/24 1505 01/27/24 1635   01/27/24 1430  vancomycin (VANCOCIN) IVPB 1000 mg/200 mL premix  Status:  Discontinued       Placed in "Followed by" Linked Group   1,000 mg 200 mL/hr over 60 Minutes Intravenous  Once 01/27/24 1419 01/27/24 1502   01/27/24 1430  vancomycin (VANCOCIN) IVPB 1000 mg/200 mL premix  Status:  Discontinued       Placed in "Followed by" Linked Group   1,000 mg 200 mL/hr over 60 Minutes Intravenous  Once 01/27/24 1419 01/27/24 1502   01/27/24 1415  Ampicillin-Sulbactam (UNASYN) 3 g in sodium chloride  0.9 % 100 mL IVPB  Status:  Discontinued       Placed in "And" Linked Group   3 g 200 mL/hr over 30 Minutes Intravenous  Once 01/27/24 1412 01/27/24 1635   01/27/24 1415  vancomycin (VANCOCIN) IVPB 1000 mg/200 mL premix  Status:  Discontinued       Placed in "And" Linked Group   1,000 mg 200 mL/hr over 60 Minutes Intravenous  Once 01/27/24 1412 01/27/24 1419       Subjective: Today, patient was seen and examined at bedside.  Patient seen prior to surgery.  Complains of mild pain.  No fever no nausea vomiting or abdominal pain.  Has not eaten anything after midnight for possible surgery today.  Objective: Vitals:   01/29/24 0408 01/29/24 0726  BP: (!) 164/86 (!) 148/80  Pulse: (!) 108 (!) 109  Resp: 18 16  Temp: 99.5 F (37.5 C) 98.7 F (37.1 C)  SpO2: 96% 95%    Intake/Output Summary (Last 24 hours) at 01/29/2024 0920 Last data filed at 01/29/2024 0824 Gross per 24 hour  Intake 3096.83 ml  Output 6375 ml  Net -3278.17 ml   Filed Weights   01/27/24 1953 01/28/24 0400 01/29/24 0408  Weight: 109.2 kg 110 kg 111 kg   Body mass index is 32.29 kg/m.   Physical Exam: GENERAL: Patient is alert awake and oriented. Not in obvious distress.  Obese built HENT: No scleral pallor or icterus. Pupils equally reactive to light. Oral mucosa is moist NECK: is supple, no gross swelling  noted. CHEST: Clear to auscultation. No crackles or wheezes.  Diminished breath sounds bilaterally. CVS: S1 and S2 heard, no murmur. Regular rate and rhythm.  ABDOMEN: Soft, non-tender, bowel sounds are present. EXTREMITIES: Left foot with dressing and splint. CNS: Cranial nerves are intact. No focal motor deficits. SKIN: warm and dry, left foot wound. Data Review: I have personally reviewed the following laboratory data and studies,  CBC: Recent Labs  Lab 01/27/24 1450 01/28/24 0351 01/29/24 0252  WBC 17.6* 18.4* 18.5*  NEUTROABS 16.4*  --   --   HGB 14.3 12.2* 12.3*  HCT 42.2 36.6* 37.6*  MCV 87.9 88.6 88.7  PLT  468* 387 399   Basic Metabolic Panel: Recent Labs  Lab 01/27/24 1450 01/28/24 0351 01/29/24 0252  NA 136 137 133*  K 3.9 3.9 4.0  CL 93* 102 99  CO2 19* 17* 21*  GLUCOSE 193* 137* 174*  BUN 12 9 9   CREATININE 0.98 1.12 0.91  CALCIUM 10.4* 8.9 9.1  MG  --   --  1.8   Liver Function Tests: Recent Labs  Lab 01/27/24 1450 01/28/24 0351  AST 34 25  ALT 18 23  ALKPHOS 191* 94  BILITOT 1.0 2.3*  PROT 8.0 6.8  ALBUMIN 3.8 2.2*   No results for input(s): "LIPASE", "AMYLASE" in the last 168 hours. No results for input(s): "AMMONIA" in the last 168 hours. Cardiac Enzymes: No results for input(s): "CKTOTAL", "CKMB", "CKMBINDEX", "TROPONINI" in the last 168 hours. BNP (last 3 results) No results for input(s): "BNP" in the last 8760 hours.  ProBNP (last 3 results) No results for input(s): "PROBNP" in the last 8760 hours.  CBG: Recent Labs  Lab 01/28/24 0756 01/28/24 1202 01/28/24 1732 01/28/24 2101 01/29/24 0656  GLUCAP 136* 188* 163* 151* 258*   Recent Results (from the past 240 hours)  Blood Cultures x 2 sites     Status: None (Preliminary result)   Collection Time: 01/27/24  2:55 PM   Specimen: BLOOD LEFT FOREARM  Result Value Ref Range Status   Specimen Description BLOOD LEFT FOREARM  Final   Special Requests   Final    Blood Culture  adequate volume BOTTLES DRAWN AEROBIC AND ANAEROBIC   Culture  Setup Time   Final    GRAM POSITIVE COCCI ANAEROBIC BOTTLE ONLY CRITICAL VALUE NOTED.  VALUE IS CONSISTENT WITH PREVIOUSLY REPORTED AND CALLED VALUE. Performed at [facility], [address], [place], [place] [address]    Culture GRAM POSITIVE COCCI  Final   Report Status PENDING  Incomplete  Blood Cultures x 2 sites     Status: None (Preliminary result)   Collection Time: 01/27/24  2:56 PM   Specimen: BLOOD  Result Value Ref Range Status   Specimen Description   Final    BLOOD RIGHT ANTECUBITAL Performed at [facility], [address] [place], [place] [address]    Special Requests   Final    Blood Culture adequate volume BOTTLES DRAWN AEROBIC AND ANAEROBIC   Culture  Setup Time   Final    GRAM POSITIVE COCCI IN CHAINS ANAEROBIC BOTTLE ONLY CRITICAL RESULT CALLED TO, READ BACK BY AND VERIFIED WITH: PHARMD Normie Becton [id number] AT 1956, ADC Performed at [facility], [address], [place], [place] [address]    Culture GRAM POSITIVE COCCI  Final   Report Status PENDING  Incomplete  Blood Culture ID Panel (Reflexed)     Status: Abnormal   Collection Time: 01/27/24  2:56 PM  Result Value Ref Range Status   Enterococcus faecalis NOT DETECTED NOT DETECTED Final   Enterococcus Faecium NOT DETECTED NOT DETECTED Final   Listeria monocytogenes NOT DETECTED NOT DETECTED Final   Staphylococcus species NOT DETECTED NOT DETECTED Final   Staphylococcus aureus (BCID) NOT DETECTED NOT DETECTED Final   Staphylococcus epidermidis NOT DETECTED NOT DETECTED Final   Staphylococcus lugdunensis NOT DETECTED NOT DETECTED Final   Streptococcus species DETECTED (A) NOT DETECTED Final    Comment: Not Enterococcus species, Streptococcus agalactiae, Streptococcus pyogenes, or Streptococcus pneumoniae. CRITICAL RESULT CALLED TO, READ BACK BY AND VERIFIED WITH: PHARMD TONY R. [id number] AT 1956, ADC  Streptococcus  agalactiae NOT DETECTED NOT DETECTED Final   Streptococcus pneumoniae NOT DETECTED NOT DETECTED Final   Streptococcus pyogenes NOT DETECTED NOT DETECTED Final   A.calcoaceticus-baumannii NOT DETECTED NOT DETECTED Final   Bacteroides fragilis NOT DETECTED NOT DETECTED Final   Enterobacterales NOT DETECTED NOT DETECTED Final   Enterobacter cloacae complex NOT DETECTED NOT DETECTED Final   Escherichia coli NOT DETECTED NOT DETECTED Final   Klebsiella aerogenes NOT DETECTED NOT DETECTED Final   Klebsiella oxytoca NOT DETECTED NOT DETECTED Final   Klebsiella pneumoniae NOT DETECTED NOT DETECTED Final   Proteus species NOT DETECTED NOT DETECTED Final   Salmonella species NOT DETECTED NOT DETECTED Final   Serratia marcescens NOT DETECTED NOT DETECTED Final   Haemophilus influenzae NOT DETECTED NOT DETECTED Final   Neisseria meningitidis NOT DETECTED NOT DETECTED Final   Pseudomonas aeruginosa NOT DETECTED NOT DETECTED Final   Stenotrophomonas maltophilia NOT DETECTED NOT DETECTED Final   Candida albicans NOT DETECTED NOT DETECTED Final   Candida auris NOT DETECTED NOT DETECTED Final   Candida glabrata NOT DETECTED NOT DETECTED Final   Candida krusei NOT DETECTED NOT DETECTED Final   Candida parapsilosis NOT DETECTED NOT DETECTED Final   Candida tropicalis NOT DETECTED NOT DETECTED Final   Cryptococcus neoformans/gattii NOT DETECTED NOT DETECTED Final    Comment: Performed at [facility], [address] [place], [place] [address]  Aerobic/Anaerobic Culture w Gram Stain (surgical/deep wound)     Status: None (Preliminary result)   Collection Time: 01/27/24  6:14 PM   Specimen: PATH Soft tissue resection  Result Value Ref Range Status   Specimen Description TISSUE  Final   Special Requests LEFT, 1ST RAY AMP  Final   Gram Stain   Final    NO WBC SEEN ABUNDANT GRAM POSITIVE COCCI MODERATE GRAM NEGATIVE RODS RARE GRAM POSITIVE RODS    Culture   Final    CULTURE REINCUBATED FOR  BETTER GROWTH MODERATE STREPTOCOCCUS GROUP F Beta hemolytic streptococci are predictably susceptible to penicillin and other beta lactams. Susceptibility testing not routinely performed. Performed at [facility], [address], [place], [place] [address]    Report Status PENDING  Incomplete  Aerobic/Anaerobic Culture w Gram Stain (surgical/deep wound)     Status: None (Preliminary result)   Collection Time: 01/27/24  6:15 PM   Specimen: PATH Bone resection; Tissue  Result Value Ref Range Status   Specimen Description BONE  Final   Special Requests LEFT, 1ST RAY BONE  Final   Gram Stain   Final    NO WBC SEEN MODERATE GRAM POSITIVE COCCI MODERATE GRAM NEGATIVE RODS FEW GRAM POSITIVE RODS    Culture   Final    CULTURE REINCUBATED FOR BETTER GROWTH MODERATE STREPTOCOCCUS GROUP F Beta hemolytic streptococci are predictably susceptible to penicillin and other beta lactams. Susceptibility testing not routinely performed. Performed at [facility], [address] [place], [place] [address]    Report Status PENDING  Incomplete     Studies: DG Foot 2 Views Left Result Date: 01/27/2024 CLINICAL DATA:  Postoperative. EXAM: LEFT FOOT - 2 VIEW COMPARISON:  Left foot x-ray 01/27/2024 FINDINGS: There is been interval amputation of the distal first metatarsal and first phalanx as well as the second phalanx. There is no acute fracture or dislocation. There are antibiotic seeds and soft tissue swelling over the surgical site. IMPRESSION: Interval amputation of the distal first metatarsal and first phalanx as well as the second phalanx. Electronically Signed  By: Tyron Gallon M.D.   On: 01/27/2024 19:08   CT Foot Left Wo Contrast Result Date: 01/27/2024 CLINICAL DATA:  Osteonecrosis suspected. Gangrene. Necrotizing fasciitis. EXAM: CT OF THE LEFT FOOT AND ANKLE WITHOUT CONTRAST TECHNIQUE: Multidetector CT imaging of the left foot was performed according to the standard protocol.  Multiplanar CT image reconstructions were also generated. RADIATION DOSE REDUCTION: This exam was performed according to the departmental dose-optimization program which includes automated exposure control, adjustment of the mA and/or kV according to patient size and/or use of iterative reconstruction technique. COMPARISON:  None Available. FINDINGS: Bones/Joint/Cartilage There is intraosseous gas in the phalanges of the great toe consistent with emphysematous osteomyelitis or osteonecrosis. There is slight fragmentation of the tuft of the distal phalanx of the great toe. No other acute fracture. There is no dislocation. The bones are well mineralized. No arthritic changes. Ligaments Suboptimally assessed by CT. Muscles and Tendons No intramuscular fluid collection. Soft tissues There is diffuse subcutaneous edema. Extensive soft tissue gas in the great toe and along the dorsal of the first metatarsal consistent with necrotizing fasciitis. There is irregularity of the skin of the distal great toe likely representing ulceration. IMPRESSION: Necrotizing fasciitis of the great toe with findings of emphysematous osteomyelitis or osteonecrosis. MRI may provide better evaluation if clinically indicated. Electronically Signed   By: Angus Bark M.D.   On: 01/27/2024 16:24   CT Ankle Left Wo Contrast Result Date: 01/27/2024 CLINICAL DATA:  Osteonecrosis suspected. Gangrene. Necrotizing fasciitis. EXAM: CT OF THE LEFT FOOT AND ANKLE WITHOUT CONTRAST TECHNIQUE: Multidetector CT imaging of the left foot was performed according to the standard protocol. Multiplanar CT image reconstructions were also generated. RADIATION DOSE REDUCTION: This exam was performed according to the departmental dose-optimization program which includes automated exposure control, adjustment of the mA and/or kV according to patient size and/or use of iterative reconstruction technique. COMPARISON:  None Available. FINDINGS: Bones/Joint/Cartilage  There is intraosseous gas in the phalanges of the great toe consistent with emphysematous osteomyelitis or osteonecrosis. There is slight fragmentation of the tuft of the distal phalanx of the great toe. No other acute fracture. There is no dislocation. The bones are well mineralized. No arthritic changes. Ligaments Suboptimally assessed by CT. Muscles and Tendons No intramuscular fluid collection. Soft tissues There is diffuse subcutaneous edema. Extensive soft tissue gas in the great toe and along the dorsal of the first metatarsal consistent with necrotizing fasciitis. There is irregularity of the skin of the distal great toe likely representing ulceration. IMPRESSION: Necrotizing fasciitis of the great toe with findings of emphysematous osteomyelitis or osteonecrosis. MRI may provide better evaluation if clinically indicated. Electronically Signed   By: Angus Bark M.D.   On: 01/27/2024 16:24   DG Foot Complete Left Result Date: 01/27/2024 CLINICAL DATA:  Gangrene of left and right great toes with toenail removal in April. EXAM: LEFT FOOT - COMPLETE 3+ VIEW COMPARISON:  04/10/2012. FINDINGS: There are destructive bony changes at the tuft and base of the distal phalanx of the first digit with fragmentation at the tuft. No dislocation is seen. There soft tissue swelling at the first digit with multiple skin defects and subcutaneous air at the first digit and extending over the dorsum of the first and second metatarsal. IMPRESSION: 1. Destructive changes involving the tuft and base of the distal phalanx of the first digit, suspicious for osteomyelitis. 2. Extensive subcutaneous emphysema involving the first digit extending over the dorsum of the first and second metatarsals. Electronically Signed  By: Wyvonnia Heimlich M.D.   On: 01/27/2024 15:01      Rosena Conradi, MD  [facility] 01/29/2024  If 7PM-7AM, please contact night-coverage

## 2024-01-29 NOTE — Progress Notes (Signed)
 Pt NPO since midnight for possible PR procedure. No concerns voiced

## 2024-01-29 NOTE — Anesthesia Preprocedure Evaluation (Addendum)
 Anesthesia Evaluation  Patient identified by MRN, date of birth, ID band Patient awake    Reviewed: Allergy & Precautions, NPO status , Patient's Chart, lab work & pertinent test results  Airway Mallampati: II  TM Distance: >3 FB Neck ROM: Full    Dental  (+) Teeth Intact, Dental Advisory Given   Pulmonary neg pulmonary ROS   breath sounds clear to auscultation       Cardiovascular hypertension, Pt. on medications  Rhythm:Regular Rate:Normal     Neuro/Psych negative neurological ROS  negative psych ROS   GI/Hepatic negative GI ROS, Neg liver ROS,,,  Endo/Other  diabetes, Type 2, Oral Hypoglycemic Agents    Renal/GU negative Renal ROS  negative genitourinary   Musculoskeletal negative musculoskeletal ROS (+)    Abdominal   Peds  Hematology negative hematology ROS (+)   Anesthesia Other Findings   Reproductive/Obstetrics                             Anesthesia Physical Anesthesia Plan  ASA: 2  Anesthesia Plan: MAC   Post-op Pain Management: Tylenol  PO (pre-op)*   Induction: Intravenous  PONV Risk Score and Plan: 2 and Ondansetron , Midazolam and Dexamethasone  Airway Management Planned: Natural Airway and Simple Face Mask  Additional Equipment: None  Intra-op Plan:   Post-operative Plan:   Informed Consent: I have reviewed the patients History and Physical, chart, labs and discussed the procedure including the risks, benefits and alternatives for the proposed anesthesia with the patient or authorized representative who has indicated his/her understanding and acceptance.     Dental advisory given  Plan Discussed with: CRNA  Anesthesia Plan Comments:        Anesthesia Quick Evaluation

## 2024-01-29 NOTE — Progress Notes (Signed)
 PT Cancellation Note  Patient Details Name: Dakota Becker MRN: [id number] DOB: 02-26-1976   Cancelled Treatment:    Reason Eval/Treat Not Completed: Patient at procedure or test/unavailable (Pt going for further debridement.  Will evaluate tomorrow.)   Florencia Hunter 01/29/2024, 10:05 AM Karess Harner M,PT Acute Rehab Services [phone]

## 2024-01-29 NOTE — Transfer of Care (Signed)
 Immediate Anesthesia Transfer of Care Note  Patient: Dakota Becker  Procedure(s) Performed: Left foot I&D, integra graft, vac app (Left)  Patient Location: PACU  Anesthesia Type:MAC  Level of Consciousness: awake, drowsy, and patient cooperative  Airway & Oxygen Therapy: Patient Spontanous Breathing  Post-op Assessment: Report given to RN and Post -op Vital signs reviewed and stable  Post vital signs: Reviewed and stable  Last Vitals:  Vitals Value Taken Time  BP 143/86 01/29/24 1238  Temp 99.1 F   Pulse 102   Resp 15 01/29/24 1241  SpO2 98%   Vitals shown include unfiled device data.  Last Pain:  Vitals:   01/29/24 1131  TempSrc:   PainSc: 0-No pain         Complications: No notable events documented.

## 2024-01-29 NOTE — Plan of Care (Signed)
  Problem: Education: Goal: Knowledge of General Education information will improve Description: Including pain rating scale, medication(s)/side effects and non-pharmacologic comfort measures Outcome: Progressing   Problem: Clinical Measurements: Goal: Ability to maintain clinical measurements within normal limits will improve Outcome: Progressing   Problem: Nutrition: Goal: Adequate nutrition will be maintained Outcome: Progressing   Problem: Pain Managment: Goal: General experience of comfort will improve and/or be controlled Outcome: Progressing   Problem: Safety: Goal: Ability to remain free from injury will improve Outcome: Progressing   Problem: Metabolic: Goal: Ability to maintain appropriate glucose levels will improve Outcome: Progressing   Problem: Skin Integrity: Goal: Risk for impaired skin integrity will decrease Outcome: Progressing

## 2024-01-29 NOTE — Progress Notes (Signed)
 Venous duplex and ABI were attempted, patient still down for surgery at 14:10.  Will re attempt as schedule and patient availability permit.     01/29/2024 2:11 PM Junell Cullifer RVT, RDMS

## 2024-01-29 NOTE — Consult Note (Signed)
 [facility]    Date of Admission:  01/27/2024   Total days of inpatient antibiotics 2        Reason for Consult: Bacteremia 2/2 wound infeciotn    Principal Problem:   Toe osteomyelitis, left (HCC) Active Problems:   Gas gangrene of foot (HCC)   Gangrene of toe of left foot (HCC)   Controlled type 2 diabetes mellitus without complication, without long-term current use of insulin (HCC)   Hypercalcemia   Assessment: 48 year old male with left foot wound which started blister parotic in March followed by podiatry, referred to wound care presented with left foot infection found to have: #Strep constellatus bacteremia secondary to left foot gas gangrene/osteomyelitis - CT of left foot showed necrotizing fasciitis of great toe, emphysema osteomyelitis or osteonecrosis -Patient febrile on admission.  Blood cultures grew strep constellatus.  He underwent first ray amputation second toe amputation at [facility] with podiatry.  Or cultures growing E faecalis and obstructive. - Plan for or today with podiatry  #Diabetes mellitus poorly controlled - A1c 9.8 on 01/27/2024 - Needs glycemic control for optimal wound healing Recommendations: - Discontinue pip-tazo - Start ceftriaxone -Continue linezolid - Follow blood and wound culture sensitivities - Follow OR findings and cultures - Repeat blood cultures - TTE - Standard precautions   Evaluation of this patient requires complex antimicrobial therapy evaluation and counseling + isolation needs for disease transmission risk assessment and mitigation   Microbiology:   Antibiotics:  Unasyn 5/2 5/3 linezolid-present Metronidazole 5/4 Pip-tazo 5/3-present Vancomycin 5/2 Cultures: Blood 5/22/2 sets growing strep constellatus Urine  Other 5/3 wound cultures growing E faecalis in group of stress  HPI: Dakota Becker is a 48 y.o. male with past medical history of diabetes melitis, essential hypertension,  hyperlipidemia with blisters on his foot followed by podiatry.  Presented with swelling of left foot, chills.  On arrival patient febrile, WBC 17K.  CT of left foot showed necrotizing fasciitis of great toe, emphysema osteomyelitis or osteonecrosis.  Podiatry was engaged and patient underwent first ray amputation left foot, second toe amputation at [facility].  ID engaged as blood cultures grew strep.   Review of Systems: ROS  Past Medical History:  Diagnosis Date   Diabetes mellitus without complication (HCC)    Hyperlipidemia    Hypertension     Social History   Tobacco Use   Smoking status: Never   Smokeless tobacco: Never  Vaping Use   Vaping status: Never Used  Substance Use Topics   Alcohol use: Yes    Alcohol/week: 0.0 - 2.0 standard drinks of alcohol   Drug use: No    Family History  Problem Relation Age of Onset   Diabetes Mother    Diabetes Father    Scheduled Meds:  acetaminophen   650 mg Rectal Once   insulin aspart  0-15 Units Subcutaneous TID WC   insulin aspart  0-5 Units Subcutaneous QHS   linezolid  600 mg Oral Q12H   pneumococcal 20-valent conjugate vaccine  0.5 mL Intramuscular Tomorrow-1000   Continuous Infusions:  cefTRIAXone (ROCEPHIN)  IV     PRN Meds:.acetaminophen  **OR** acetaminophen , mouth rinse, oxyCODONE -acetaminophen , prochlorperazine  No Known Allergies  OBJECTIVE: Blood pressure (!) 147/80, pulse (!) 105, temperature 99.1 F (37.3 C), temperature source Oral, resp. rate 20, height 6\' 1"  (1.854 m), weight 106.6 kg, SpO2 96%.  Physical Exam Constitutional:      General: He is not in acute distress.  Appearance: He is normal weight. He is not toxic-appearing.  HENT:     Head: Normocephalic and atraumatic.     Right Ear: External ear normal.     Left Ear: External ear normal.     Nose: No congestion or rhinorrhea.     Mouth/Throat:     Mouth: Mucous membranes are moist.     Pharynx: Oropharynx is clear.  Eyes:     Extraocular  Movements: Extraocular movements intact.     Conjunctiva/sclera: Conjunctivae normal.     Pupils: Pupils are equal, round, and reactive to light.  Cardiovascular:     Rate and Rhythm: Normal rate and regular rhythm.     Heart sounds: No murmur heard.    No friction rub. No gallop.  Pulmonary:     Effort: Pulmonary effort is normal.     Breath sounds: Normal breath sounds.  Abdominal:     General: Abdomen is flat. Bowel sounds are normal.     Palpations: Abdomen is soft.  Musculoskeletal:        General: No swelling.     Cervical back: Normal range of motion and neck supple.     Comments: Left foot wound bandaged  Skin:    General: Skin is warm and dry.  Neurological:     General: No focal deficit present.     Mental Status: He is oriented to person, place, and time.  Psychiatric:        Mood and Affect: Mood normal.     Lab Results Lab Results  Component Value Date   WBC 18.5 (H) 01/29/2024   HGB 12.3 (L) 01/29/2024   HCT 37.6 (L) 01/29/2024   MCV 88.7 01/29/2024   PLT 399 01/29/2024    Lab Results  Component Value Date   CREATININE 0.91 01/29/2024   BUN 9 01/29/2024   NA 133 (L) 01/29/2024   K 4.0 01/29/2024   CL 99 01/29/2024   CO2 21 (L) 01/29/2024    Lab Results  Component Value Date   ALT 23 01/28/2024   AST 25 01/28/2024   ALKPHOS 94 01/28/2024   BILITOT 2.3 (H) 01/28/2024       [name], MD [facility] 01/29/2024, 2:34 PM

## 2024-01-29 NOTE — Progress Notes (Signed)
 History and Physical Interval Note:  01/29/2024 11:28 AM  Earnest Goad  has presented today for surgery, with the diagnosis of open surgical site s/p prior partial first ray resection and 2nd toe amputation due to gas gangrene of left foot.  The various methods of treatment have been discussed with the patient and family. After consideration of risks, benefits and other options for treatment, the patient has consented to   Procedure(s) with comments: Left foot I&D, integra graft, vac app (Left) - Left foot I&D, integra graft, vac app, possible 2nd metatarsal resection, possible delayed primary closure if possible as a surgical intervention.  The patient's history has been reviewed, patient examined, no change in status, stable for surgery.  I have reviewed the patient's chart and labs.  Questions were answered to the patient's satisfaction.     Karlene Overcast Errick Salts

## 2024-01-29 NOTE — Op Note (Signed)
 Full Operative Report  Date of Operation: 12:56 PM, 01/29/2024   Patient: Dakota Becker - 48 y.o. male  Surgeon: Evertt Hoe, DPM   Assistant: None  Diagnosis: Gas gangrene, osteomyelitis, left foot  Procedure:  1.  Repeat irrigation and debridement partial second metatarsal resection, left foot 2.  Dermal allograft application 5 x 4 cm, left foot 3.  Application negative pressure wound therapy 5 x 4 cm, left foot    Anesthesia: Monitor Anesthesia Care  No responsible provider has been recorded for the case.  Anesthesiologist: Willian Harrow, MD CRNA: Annett Bart, CRNA; Rogge, Marine Sia, CRNA   Estimated Blood Loss: 20 mL  Hemostasis: 1) Anatomical dissection, mechanical compression, electrocautery 2) no tourniquet was used during procedure  Implants: Implant Name Type Inv. Item Serial No. Manufacturer Lot No. LRB No. Used Action  GRAFT SKIN WND MICRO 38 - [id number] Tissue GRAFT SKIN WND MICRO 38  [employer] [id number] Left 1 Implanted  MATRIX TISSUE MESHED 4X5 - [id number] Tissue MATRIX TISSUE MESHED 4X5  [employer] [id number] Left 1 Implanted    Materials: Prolene 3-0, skin staples  Injectables: 1) Pre-operatively: 20 cc of 50:50 mixture 1%lidocaine  plain and 0.5% marcaine plain 2) Post-operatively: None   Specimens: - Pathology: None - Microbiology: None   Antibiotics: IV antibiotics given per schedule on the floor  Drains: None  Complications: Patient tolerated the procedure well without complication.   Operative findings: As below in detailed report  Indications for Procedure: WILGUS SWEEN presents to Evertt Hoe, DPM with a chief complaint of open surgical site status post partial first ray amputation with second toe amputation for severe soft tissue and bone infection and gas gangrene 2 days ago.  Patient presenting for planned staged return to the operating room for additional debridement and closure versus  graft application with VAC. the patient has failed conservative treatments of various modalities. At this time the patient has elected to proceed with surgical correction. All alternatives, risks, and complications of the procedures were thoroughly explained to the patient. Patient exhibits appropriate understanding of all discussion points and informed consent was signed and obtained in the chart with no guarantees to surgical outcome given or implied.  Description of Procedure: Patient was brought to the operating room. Patient remained on their hospital bed in the supine position. A surgical timeout was performed and all members of the operating room, the procedure, and the surgical site were identified. anesthesia occurred as per anesthesia record. Local anesthetic as previously described was then injected about the operative field in a local infiltrative block.  The operative lower extremity as noted above was then prepped and draped in the usual sterile manner. The following procedure then began.  Attention was directed to the surgical site at the partial first ray amputation site on the left foot.  There was noted to be further necrotic tissues especially dorsally along the EHL tendon.  There was a small amount of purulence that was expressed from the dorsal aspect of the surgical site.  Further irrigation debridement was then performed with excision of any necrotic or fibrotic tissues any purulent areas were excised and there was noted to be significant necrosis of some of the soft tissues dorsally overlying the first metatarsal and first interspace.  Previously placed antibiotic beads were debrided out.  There was noted to be necrosis of the second metatarsal distally.  Decision was then made to resect the distal aspect of the second metatarsal for soft tissue closure  as well as concern for necrosis of the distal aspect of the second metatarsal.  Sagittal saw was then used to make a transverse  osteotomy through the base of the second metatarsal.  The bone was resected.  Then the base of the first metatarsal was also resected given the significant amount of exposed bone through the amputation site.  At this point partial closure of the distal aspect of the amputation site could be achieved.  Unfortunately due to its soft tissue loss there was an area dorsally that would require grafting and VAC application.  The surgical site was then irrigated thoroughly with 3 L of sterile saline via power pulse lavage.  The wound measured approximately 9 x 4 cm postdebridement.  Next Kerecis micronized dermal allograft 38 cm was implanted within the surgical cavity.  This was then partially closed over distally with 3-0 Prolene used to approximate the medial flap to the second ray amputation site.  Following this partial closure of the distal amputation site the wound measured approximately 5 x 4 cm.   Next decision was made to apply a Integra dermal allograft and wound VAC.  A piece of meshed Integra graft was then placed overlying the surgical wound.  The graft was adhered to the wound bed with staples.  Any residual graft was trimmed and then the matrix layer was removed from the silicone.  This was then implanted within the surgical cavity so that no graft was wasted.  Wound VAC was then applied.  Black foam sponge was cut to the above measurement of 5 x 4 cm.  This was placed overlying the Integra graft.  The black foam sponge was then adhered to the foot with derma tack wound VAC drape.  Wound VAC was attached set to 125 mm controlled continuous suction with no leaks detected.  The surgical site was then dressed with 4 x 4 gauze Kerlix Ace wrap. The patient tolerated both the procedure and anesthesia well with vital signs stable throughout. The patient was transferred in good condition and all vital signs stable  from the OR to recovery under the discretion of anesthesia.  Condition: Vital signs stable,  neurovascular status unchanged from preoperative   Surgical plan:  Amputation site appeared clean and free of gross infection status post repeat debridement.  The surgical site was partially closed distally.  Due to soft tissue loss and dorsally from necrosis and infection patient will require long-term wound VAC therapy to attempt healing by secondary intention.  He is at high risk for need for revision including transmetatarsal amputation.  Recommend strict nonweightbearing to the left foot.  Wound VAC will be first changed Friday.  ID has been consulted appreciate their recommendations for antibiotic therapy.  May benefit long-term therapy.  The patient will be nonweightbearing in a postop shoe to the operative limb with physical therapy but does not need to wear the postop shoe while in bed. The dressing is to remain clean, dry, and intact. Will continue to follow unless noted elsewhere.   Russ Course, DPM [facility]

## 2024-01-29 NOTE — Progress Notes (Signed)
 MEWS Progress Note  Patient Details Name: Dakota Becker MRN: [id number] DOB: 1975/11/07 Today's Date: 01/29/2024   MEWS Flowsheet Documentation:  Assess: MEWS Score Temp: 99.8 F (37.7 C) BP: 137/79 MAP (mmHg): 93 Pulse Rate: (!) 116 ECG Heart Rate: (!) 116 Resp: 20 Level of Consciousness: Alert SpO2: 94 % O2 Device: Room Air Patient Activity (if Appropriate): In bed O2 Flow Rate (L/min): 6 L/min Assess: MEWS Score MEWS Temp: 0 MEWS Systolic: 0 MEWS Pulse: 2 MEWS RR: 0 MEWS LOC: 0 MEWS Score: 2 MEWS Score Color: Yellow Assess: SIRS CRITERIA SIRS Temperature : 0 SIRS Respirations : 0 SIRS Pulse: 1 SIRS WBC: 0 SIRS Score Sum : 1 SIRS Temperature : 0 SIRS Pulse: 1 SIRS Respirations : 0 SIRS WBC: 1 SIRS Score Sum : 2 Assess: if the MEWS score is Yellow or Red Were vital signs accurate and taken at a resting state?: Yes Does the patient meet 2 or more of the SIRS criteria?: No Does the patient have a confirmed or suspected source of infection?: Yes MEWS guidelines implemented : Yes, yellow Treat MEWS Interventions: Considered administering scheduled or prn medications/treatments as ordered Take Vital Signs Increase Vital Sign Frequency : Yellow: Q2hr x1, continue Q4hrs until patient remains green for 12hrs Escalate MEWS: Escalate: Yellow: Discuss with charge nurse and consider notifying provider and/or RRT Notify: Charge Nurse/RN Name of Charge Nurse/RN Notified: James Mcardle, RN      Freida Jes 01/29/2024, 4:41 PM

## 2024-01-30 ENCOUNTER — Other Ambulatory Visit (HOSPITAL_COMMUNITY): Payer: Self-pay

## 2024-01-30 ENCOUNTER — Telehealth (HOSPITAL_COMMUNITY): Payer: Self-pay | Admitting: Pharmacy Technician

## 2024-01-30 ENCOUNTER — Inpatient Hospital Stay (HOSPITAL_COMMUNITY)

## 2024-01-30 ENCOUNTER — Encounter (HOSPITAL_COMMUNITY): Payer: Self-pay | Admitting: Podiatry

## 2024-01-30 ENCOUNTER — Other Ambulatory Visit (HOSPITAL_COMMUNITY)

## 2024-01-30 DIAGNOSIS — B9689 Other specified bacterial agents as the cause of diseases classified elsewhere: Secondary | ICD-10-CM | POA: Diagnosis not present

## 2024-01-30 DIAGNOSIS — M79662 Pain in left lower leg: Secondary | ICD-10-CM | POA: Diagnosis not present

## 2024-01-30 DIAGNOSIS — M869 Osteomyelitis, unspecified: Secondary | ICD-10-CM | POA: Diagnosis not present

## 2024-01-30 DIAGNOSIS — I70262 Atherosclerosis of native arteries of extremities with gangrene, left leg: Secondary | ICD-10-CM | POA: Diagnosis not present

## 2024-01-30 DIAGNOSIS — M86172 Other acute osteomyelitis, left ankle and foot: Secondary | ICD-10-CM | POA: Diagnosis not present

## 2024-01-30 DIAGNOSIS — E1169 Type 2 diabetes mellitus with other specified complication: Secondary | ICD-10-CM | POA: Diagnosis not present

## 2024-01-30 DIAGNOSIS — Z794 Long term (current) use of insulin: Secondary | ICD-10-CM | POA: Diagnosis not present

## 2024-01-30 LAB — CBC
HCT: 38 % — ABNORMAL LOW (ref 39.0–52.0)
Hemoglobin: 12.6 g/dL — ABNORMAL LOW (ref 13.0–17.0)
MCH: 29.3 pg (ref 26.0–34.0)
MCHC: 33.2 g/dL (ref 30.0–36.0)
MCV: 88.4 fL (ref 80.0–100.0)
Platelets: 405 10*3/uL — ABNORMAL HIGH (ref 150–400)
RBC: 4.3 MIL/uL (ref 4.22–5.81)
RDW: 13.2 % (ref 11.5–15.5)
WBC: 19 10*3/uL — ABNORMAL HIGH (ref 4.0–10.5)
nRBC: 0 % (ref 0.0–0.2)

## 2024-01-30 LAB — AEROBIC/ANAEROBIC CULTURE W GRAM STAIN (SURGICAL/DEEP WOUND)
Gram Stain: NONE SEEN
Gram Stain: NONE SEEN

## 2024-01-30 LAB — CULTURE, BLOOD (ROUTINE X 2)
Special Requests: ADEQUATE
Special Requests: ADEQUATE

## 2024-01-30 LAB — BASIC METABOLIC PANEL WITH GFR
Anion gap: 15 (ref 5–15)
BUN: 7 mg/dL (ref 6–20)
CO2: 20 mmol/L — ABNORMAL LOW (ref 22–32)
Calcium: 9.1 mg/dL (ref 8.9–10.3)
Chloride: 99 mmol/L (ref 98–111)
Creatinine, Ser: 0.89 mg/dL (ref 0.61–1.24)
GFR, Estimated: >60 mL/min (ref >60)
Glucose, Bld: 204 mg/dL — ABNORMAL HIGH (ref 70–99)
Potassium: 4.1 mmol/L (ref 3.5–5.1)
Sodium: 134 mmol/L — ABNORMAL LOW (ref 135–145)

## 2024-01-30 LAB — PHOSPHORUS: Phosphorus: 3.3 mg/dL (ref 2.5–4.6)

## 2024-01-30 LAB — GLUCOSE, CAPILLARY
Glucose-Capillary: 220 mg/dL — ABNORMAL HIGH (ref 70–99)
Glucose-Capillary: 234 mg/dL — ABNORMAL HIGH (ref 70–99)
Glucose-Capillary: 182 mg/dL — ABNORMAL HIGH (ref 70–99)
Glucose-Capillary: 279 mg/dL — ABNORMAL HIGH (ref 70–99)

## 2024-01-30 LAB — SURGICAL PATHOLOGY

## 2024-01-30 MED ORDER — MUPIROCIN 2 % EX OINT
TOPICAL_OINTMENT | Freq: Every day | CUTANEOUS | Status: DC
  Filled 2024-01-30 (×2): qty 22

## 2024-01-30 MED ORDER — APIXABAN 5 MG PO TABS
10.0000 mg | ORAL_TABLET | Freq: Two times a day (BID) | ORAL | Status: AC
  Administered 2024-01-30 – 2024-02-05 (×13): 10 mg via ORAL
  Filled 2024-01-30 (×14): qty 2

## 2024-01-30 MED ORDER — APIXABAN 5 MG PO TABS
5.0000 mg | ORAL_TABLET | Freq: Two times a day (BID) | ORAL | Status: DC
  Filled 2024-01-30: qty 1

## 2024-01-30 MED ORDER — SODIUM CHLORIDE 0.9 % IV SOLN
3.0000 g | Freq: Four times a day (QID) | INTRAVENOUS | Status: DC
  Administered 2024-01-30 – 2024-02-02 (×13): 3 g via INTRAVENOUS
  Filled 2024-01-30 (×13): qty 8

## 2024-01-30 MED ORDER — MUPIROCIN 2 % EX OINT: TOPICAL_OINTMENT | Freq: Every day | CUTANEOUS | Status: DC

## 2024-01-30 NOTE — Progress Notes (Signed)
 [facility]  Date of Admission:  01/27/2024   Total days of inpatient antibiotics 2  Principal Problem:   Toe osteomyelitis, left (HCC) Active Problems:   Gas gangrene of foot (HCC)   Gangrene of toe of left foot (HCC)   Controlled type 2 diabetes mellitus without complication, without long-term current use of insulin (HCC)   Hypercalcemia          Assessment: 48 year old male with left foot wound which started blister parotic in March followed by podiatry, referred to wound care presented with left foot infection found to have: #Strep constellatus bacteremia secondary to left foot gas gangrene/osteomyelitis - CT of left foot showed necrotizing fasciitis of great toe, emphysema osteomyelitis or osteonecrosis -Patient febrile on admission.  Blood cultures grew strep constellatus.  He underwent first ray amputation second toe amputation at [facility] with podiatry.  Or cultures growing E faecalis and obstructive. -5/3 Or cultures grew E faecalis pansensitive, group of Ceftin, Prevotella species beta-lactamase positive. - Taken to the OR again on 5/5 for repeat I&D and second metatarsal resection, no cultures.  Patient has wound VAC on at this point.   #Diabetes mellitus poorly controlled - A1c 9.8 on 01/27/2024 - Needs glycemic control for optimal wound healing  Recommendations: -Unasyn -Continue linezolid -Follow surgical plan -TTE ordered, await results. - Will repeat blood cultures to ensure clearance. -Standard precautions  Evaluation of this patient requires complex antimicrobial therapy evaluation and counseling + isolation needs for disease transmission risk assessment and mitigation   Microbiology:   Antibiotics: Unasyn 5/2, present 5/3 linezolid-present Metronidazole 5/4 Pip-tazo 5/3 Vancomycin 5/2 Cultures: Blood 5/22/2 sets growing strep constellatus Urine   Other 5/3 wound cultures growing E faecalis in group of  stress   SUBJECTIVE: Resting in bed  Interval: Afebrile overnight, wbc 19k  Review of Systems: Review of Systems  All other systems reviewed and are negative.    Scheduled Meds:  acetaminophen   650 mg Rectal Once   apixaban  10 mg Oral BID   Followed by   Cecily Cohen ON 02/06/2024] apixaban  5 mg Oral BID   insulin aspart  0-15 Units Subcutaneous TID WC   insulin aspart  0-5 Units Subcutaneous QHS   linezolid  600 mg Oral Q12H   mupirocin ointment   Topical Daily   pneumococcal 20-valent conjugate vaccine  0.5 mL Intramuscular Tomorrow-1000   Continuous Infusions:  ampicillin-sulbactam (UNASYN) IV 3 g (01/30/24 1139)   PRN Meds:.acetaminophen  **OR** acetaminophen , mouth rinse, oxyCODONE -acetaminophen , prochlorperazine  No Known Allergies  OBJECTIVE: Vitals:   01/29/24 1936 01/30/24 0421 01/30/24 0725 01/30/24 1125  BP: (!) 142/82 139/81 (!) 144/86 (!) 152/83  Pulse:   98   Resp: 18 18 18 16   Temp: 97.7 F (36.5 C) 97.7 F (36.5 C) 99.1 F (37.3 C) 98.7 F (37.1 C)  TempSrc: Oral Oral Oral Oral  SpO2: 96% 94% 97% 97%  Weight:  112.9 kg    Height:       Body mass index is 32.84 kg/m.  Physical Exam Constitutional:      General: He is not in acute distress.    Appearance: He is normal weight. He is not toxic-appearing.  HENT:     Head: Normocephalic and atraumatic.     Right Ear: External ear normal.     Left Ear: External ear normal.     Nose: No congestion or rhinorrhea.     Mouth/Throat:     Mouth: Mucous  membranes are moist.     Pharynx: Oropharynx is clear.  Eyes:     Extraocular Movements: Extraocular movements intact.     Conjunctiva/sclera: Conjunctivae normal.     Pupils: Pupils are equal, round, and reactive to light.  Cardiovascular:     Rate and Rhythm: Normal rate and regular rhythm.     Heart sounds: No murmur heard.    No friction rub. No gallop.  Pulmonary:     Effort: Pulmonary effort is normal.     Breath sounds: Normal breath sounds.   Abdominal:     General: Abdomen is flat. Bowel sounds are normal.     Palpations: Abdomen is soft.  Musculoskeletal:        General: No swelling.     Cervical back: Normal range of motion and neck supple.     Comments: Wound VAC  Skin:    General: Skin is warm and dry.  Neurological:     General: No focal deficit present.     Mental Status: He is oriented to person, place, and time.  Psychiatric:        Mood and Affect: Mood normal.       Lab Results Lab Results  Component Value Date   WBC 19.0 (H) 01/30/2024   HGB 12.6 (L) 01/30/2024   HCT 38.0 (L) 01/30/2024   MCV 88.4 01/30/2024   PLT 405 (H) 01/30/2024    Lab Results  Component Value Date   CREATININE 0.89 01/30/2024   BUN 7 01/30/2024   NA 134 (L) 01/30/2024   K 4.1 01/30/2024   CL 99 01/30/2024   CO2 20 (L) 01/30/2024    Lab Results  Component Value Date   ALT 23 01/28/2024   AST 25 01/28/2024   ALKPHOS 94 01/28/2024   BILITOT 2.3 (H) 01/28/2024        [name], MD [facility] 01/30/2024, 2:56 PM

## 2024-01-30 NOTE — Consult Note (Signed)
 WOC consult requested for foot wounds.  Podiatry is following for assessment and plan of care; please refer to their team for further questions regarding plan of care.  Please re-consult if further assistance is needed.  Thank-you,  Wiliam Harder MSN, RN, CWOCN, [place], CNS [phone]

## 2024-01-30 NOTE — Anesthesia Postprocedure Evaluation (Signed)
 Anesthesia Post Note  Patient: Dakota Becker  Procedure(s) Performed: Left foot I&D, integra graft, vac app (Left)     Patient location during evaluation: PACU Anesthesia Type: MAC Level of consciousness: awake and alert Pain management: pain level controlled Vital Signs Assessment: post-procedure vital signs reviewed and stable Respiratory status: spontaneous breathing, nonlabored ventilation, respiratory function stable and patient connected to nasal cannula oxygen Cardiovascular status: stable and blood pressure returned to baseline Postop Assessment: no apparent nausea or vomiting Anesthetic complications: no   No notable events documented.               Merlin Ege D Kaliyan Osbourn

## 2024-01-30 NOTE — Progress Notes (Signed)
 Inpatient Rehab Admissions Coordinator:   Per therapy recommendations pt was screened for CIR by Loye Rumble, PT, DPT.  At this time pt does not appear to demonstrate the medical necessity to support an insurance approval for CIR admission.  We will not pursue.    Loye Rumble, PT, DPT Admissions Coordinator [phone] 01/30/24  4:44 PM

## 2024-01-30 NOTE — Discharge Instructions (Signed)
 Information on my medicine - ELIQUIS (apixaban)  Why was Eliquis prescribed for you? Eliquis was prescribed to treat blood clots that may have been found in the veins of your legs (deep vein thrombosis) or in your lungs (pulmonary embolism) and to reduce the risk of them occurring again.  What do You need to know about Eliquis ? The starting dose is 10 mg (two 5 mg tablets) taken TWICE daily for the FIRST SEVEN (7) DAYS, then on 5/13 in the morning  the dose is reduced to ONE 5 mg tablet taken TWICE daily.  Eliquis may be taken with or without food.   Try to take the dose about the same time in the morning and in the evening. If you have difficulty swallowing the tablet whole please discuss with your pharmacist how to take the medication safely.  Take Eliquis exactly as prescribed and DO NOT stop taking Eliquis without talking to the doctor who prescribed the medication.  Stopping may increase your risk of developing a new blood clot.  Refill your prescription before you run out.  After discharge, you should have regular check-up appointments with your healthcare provider that is prescribing your Eliquis.    What do you do if you miss a dose? If a dose of ELIQUIS is not taken at the scheduled time, take it as soon as possible on the same day and twice-daily administration should be resumed. The dose should not be doubled to make up for a missed dose.  Important Safety Information A possible side effect of Eliquis is bleeding. You should call your healthcare provider right away if you experience any of the following: Bleeding from an injury or your nose that does not stop. Unusual colored urine (red or dark brown) or unusual colored stools (red or black). Unusual bruising for unknown reasons. A serious fall or if you hit your head (even if there is no bleeding).  Some medicines may interact with Eliquis and might increase your risk of bleeding or clotting while on Eliquis. To help  avoid this, consult your healthcare provider or pharmacist prior to using any new prescription or non-prescription medications, including herbals, vitamins, non-steroidal anti-inflammatory drugs (NSAIDs) and supplements.  This website has more information on Eliquis (apixaban): http://www.eliquis.com/eliquis/home

## 2024-01-30 NOTE — Progress Notes (Signed)
 Chaplain met pt's mother in elevator subsequent to visiting her son.  Pt turned off television and  in conversation that ensued spoke highly of his mother.  Pt asked if Chaplain had come to pray with him.  Chaplain prayed for pt's healing and restoration to his life that he might pursue whatever the [facility] has in store for him.  Pt was moved to tears and reached out for a hug.  Chaplain warmly embraced [employer].  Alexis Anger Chaplain

## 2024-01-30 NOTE — Telephone Encounter (Signed)
 Patient Product/process development scientist completed.    The patient is insured through [employer]. Patient has [employer], may use a copay card, and/or apply for patient assistance if available.    Ran test claim for Eliquis 5 mg and the current 30 day co-pay is $75.00.   This test claim was processed through [facility]- copay amounts may vary at other pharmacies due to pharmacy/plan contracts, or as the patient moves through the different stages of their insurance plan.     Roland Earl, CPHT Pharmacy Technician III Certified Patient Advocate [facility] Patient Advocate Team Direct Number: [phone]  Fax: [phone]

## 2024-01-30 NOTE — Evaluation (Signed)
 Physical Therapy Evaluation Patient Details Name: Dakota Becker MRN: [id number] DOB: [birth year] Today's Date: 01/30/2024  History of Present Illness  Pt is a 48 y.o. male presenting 5/3 with L foot pain, blisters on L great toe. Found to have gangrene, osteomyelitis of first ray and 2nd toe. 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 Underwent L foot partial second MT resection and wound vac placement on 01/29/24.  PMH significant for DM, HTN, HLD.  Clinical Impression  Patient presents with decreased mobility due to generalized weakness, decreased balance, decreased activity tolerance and pain with decreased ROM L foot.  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  Today pt needing CGA to min A for couple hops keeping L foot NWB with RW and limited by HR elevation to 145 and diaphoresis.  Patient will benefit from continued skilled PT in the acute setting.  Depending on progress he may benefit from post-acute inpatient rehab prior to d/c home with family support.         If plan is discharge home, recommend the following: A little help with walking and/or transfers;A little help with bathing/dressing/bathroom;Assistance with cooking/housework;Assist for transportation;Help with stairs or ramp for entrance   Can travel by private vehicle        Equipment Recommendations Crutches;Rolling walker (2 wheels);BSC/3in1  Recommendations for Other Services  Rehab consult    Functional Status Assessment Patient has had a recent decline in their functional status and demonstrates the ability to make significant improvements in function in a reasonable and predictable amount of time.     Precautions / Restrictions Precautions Precautions: Fall Required Braces or Orthoses: Other Brace Other Brace: post-op shoe on L Restrictions Weight Bearing Restrictions Per Provider Order: Yes LLE Weight Bearing  Per Provider Order: Non weight bearing      Mobility  Bed Mobility Overal bed mobility: Needs Assistance Bed Mobility: Supine to Sit, Sit to Supine     Supine to sit: Supervision, Used rails, HOB elevated Sit to supine: Supervision   General bed mobility comments: increased time, assisting for lines,    Transfers Overall transfer level: Needs assistance Equipment used: Rolling walker (2 wheels) Transfers: Sit to/from Stand, Bed to chair/wheelchair/BSC Sit to Stand: Contact guard assist   Step pivot transfers: Contact guard assist       General transfer comment: increased time, cues for hand placement heavy UE reliance maintaining NWB on L; vascular arriving to take pt to ultrasound so assisted back to bed with RW and A for balance    Ambulation/Gait Ambulation/Gait assistance: Min assist Gait Distance (Feet): 3 Feet Assistive device: Rolling walker (2 wheels) Gait Pattern/deviations: Step-to pattern, Trunk flexed, Decreased stride length       General Gait Details: hop to technique, limited by HR elevation and diaphoresis  Stairs            Wheelchair Mobility     Tilt Bed    Modified Rankin (Stroke Patients Only)       Balance Overall balance assessment: Needs assistance   Sitting balance-Leahy Scale: Good     Standing balance support: Bilateral upper extremity supported Standing balance-Leahy Scale: Poor Standing balance comment: due to maintaining NWB on L                             Pertinent Vitals/Pain Pain Assessment Pain Assessment: 0-10 Pain Score: 8  Pain Location: L foot with dependency Pain Descriptors / Indicators: Throbbing, Aching Pain Intervention(s): Monitored during session, Repositioned, RN gave pain meds during session    Home Living Family/patient expects to be discharged to:: Private residence Living Arrangements: Spouse/significant other Available Help at Discharge: Family;Available PRN/intermittently  (wife works as Runner, broadcasting/film/video, pt's father can assist initially) Type of Home: Apartment Home Access: Stairs to enter Entrance Stairs-Rails: Right Entrance Stairs-Number of Steps: flight   Home Layout: One level Home Equipment: None 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 Function Prior Level of Function : Independent/Modified Independent;Working/employed;Driving                     Extremity/Trunk Assessment   Upper Extremity Assessment Upper Extremity Assessment: Overall WFL for tasks assessed    Lower Extremity Assessment Lower Extremity Assessment: LLE deficits/detail LLE Deficits / Details: foot wrapped with gauze and ace wrap with wound vac attached, lifts leg antigravity unaided       Communication   Communication Communication: No apparent difficulties    Cognition Arousal: Alert Behavior During Therapy: WFL for tasks assessed/performed   PT - Cognitive impairments: No apparent impairments                         Following commands: Intact       Cueing Cueing Techniques: Verbal cues     General Comments General comments (skin integrity, edema, etc.): Wife in the room and very supportive, pt with HR to 145 with couple hops so brought chair for pt to sit to rest noted diaphoresis, RN aware    Exercises     Assessment/Plan    PT Assessment Patient needs continued PT services  PT Problem List Decreased strength;Decreased mobility;Decreased balance;Decreased knowledge of use of DME;Pain;Decreased activity tolerance;Decreased range of motion       PT Treatment Interventions DME instruction;Therapeutic exercise;Gait training;Balance training;Stair training;Functional mobility training;Therapeutic activities;Patient/family education    PT Goals (Current goals can be found in the Care Plan section)  Acute Rehab PT Goals Patient Stated Goal: return to independent PT Goal Formulation: With patient/family Time For Goal Achievement:  02/13/24 Potential to Achieve Goals: Good    Frequency Min 2X/week     Co-evaluation               AM-PAC PT "6 Clicks" Mobility  Outcome Measure Help needed turning from your back to your side while in a flat bed without using bedrails?: A Little Help needed moving from lying on your back to sitting on the side of a flat bed without using bedrails?: A Little Help needed moving to and from a bed to a chair (including a wheelchair)?: A Little Help needed standing up from a chair using your arms (e.g., wheelchair or bedside chair)?: A Little Help needed to walk in hospital room?: Total Help needed climbing 3-5 steps with a railing? : Total 6 Click Score: 14    End of Session Equipment Utilized During Treatment: Gait belt;Other (comment) (post-op shoe) Activity Tolerance: Treatment limited secondary to medical complications (Comment) Patient left: in bed;with call bell/phone within reach;with family/visitor present;with nursing/sitter in room   PT Visit Diagnosis: Difficulty in walking, not elsewhere classified (R26.2);Pain Pain - Right/Left: Left Pain - part of body: Ankle and joints of foot    Time: 1025-1100 PT Time Calculation (min) (ACUTE ONLY): 35 min   Charges:   PT Evaluation $PT Eval Moderate Complexity: 1 Mod PT Treatments $Therapeutic Activity: 8-22  mins PT General Charges $$ ACUTE PT VISIT: 1 Visit         Abigail Hoff, PT Acute Rehabilitation Services Office:[phone] 01/30/2024   Marley Simmers 01/30/2024, 11:36 AM

## 2024-01-30 NOTE — Progress Notes (Signed)
 PODIATRY PROGRESS NOTE Patient Name: Dakota Becker  DOB 07-23-1976 DOA 01/27/2024  Hospital Day: 4  Assessment:  48 y.o. 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 Partial first ray amputation of left foot 2. 2nd toe amputation at MPJ level of left foot 3. Application dissolvable antibiotic beads left foot  DOS: 01/29/24 1.  Repeat irrigation and debridement partial second metatarsal resection, left foot 2.  Dermal allograft application 5 x 4 cm, left foot 3.  Application negative pressure wound therapy 5 x 4 cm, left foot      AF, mild tachycardia WBC 19  Path: pending  Cultures:   MODERATE ENTEROCOCCUS FAECALIS MODERATE STREPTOCOCCUS GROUP F FEW PREVOTELLA SPECIES    CT L foot: Soft tissue emphysema of 1st ray, intraosseous gas in phalanges of great toe, osteomyelitis great toe  Plan:  - S/p Repeat debridement with partial 2nd met resection, graft application partial closure of surgical site.  - Ordered Venous US  LLE to r/o DVT given left calf pain - going for that as well as ABI testing today.  - Continue to monitor WBC - if not improving over next few days may require additional imaging to eval for further foci of infection possible MRI ankle - he is at high risk for needing further surgery and limb loss given severity of infection and uncontrolled DM - Continue IV abx broad spectrum pending further culture data - Appreciate ID recs - Anticoagulation: ok to resume anticoagulation per primary - Wound care: Leave surgical dressing in place including wound vac first change will be Thursday or firday.  - WB status: NWB LLE, does not need to wear Post op shoe while in bed - Will continue to follow        Maridee Shoemaker, DPM [facility]    Subjective:  Discussed findings from OR yesterday as well as plans going forward,  he is still reporting lower leg pain in the calf as well as some p[ain in the ankle.   Objective:   Vitals:   01/30/24 0725 01/30/24 1125  BP: (!) 144/86 (!) 152/83  Pulse: 98   Resp: 18 16  Temp: 99.1 F (37.3 C) 98.7 F (37.1 C)  SpO2: 97% 97%       Latest Ref Rng & Units 01/30/2024    3:57 AM 01/29/2024    2:52 AM 01/28/2024    3:51 AM  CBC  WBC 4.0 - 10.5 K/uL 19.0  18.5  18.4   Hemoglobin 13.0 - 17.0 g/dL 16.1  09.6  04.5   Hematocrit 39.0 - 52.0 % 38.0  37.6  36.6   Platelets 150 - 400 K/uL 405  399  387        Latest Ref Rng & Units 01/30/2024    3:57 AM 01/29/2024    2:52 AM 01/28/2024    3:51 AM  BMP  Glucose 70 - 99 mg/dL 409  811  914   BUN 6 - 20 mg/dL 7  9  9    Creatinine 0.61 - 1.24 mg/dL 7.82  9.56  2.13   Sodium 135 - 145 mmol/L 134  133  137   Potassium 3.5 - 5.1 mmol/L 4.1  4.0  3.9   Chloride 98 - 111 mmol/L 99  99  102   CO2 22 - 32 mmol/L 20  21  17   Calcium 8.9 - 10.3 mg/dL 9.1  9.1  8.9     General: AAOx3, NAD  Lower Extremity Exam Dressing C/D/I to left foot Wound vac intact and functioning 100 mL serosanguinous in canister Pain with compression of left calf Able to DF/PF left ankle without severe pain or limitation just says calf feels tight   Radiology:  Results reviewed. See assessment for pertinent imaging results

## 2024-01-30 NOTE — Inpatient Diabetes Management (Signed)
 Inpatient Diabetes Program Recommendations  AACE/ADA: New Consensus Statement on Inpatient Glycemic Control (2015)  Target Ranges:  Prepandial:   less than 140 mg/dL      Peak postprandial:   less than 180 mg/dL (1-2 hours)      Critically ill patients:  140 - 180 mg/dL   Lab Results  Component Value Date   GLUCAP 182 (H) 01/30/2024   HGBA1C 9.8 (H) 01/27/2024    Review of Glycemic Control  Latest Reference Range & Units 01/29/24 12:43 01/29/24 14:28 01/29/24 16:32 01/29/24 20:44 01/30/24 05:51 01/30/24 11:26 01/30/24 15:20  Glucose-Capillary 70 - 99 mg/dL 540 (H) 981 (H) 191 (H) 189 (H) 220 (H) 234 (H) 182 (H)   Diabetes history: DM 2 Outpatient Diabetes Medications: Glipizide 10 mg bid, Jardiance 10 mg Daily, Rybelsus 3 mg Daily Current orders for Inpatient glycemic control:  Novolog 0-15 units tid + hs  A1c 9.8% on 5/3 PCP: Sharry Deem, FNP  Spoke with pt at bedside regarding A1c of 9.8% and glucose control at home. Pt reports starting his medications back about 2 weeks ago. Pt reports trying to check glucose by fingerstick but is interested in CGM to monitor trends at home. Discussed current A1c level of 9.8 % and reviewed glucose and A1c goals. Emphasized the importance of glucose control on wound healing. Told pt to consistently take oral medications and check glucose tid before meals if he cannot get the CGM. Also told pt if glucose trends are not 150 and below he needs adjustments in his medications to keep his levels at goal.  Since pt is newly on his oral medications for 2 weeks A1c is not a reflective of his baseline at home in addition to the stress response of the active infection. I recommend pt take oral medications and check glucose tid. If uncontrolled close PCP follow up for tight glucose control <150. Pt is aware and understands.  Thanks, Eloise Hake RN, MSN, BC-ADM Inpatient Diabetes Coordinator Team Pager [phone] (8a-5p)

## 2024-01-30 NOTE — Plan of Care (Signed)

## 2024-01-30 NOTE — TOC Progression Note (Signed)
 Transition of Care [facility]) - Progression Note    Patient Details  Name: Dakota Becker MRN: [id number] Date of Birth: 13-Jul-1976  [facility] CM/SW Contact  Jennett Model, RN Phone Number: 01/30/2024, 4:42 PM  Clinical Narrative:    NCM left vm for [facility] [employer] for a wound vac for patient.  He will need one to go home with per Dr. Alexandr Standford.   Expected Discharge Plan:  (TBD) Barriers to Discharge: Continued Medical Work up, Other (must enter comment) Nutritional therapist on dispostion)  Expected Discharge Plan and Services In-house Referral: Clinical Social Work     Living arrangements for the past 2 months: Single Family Home                                       Social Determinants of Health (SDOH) Interventions SDOH Screenings   Food Insecurity: No Food Insecurity (01/27/2024)  Housing: Low Risk  (01/27/2024)  Transportation Needs: No Transportation Needs (01/27/2024)  Utilities: Not At Risk (01/27/2024)  Social Connections: Socially Integrated (01/27/2024)  Tobacco Use: Low Risk  (01/29/2024)    Readmission Risk Interventions     No data to display

## 2024-01-30 NOTE — Progress Notes (Addendum)
 PROGRESS NOTE  Dakota Becker ZOX:[id number] DOB: [birth year] DOA: 01/27/2024 PCP: Alejandro Hurt, FNP   LOS: 3 days   Brief narrative:   Dakota Becker is a 48 y.o. male with medical history significant of diabetes, essential hypertension, hyperlipidemia, with great toe ulcer developed some blisters.  Not taking his diabetic medication.  He went to PCP and was put on oral antibiotics and referred to podiatry.  Recommended wound management but he never got contacted so started having increasing swelling in the left toe with pain for 5 days or so with some chills and nausea ,vomiting and foul odor.  [facility] then [facility].  Upon arrival to the ED podiatry was consulted.  Patient  met sepsis criteria secondary to osteomyelitis of the left great toe.  Patient was then taken to the OR and underwent left hallux amputation.  Patient underwent repeat debridement on 01/29/2024 by podiatry and ID  has been consulted for antibiotic management..  Assessment/Plan: Principal Problem:   Toe osteomyelitis, left (HCC) Active Problems:   Gas gangrene of foot (HCC)   Gangrene of toe of left foot (HCC)   Controlled type 2 diabetes mellitus without complication, without long-term current use of insulin (HCC)   Hypercalcemia   Sepsis due to gas gangrene of left big toe with osteomyelitis:  Streptococcus bacteremia Patient underwent surgical intervention on admission with post partial first ray amputation of left foot with second toe amputation at MPJ level of the left foot and  application of dissolvable antibiotic beads in the left foot.  Podiatry following and underwent repeat irrigation and debridement with allograft application and wound VAC treatment on 01/29/2024.  As per podiatry he is at high risk for need for revision including transmetatarsal amputation.  ID has been consulted in patient is currently on antibiotic including linezolid and Unasyn podiatry recommends  nonweightbearing.  Will follow-up podiatry and ID recommendations.  Blood cultures from 5/ 3/25 showing Streptococcus constellatus repeat blood cultures from 01/29/24 negative in 24 hours.  Wound culture with Enterococcus and Streptococcus.  Temperature max of 99.8 F with leukocytosis at 19.0  Left calf pain.  Ultrasound of the lower extremity with DVT as per podiatry.  Since no plans for further surgical intervention, will put the patient on Eliquis.  Discussed with podiatry about it.  Nausea decreased appetite.  Improved.  Was able to take orally.  Diabetes mellitus type II. Hemoglobin A1c more than 9.  Continue sliding scale insulin.  Diabetic education.  Emphasized compliance on medications.    Essential hypertension: Blood pressure seems to be stable.  Not on medications at home.   Hypercalcemia: Latest calcium of 8.9.  Improved.   Lactic acidosis: Secondary to sepsis.  Improved.   Class I obesity. Body mass index is 32.84 kg/m.  Would benefit from lifestyle modification and weight loss as outpatient.  DVT prophylaxis: SCDs Start: 01/27/24 1954, hold Lovenox for next few days for possible need for repeat surgery.   Disposition: Home with home health likely in few days  Status is: Inpatient  Remains inpatient appropriate because: Amputation and debridement, status post repeat debridement with wound VAC, IV antibiotic, pending clinical improvement  Code Status:     Code Status: Full Code  Family Communication: Spoke with the patient's wife at bedside 01/30/2024   Consultants: Podiatry Infectious disease.  Procedures: First ray amputation of the left foot with second toe amputation at MPJ level on 01/27/2024 Repeat irrigation and debridement of partial second metatarsal resection of  the left foot, normal allograft application and wound VAC treatment on 01/29/2024  Anti-infectives:  Linezolid and Unasyn  Anti-infectives (From admission, onward)    Start     Dose/Rate Route  Frequency Ordered Stop   01/30/24 1200  Ampicillin-Sulbactam (UNASYN) 3 g in sodium chloride  0.9 % 100 mL IVPB        3 g 200 mL/hr over 30 Minutes Intravenous Every 6 hours 01/30/24 1103     01/29/24 2200  linezolid (ZYVOX) tablet 600 mg        600 mg Oral Every 12 hours 01/29/24 1040     01/29/24 1130  cefTRIAXone (ROCEPHIN) 2 g in sodium chloride  0.9 % 100 mL IVPB  Status:  Discontinued        2 g 200 mL/hr over 30 Minutes Intravenous Every 24 hours 01/29/24 1036 01/30/24 1103   01/28/24 1130  piperacillin-tazobactam (ZOSYN) IVPB 3.375 g  Status:  Discontinued        3.375 g 12.5 mL/hr over 240 Minutes Intravenous Every 8 hours 01/28/24 1035 01/29/24 1036   01/28/24 1000  metroNIDAZOLE (FLAGYL) IVPB 500 mg  Status:  Discontinued        500 mg 100 mL/hr over 60 Minutes Intravenous Every 12 hours 01/28/24 0925 01/28/24 1035   01/27/24 2200  linezolid (ZYVOX) IVPB 600 mg  Status:  Discontinued        600 mg 300 mL/hr over 60 Minutes Intravenous Every 12 hours 01/27/24 1953 01/27/24 2007   01/27/24 1835  vancomycin (VANCOCIN) powder  Status:  Discontinued          As needed 01/27/24 1835 01/27/24 1843   01/27/24 1834  tobramycin (NEBCIN) injection  Status:  Discontinued          As needed 01/27/24 1835 01/27/24 1843   01/27/24 1515  linezolid (ZYVOX) IVPB 600 mg  Status:  Discontinued        600 mg 300 mL/hr over 60 Minutes Intravenous Every 12 hours 01/27/24 1502 01/29/24 1036   01/27/24 1515  piperacillin-tazobactam (ZOSYN) IVPB 3.375 g        3.375 g 100 mL/hr over 30 Minutes Intravenous  Once 01/27/24 1505 01/27/24 1635   01/27/24 1430  vancomycin (VANCOCIN) IVPB 1000 mg/200 mL premix  Status:  Discontinued       Placed in "Followed by" Linked Group   1,000 mg 200 mL/hr over 60 Minutes Intravenous  Once 01/27/24 1419 01/27/24 1502   01/27/24 1430  vancomycin (VANCOCIN) IVPB 1000 mg/200 mL premix  Status:  Discontinued       Placed in "Followed by" Linked Group   1,000 mg 200  mL/hr over 60 Minutes Intravenous  Once 01/27/24 1419 01/27/24 1502   01/27/24 1415  Ampicillin-Sulbactam (UNASYN) 3 g in sodium chloride  0.9 % 100 mL IVPB  Status:  Discontinued       Placed in "And" Linked Group   3 g 200 mL/hr over 30 Minutes Intravenous  Once 01/27/24 1412 01/27/24 1635   01/27/24 1415  vancomycin (VANCOCIN) IVPB 1000 mg/200 mL premix  Status:  Discontinued       Placed in "And" Linked Group   1,000 mg 200 mL/hr over 60 Minutes Intravenous  Once 01/27/24 1412 01/27/24 1419       Subjective: Today, patient was seen and examined at bedside.  Patient denies overt pain.  Feels constipated, no nausea vomiting fevers chills or rigor.  Denies any dyspnea, shortness of breath or chest pain.  Objective: Vitals:  01/30/24 0421 01/30/24 0725  BP: 139/81 (!) 144/86  Pulse:  98  Resp: 18 18  Temp: 97.7 F (36.5 C) 99.1 F (37.3 C)  SpO2: 94% 97%    Intake/Output Summary (Last 24 hours) at 01/30/2024 1111 Last data filed at 01/30/2024 0900 Gross per 24 hour  Intake 1060 ml  Output 3505 ml  Net -2445 ml   Filed Weights   01/29/24 0408 01/29/24 1113 01/30/24 0421  Weight: 111 kg 106.6 kg 112.9 kg   Body mass index is 32.84 kg/m.   Physical Exam:  GENERAL: Patient is alert awake and oriented. Not in obvious distress.  Obese built HENT: No scleral pallor or icterus. Pupils equally reactive to light. Oral mucosa is moist NECK: is supple, no gross swelling noted. CHEST: Clear to auscultation. No crackles or wheezes.  Diminished breath sounds bilaterally. CVS: S1 and S2 heard, no murmur. Regular rate and rhythm.  ABDOMEN: Soft, non-tender, bowel sounds are present. EXTREMITIES: Left foot with dressing, wound VAC in place, CNS: Cranial nerves are intact. No focal motor deficits. SKIN: warm and dry, left foot wound with dressing Data Review: I have personally reviewed the following laboratory data and studies,  CBC: Recent Labs  Lab 01/27/24 1450 01/28/24 0351  01/29/24 0252 01/30/24 0357  WBC 17.6* 18.4* 18.5* 19.0*  NEUTROABS 16.4*  --   --   --   HGB 14.3 12.2* 12.3* 12.6*  HCT 42.2 36.6* 37.6* 38.0*  MCV 87.9 88.6 88.7 88.4  PLT 468* 387 399 405*   Basic Metabolic Panel: Recent Labs  Lab 01/27/24 1450 01/28/24 0351 01/29/24 0252 01/30/24 0357  NA 136 137 133* 134*  K 3.9 3.9 4.0 4.1  CL 93* 102 99 99  CO2 19* 17* 21* 20*  GLUCOSE 193* 137* 174* 204*  BUN 12 9 9 7   CREATININE 0.98 1.12 0.91 0.89  CALCIUM 10.4* 8.9 9.1 9.1  MG  --   --  1.8  --   PHOS  --   --   --  3.3   Liver Function Tests: Recent Labs  Lab 01/27/24 1450 01/28/24 0351  AST 34 25  ALT 18 23  ALKPHOS 191* 94  BILITOT 1.0 2.3*  PROT 8.0 6.8  ALBUMIN 3.8 2.2*   No results for input(s): "LIPASE", "AMYLASE" in the last 168 hours. No results for input(s): "AMMONIA" in the last 168 hours. Cardiac Enzymes: No results for input(s): "CKTOTAL", "CKMB", "CKMBINDEX", "TROPONINI" in the last 168 hours. BNP (last 3 results) No results for input(s): "BNP" in the last 8760 hours.  ProBNP (last 3 results) No results for input(s): "PROBNP" in the last 8760 hours.  CBG: Recent Labs  Lab 01/29/24 1243 01/29/24 1428 01/29/24 1632 01/29/24 2044 01/30/24 0551  GLUCAP 128* 115* 121* 189* 220*   Recent Results (from the past 240 hours)  Blood Cultures x 2 sites     Status: None (Preliminary result)   Collection Time: 01/27/24  2:55 PM   Specimen: BLOOD LEFT FOREARM  Result Value Ref Range Status   Specimen Description BLOOD LEFT FOREARM  Final   Special Requests   Final    Blood Culture adequate volume BOTTLES DRAWN AEROBIC AND ANAEROBIC   Culture  Setup Time   Final    GRAM POSITIVE COCCI IN BOTH AEROBIC AND ANAEROBIC BOTTLES CRITICAL VALUE NOTED.  VALUE IS CONSISTENT WITH PREVIOUSLY REPORTED AND CALLED VALUE. Performed at [facility], [address], [place], [place] [address]    Culture Hillis Lu  POSITIVE COCCI  Final   Report Status PENDING   Incomplete  Blood Cultures x 2 sites     Status: Abnormal (Preliminary result)   Collection Time: 01/27/24  2:56 PM   Specimen: BLOOD  Result Value Ref Range Status   Specimen Description   Final    BLOOD RIGHT ANTECUBITAL Performed at [facility], [address], [place], [place] [address]    Special Requests   Final    Blood Culture adequate volume BOTTLES DRAWN AEROBIC AND ANAEROBIC   Culture  Setup Time   Final    GRAM POSITIVE COCCI IN CHAINS IN BOTH AEROBIC AND ANAEROBIC BOTTLES CRITICAL RESULT CALLED TO, READ BACK BY AND VERIFIED WITH: PHARMD Normie Becton [id number] AT 1956, ADC Performed at [facility], [address], [place], [place] [address]    Culture STREPTOCOCCUS CONSTELLATUS (A)  Final   Report Status PENDING  Incomplete  Blood Culture ID Panel (Reflexed)     Status: Abnormal   Collection Time: 01/27/24  2:56 PM  Result Value Ref Range Status   Enterococcus faecalis NOT DETECTED NOT DETECTED Final   Enterococcus Faecium NOT DETECTED NOT DETECTED Final   Listeria monocytogenes NOT DETECTED NOT DETECTED Final   Staphylococcus species NOT DETECTED NOT DETECTED Final   Staphylococcus aureus (BCID) NOT DETECTED NOT DETECTED Final   Staphylococcus epidermidis NOT DETECTED NOT DETECTED Final   Staphylococcus lugdunensis NOT DETECTED NOT DETECTED Final   Streptococcus species DETECTED (A) NOT DETECTED Final    Comment: Not Enterococcus species, Streptococcus agalactiae, Streptococcus pyogenes, or Streptococcus pneumoniae. CRITICAL RESULT CALLED TO, READ BACK BY AND VERIFIED WITH: PHARMD TONY R. [id number] AT 1956, ADC    Streptococcus agalactiae NOT DETECTED NOT DETECTED Final   Streptococcus pneumoniae NOT DETECTED NOT DETECTED Final   Streptococcus pyogenes NOT DETECTED NOT DETECTED Final   A.calcoaceticus-baumannii NOT DETECTED NOT DETECTED Final   Bacteroides fragilis NOT DETECTED NOT DETECTED Final   Enterobacterales NOT DETECTED NOT DETECTED Final    Enterobacter cloacae complex NOT DETECTED NOT DETECTED Final   Escherichia coli NOT DETECTED NOT DETECTED Final   Klebsiella aerogenes NOT DETECTED NOT DETECTED Final   Klebsiella oxytoca NOT DETECTED NOT DETECTED Final   Klebsiella pneumoniae NOT DETECTED NOT DETECTED Final   Proteus species NOT DETECTED NOT DETECTED Final   Salmonella species NOT DETECTED NOT DETECTED Final   Serratia marcescens NOT DETECTED NOT DETECTED Final   Haemophilus influenzae NOT DETECTED NOT DETECTED Final   Neisseria meningitidis NOT DETECTED NOT DETECTED Final   Pseudomonas aeruginosa NOT DETECTED NOT DETECTED Final   Stenotrophomonas maltophilia NOT DETECTED NOT DETECTED Final   Candida albicans NOT DETECTED NOT DETECTED Final   Candida auris NOT DETECTED NOT DETECTED Final   Candida glabrata NOT DETECTED NOT DETECTED Final   Candida krusei NOT DETECTED NOT DETECTED Final   Candida parapsilosis NOT DETECTED NOT DETECTED Final   Candida tropicalis NOT DETECTED NOT DETECTED Final   Cryptococcus neoformans/gattii NOT DETECTED NOT DETECTED Final    Comment: Performed at [facility], [address] [place], [place] [address]  Aerobic/Anaerobic Culture w Gram Stain (surgical/deep wound)     Status: None (Preliminary result)   Collection Time: 01/27/24  6:14 PM   Specimen: PATH Soft tissue resection  Result Value Ref Range Status   Specimen Description TISSUE  Final   Special Requests LEFT, 1ST RAY AMP  Final   Gram Stain   Final    NO WBC SEEN ABUNDANT GRAM  POSITIVE COCCI MODERATE GRAM NEGATIVE RODS RARE GRAM POSITIVE RODS    Culture   Final    MODERATE ENTEROCOCCUS FAECALIS SUSCEPTIBILITIES TO FOLLOW MODERATE STREPTOCOCCUS GROUP F Beta hemolytic streptococci are predictably susceptible to penicillin and other beta lactams. Susceptibility testing not routinely performed. FEW PREVOTELLA SPECIES BETA LACTAMASE POSITIVE Performed at [facility], [address] [place], [place]  [address]    Report Status PENDING  Incomplete  Aerobic/Anaerobic Culture w Gram Stain (surgical/deep wound)     Status: None (Preliminary result)   Collection Time: 01/27/24  6:15 PM   Specimen: PATH Bone resection; Tissue  Result Value Ref Range Status   Specimen Description BONE  Final   Special Requests LEFT, 1ST RAY BONE  Final   Gram Stain   Final    NO WBC SEEN MODERATE GRAM POSITIVE COCCI MODERATE GRAM NEGATIVE RODS FEW GRAM POSITIVE RODS    Culture   Final    MODERATE ENTEROCOCCUS FAECALIS MODERATE STREPTOCOCCUS GROUP F Beta hemolytic streptococci are predictably susceptible to penicillin and other beta lactams. Susceptibility testing not routinely performed. FEW PREVOTELLA SPECIES BETA LACTAMASE POSITIVE Performed at [facility], [address], [place], [place] [address]    Report Status PENDING  Incomplete  Culture, blood (Routine X 2) w Reflex to ID Panel     Status: None (Preliminary result)   Collection Time: 01/29/24 10:55 AM   Specimen: BLOOD LEFT ARM  Result Value Ref Range Status   Specimen Description BLOOD LEFT ARM  Final   Special Requests   Final    BOTTLES DRAWN AEROBIC AND ANAEROBIC Blood Culture results may not be optimal due to an inadequate volume of blood received in culture bottles   Culture   Final    NO GROWTH < 24 HOURS Performed at [facility], [address], [place], [place] [address]    Report Status PENDING  Incomplete  Culture, blood (Routine X 2) w Reflex to ID Panel     Status: None (Preliminary result)   Collection Time: 01/29/24 10:55 AM   Specimen: BLOOD LEFT ARM  Result Value Ref Range Status   Specimen Description BLOOD LEFT ARM  Final   Special Requests   Final    BOTTLES DRAWN AEROBIC AND ANAEROBIC Blood Culture results may not be optimal due to an inadequate volume of blood received in culture bottles   Culture   Final    NO GROWTH < 24 HOURS Performed at [facility], [address], [place], [place]  [address]    Report Status PENDING  Incomplete     Studies: DG Foot 2 Views Left Result Date: 01/29/2024 CLINICAL DATA:  Osteomyelitis left foot.  Postoperative evaluation. EXAM: LEFT FOOT - 2 VIEW COMPARISON:  Left foot radiographs 01/27/2024, CT left foot 01/27/2024 FINDINGS: Recent amputation of the first ray to the metatarsal midshaft and of the second toe phalanges seen on 01/27/2024 radiographs. On the current radiographs there is repeat amputation of the first ray now to the proximal metatarsal shaft and repeat amputation of the second ray now to the proximal to mid metatarsal shaft. Expected postoperative changes of distal medial foot subcutaneous air and surgical skin staples. Interval removal of the prior antibiotic beads except for a likely single antibiotic bead in the location of the now resected distal aspect of the second metatarsal. Small plantar calcaneal heel spur. Mild dorsal talonavicular degenerative spurring. No acute fracture dislocation. IMPRESSION: Repeat amputation of the first ray now  to the proximal metatarsal shaft and repeat amputation of the second ray now to the proximal to mid metatarsal shaft. The amputation margins are sharp. Electronically Signed   By: Bertina Broccoli M.D.   On: 01/29/2024 14:42      Rosena Conradi, MD  [facility] 01/30/2024  If 7PM-7AM, please contact night-coverage

## 2024-01-30 NOTE — TOC Initial Note (Signed)
 Transition of Care [facility]) - Initial/Assessment Note    Patient Details  Name: Dakota Becker MRN: [id number] Date of Birth: 07/07/1976  [facility]) CM/SW Contact:    Arron Big, LCSWA Phone Number: 01/30/2024, 4:19 PM  Clinical Narrative:   CSW introduced self/role at bedside. CSW met patient at bedside to discuss PT recs for CIR and backup plan for SNF/HH. Patient is understanding of all three options and agreeable to SNF workup at this time. Patient would like to be screened for CIR as well to determine candidacy.   TOC will continue to follow.        Expected Discharge Plan:  (TBD) Barriers to Discharge: Continued Medical Work up, Other (must enter comment) (Clarity on dispostion)   Patient Goals and CMS Choice Patient states their goals for this hospitalization and ongoing recovery are:: To get better          Expected Discharge Plan and Services In-house Referral: Clinical Social Work     Living arrangements for the past 2 months: Single Family Home                                      Prior Living Arrangements/Services Living arrangements for the past 2 months: Single Family Home Lives with:: Spouse Patient language and need for interpreter reviewed:: No Do you feel safe going back to the place where you live?: Yes      Need for Family Participation in Patient Care: Yes (Comment) Care giver support system in place?: Yes (comment)   Criminal Activity/Legal Involvement Pertinent to Current Situation/Hospitalization: No - Comment as needed  Activities of Daily Living   ADL Screening (condition at time of admission) Independently performs ADLs?: Yes (appropriate for developmental age) Is the patient deaf or have difficulty hearing?: No Does the patient have difficulty seeing, even when wearing glasses/contacts?: No Does the patient have difficulty concentrating, remembering, or making decisions?: No  Permission  Sought/Granted Permission sought to share information with : Family Supports, Oceanographer granted to share information with : Yes, Verbal Permission Granted  Share Information with NAME: Layman Pries  Permission granted to share info w AGENCY: SNFs  Permission granted to share info w Relationship: Spouse  Permission granted to share info w Contact Information: [phone], [phone]  Emotional Assessment Appearance:: Appears stated age Attitude/Demeanor/Rapport: Engaged Affect (typically observed): Appropriate, Pleasant Orientation: : Oriented to Situation, Oriented to  Time, Oriented to Self, Oriented to Place Alcohol / Substance Use: Not Applicable Psych Involvement: No (comment)  Admission diagnosis:  Toe osteomyelitis, left (HCC) [M86.9] Gangrene of toe of left foot (HCC) [I96] Patient Active Problem List   Diagnosis Date Noted   Toe osteomyelitis, left (HCC) 01/27/2024   Gas gangrene of foot (HCC) 01/27/2024   Gangrene of toe of left foot (HCC) 01/27/2024   Controlled type 2 diabetes mellitus without complication, without long-term current use of insulin (HCC) 01/27/2024   Hypercalcemia 01/27/2024   PCP:  Alejandro Hurt, FNP Pharmacy:   [id number] Jonette Nestle, [place] - [address] AT [facility] OF [facility] & [address] [place] [address] Phone: [phone] Fax: [phone]  [facility] #[id number] - [address],  - [address] AT [facility] OF [address] & [facility] [address] [place] [address] Phone: [phone] Fax: [phone]  [facility] [address] [place] [address] Phone: [phone] Fax: [phone]     Social Drivers of Health (SDOH) Social History: SDOH Screenings   Food Insecurity: No Food Insecurity (01/27/2024)  Housing: Low Risk  (01/27/2024)  Transportation Needs: No Transportation Needs (01/27/2024)  Utilities:  Not At Risk (01/27/2024)  Social Connections: Socially Integrated (01/27/2024)  Tobacco Use: Low Risk  (01/29/2024)   SDOH Interventions:     Readmission Risk Interventions     No data to display

## 2024-01-30 NOTE — [place] FL2 (Signed)
 [place]  MEDICAID FL2 LEVEL OF CARE FORM     IDENTIFICATION  Patient Name: Dakota Becker Birthdate: July 25, 1976 Sex: male Admission Date (Current Location): 01/27/2024  [facility] and [place] Number:  Producer, television/film/video and Address:  [facility] [place], [address], [place], [place] [address]      Provider Number: [id number]  Attending Physician Name and Address:  Rosena Conradi, MD  Relative Name and Phone Number:       Current Level of Care: Hospital Recommended Level of Care: Skilled Nursing Facility Prior Approval Number:    Date Approved/Denied:   PASRR Number: [id number]  Discharge Plan: SNF    Current Diagnoses: Patient Active Problem List   Diagnosis Date Noted   Toe osteomyelitis, left (HCC) 01/27/2024   Gas gangrene of foot (HCC) 01/27/2024   Gangrene of toe of left foot (HCC) 01/27/2024   Controlled type 2 diabetes mellitus without complication, without long-term current use of insulin (HCC) 01/27/2024   Hypercalcemia 01/27/2024    Orientation RESPIRATION BLADDER Height & Weight     Self, Time, Situation, Place  Normal Continent Weight: 248 lb 14.4 oz (112.9 kg) Height:  6\' 1"  (185.4 cm)  BEHAVIORAL SYMPTOMS/MOOD NEUROLOGICAL BOWEL NUTRITION STATUS      Continent Diet (see dc summary)  AMBULATORY STATUS COMMUNICATION OF NEEDS Skin   Extensive Assist Verbally Other (Comment), Wound Vac (2 Incision-Foot Left; Wound / Incision - Burn Pretibial Right;Lateral; Wound / Incision - Non-pressure wound Toe  Anterior;Right great toe nail removal; Negative Pressure Wound Therapy Toe (Comment which one) Anterior;Left)                       Personal Care Assistance Level of Assistance  Bathing, Feeding, Dressing Bathing Assistance:  (see dc summary) Feeding assistance:  (see dc summary) Dressing Assistance:  (see dc summary)     Functional Limitations Info  Sight, Hearing, Speech Sight Info: Adequate Hearing Info: Adequate Speech  Info: Adequate    SPECIAL CARE FACTORS FREQUENCY  PT (By licensed PT), OT (By licensed OT)     PT Frequency: 5x week OT Frequency: 5x week            Contractures Contractures Info: Not present    Additional Factors Info  Code Status, Allergies, Insulin Sliding Scale Code Status Info: Full Allergies Info: NKA   Insulin Sliding Scale Info: see dc summary       Current Medications (01/30/2024):  This is the current hospital active medication list Current Facility-Administered Medications  Medication Dose Route Frequency Provider Last Rate Last Admin   acetaminophen  (TYLENOL ) suppository 650 mg  650 mg Rectal Once Garba, Mohammad L, MD       acetaminophen  (TYLENOL ) tablet 650 mg  650 mg Oral Q6H PRN Garba, Mohammad L, MD   650 mg at 01/28/24 1409   Or   acetaminophen  (TYLENOL ) suppository 650 mg  650 mg Rectal Q6H PRN Garba, Mohammad L, MD       Ampicillin-Sulbactam (UNASYN) 3 g in sodium chloride  0.9 % 100 mL IVPB  3 g Intravenous Q6H [name], MD 200 mL/hr at 01/30/24 1619 3 g at 01/30/24 1619   apixaban (ELIQUIS) tablet 10 mg  10 mg Oral BID Pokhrel, Laxman, MD   10 mg at 01/30/24 1513   Followed by   Cecily Cohen ON 02/06/2024] apixaban (ELIQUIS) tablet 5 mg  5 mg Oral BID Pokhrel, Laxman, MD       insulin aspart (novoLOG) injection  0-15 Units  0-15 Units Subcutaneous TID WC Davida Espy, MD   3 Units at 01/30/24 1545   insulin aspart (novoLOG) injection 0-5 Units  0-5 Units Subcutaneous QHS Garba, Mohammad L, MD       linezolid (ZYVOX) tablet 600 mg  600 mg Oral Q12H [name], MD   600 mg at 01/30/24 1020   mupirocin ointment (BACTROBAN) 2 %   Topical Daily Evertt Hoe, DPM   Given at 01/30/24 1509   Oral care mouth rinse  15 mL Mouth Rinse PRN Akula, Vijaya, MD       oxyCODONE -acetaminophen  (PERCOCET/ROXICET) 5-325 MG per tablet 1-2 tablet  1-2 tablet Oral Q6H PRN Pokhrel, Laxman, MD   1 tablet at 01/30/24 1055   pneumococcal 20-valent conjugate  vaccine (PREVNAR 20) injection 0.5 mL  0.5 mL Intramuscular Tomorrow-1000 Akula, Vijaya, MD       prochlorperazine  (COMPAZINE ) injection 5 mg  5 mg Intravenous Q6H PRN Hall, Carole N, DO   5 mg at 01/28/24 0132     Discharge Medications: Please see discharge summary for a list of discharge medications.  Relevant Imaging Results:  Relevant Lab Results:   Additional Information SSN [id number] [place], LCSWA

## 2024-01-30 NOTE — Progress Notes (Signed)
 LLE venous and arterial duplexes have been completed.  Preliminary results given to Dr. Efrain Grant and Dr. Rosemarie Conquest.   Results can be found under chart review under CV PROC. 01/30/2024 1:40 PM Koleen Celia RVT, RDMS

## 2024-01-30 NOTE — Progress Notes (Signed)
 Wound vac keeps beeping error. No signs of leakage from dressing or broken seal. Restarted twice with same error after a while. New Vac requested from portable equipment. Working well so far. Will continue to monitor.

## 2024-01-30 NOTE — Progress Notes (Signed)
 OT Cancellation Note  Patient Details Name: MACKAY TEUFEL MRN: [id number] DOB: 1976/08/18   Cancelled Treatment:    Reason Eval/Treat Not Completed: Patient declined, no reason specified (Patient reporting that he has just gotten back into bed post working with PT.  Too tired to complete functional mobility at this time.  OT will re-attempt as able)  Lacretia Piccolo 01/30/2024, 4:40 PM

## 2024-01-31 ENCOUNTER — Telehealth (HOSPITAL_COMMUNITY): Payer: Self-pay | Admitting: Pharmacy Technician

## 2024-01-31 ENCOUNTER — Other Ambulatory Visit (HOSPITAL_COMMUNITY): Payer: Self-pay

## 2024-01-31 ENCOUNTER — Inpatient Hospital Stay (HOSPITAL_COMMUNITY)

## 2024-01-31 DIAGNOSIS — A480 Gas gangrene: Secondary | ICD-10-CM | POA: Diagnosis not present

## 2024-01-31 DIAGNOSIS — E119 Type 2 diabetes mellitus without complications: Secondary | ICD-10-CM | POA: Diagnosis not present

## 2024-01-31 DIAGNOSIS — K5900 Constipation, unspecified: Secondary | ICD-10-CM

## 2024-01-31 DIAGNOSIS — R7881 Bacteremia: Secondary | ICD-10-CM

## 2024-01-31 DIAGNOSIS — I82402 Acute embolism and thrombosis of unspecified deep veins of left lower extremity: Secondary | ICD-10-CM

## 2024-01-31 DIAGNOSIS — I10 Essential (primary) hypertension: Secondary | ICD-10-CM

## 2024-01-31 DIAGNOSIS — M869 Osteomyelitis, unspecified: Secondary | ICD-10-CM | POA: Diagnosis not present

## 2024-01-31 LAB — CBC
HCT: 36.4 % — ABNORMAL LOW (ref 39.0–52.0)
Hemoglobin: 11.9 g/dL — ABNORMAL LOW (ref 13.0–17.0)
MCH: 28.9 pg (ref 26.0–34.0)
MCHC: 32.7 g/dL (ref 30.0–36.0)
MCV: 88.3 fL (ref 80.0–100.0)
Platelets: 423 10*3/uL — ABNORMAL HIGH (ref 150–400)
RBC: 4.12 MIL/uL — ABNORMAL LOW (ref 4.22–5.81)
RDW: 13.3 % (ref 11.5–15.5)
WBC: 15.9 10*3/uL — ABNORMAL HIGH (ref 4.0–10.5)
nRBC: 0 % (ref 0.0–0.2)

## 2024-01-31 LAB — GLUCOSE, CAPILLARY
Glucose-Capillary: 202 mg/dL — ABNORMAL HIGH (ref 70–99)
Glucose-Capillary: 217 mg/dL — ABNORMAL HIGH (ref 70–99)
Glucose-Capillary: 235 mg/dL — ABNORMAL HIGH (ref 70–99)

## 2024-01-31 LAB — BASIC METABOLIC PANEL WITH GFR
Anion gap: 14 (ref 5–15)
BUN: 9 mg/dL (ref 6–20)
CO2: 23 mmol/L (ref 22–32)
Calcium: 9.1 mg/dL (ref 8.9–10.3)
Chloride: 98 mmol/L (ref 98–111)
Creatinine, Ser: 0.84 mg/dL (ref 0.61–1.24)
GFR, Estimated: >60 mL/min (ref >60)
Glucose, Bld: 206 mg/dL — ABNORMAL HIGH (ref 70–99)
Potassium: 4.1 mmol/L (ref 3.5–5.1)
Sodium: 135 mmol/L (ref 135–145)

## 2024-01-31 LAB — MAGNESIUM: Magnesium: 2 mg/dL (ref 1.7–2.4)

## 2024-01-31 LAB — ECHOCARDIOGRAM COMPLETE
Area-P 1/2: 3.77 cm2
S' Lateral: 2.8 cm

## 2024-01-31 MED ORDER — SENNOSIDES-DOCUSATE SODIUM 8.6-50 MG PO TABS
1.0000 | ORAL_TABLET | Freq: Two times a day (BID) | ORAL | Status: DC
  Filled 2024-01-31 (×3): qty 1

## 2024-01-31 MED ORDER — POLYETHYLENE GLYCOL 3350 17 G PO PACK
17.0000 g | PACK | Freq: Every day | ORAL | Status: DC | PRN
  Administered 2024-02-04: 17 g via ORAL
  Filled 2024-01-31: qty 1

## 2024-01-31 MED ORDER — SENNOSIDES-DOCUSATE SODIUM 8.6-50 MG PO TABS
2.0000 | ORAL_TABLET | ORAL | Status: AC
  Administered 2024-01-31: 2 via ORAL
  Filled 2024-01-31: qty 2

## 2024-01-31 MED ORDER — BISACODYL 10 MG RE SUPP
10.0000 mg | Freq: Once | RECTAL | Status: AC
  Administered 2024-01-31: 10 mg via RECTAL
  Filled 2024-01-31: qty 1

## 2024-01-31 MED ORDER — SORBITOL 70 % SOLN
30.0000 mL | Status: AC
  Administered 2024-01-31: 30 mL via ORAL
  Filled 2024-01-31: qty 30

## 2024-01-31 MED ORDER — POLYETHYLENE GLYCOL 3350 17 G PO PACK
17.0000 g | PACK | Freq: Two times a day (BID) | ORAL | Status: DC
  Filled 2024-01-31 (×3): qty 1

## 2024-01-31 MED ORDER — GABAPENTIN 300 MG PO CAPS
300.0000 mg | ORAL_CAPSULE | Freq: Three times a day (TID) | ORAL | Status: AC
Start: 2024-01-31 — End: ?
  Administered 2024-01-31 – 2024-02-08 (×22): 300 mg via ORAL
  Filled 2024-01-31 (×23): qty 1

## 2024-01-31 MED ORDER — LISINOPRIL 20 MG PO TABS
20.0000 mg | ORAL_TABLET | Freq: Every day | ORAL | Status: DC
  Administered 2024-01-31 – 2024-02-08 (×9): 20 mg via ORAL
  Filled 2024-01-31 (×9): qty 1

## 2024-01-31 NOTE — Plan of Care (Signed)
  Problem: Education: Goal: Knowledge of General Education information will improve Description: Including pain rating scale, medication(s)/side effects and non-pharmacologic comfort measures Outcome: Progressing   Problem: Health Behavior/Discharge Planning: Goal: Ability to manage health-related needs will improve Outcome: Progressing   Problem: Clinical Measurements: Goal: Ability to maintain clinical measurements within normal limits will improve Outcome: Progressing   Problem: Activity: Goal: Risk for activity intolerance will decrease Outcome: Progressing   Problem: Nutrition: Goal: Adequate nutrition will be maintained Outcome: Progressing   Problem: Coping: Goal: Level of anxiety will decrease Outcome: Progressing   Problem: Elimination: Goal: Will not experience complications related to bowel motility Outcome: Progressing   Problem: Pain Managment: Goal: General experience of comfort will improve and/or be controlled Outcome: Progressing   Problem: Skin Integrity: Goal: Risk for impaired skin integrity will decrease Outcome: Progressing

## 2024-01-31 NOTE — Progress Notes (Signed)
 PROGRESS NOTE    Dakota Becker  ZOX:[id number] DOB: [birth year] DOA: 01/27/2024 PCP: Alejandro Hurt, FNP    Chief Complaint  Patient presents with   Foot Pain   Wound Infection   Code Sepsis    Brief Narrative:   Dakota Becker is a 48 y.o. male with medical history significant of diabetes, essential hypertension, hyperlipidemia, with great toe ulcer developed some blisters.  Not taking his diabetic medication.  He went to PCP and was put on oral antibiotics and referred to podiatry.  Recommended wound management but he never got contacted so started having increasing swelling in the left toe with pain for 5 days or so with some chills and nausea ,vomiting and foul odor.  Patient then presented to [facility].  Upon arrival to the ED podiatry was consulted.  Patient  met sepsis criteria secondary to osteomyelitis of the left great toe.  Patient was then taken to the OR and underwent left hallux amputation.  Patient underwent repeat debridement on 01/29/2024 by podiatry and ID  has been consulted for antibiotic management..    Assessment & Plan:   Principal Problem:   Toe osteomyelitis, left (HCC) Active Problems:   Gas gangrene of foot (HCC)   Gangrene of toe of left foot (HCC)   Controlled type 2 diabetes mellitus without complication, without long-term current use of insulin (HCC)   Hypercalcemia   Constipation   Hypertension   Acute deep vein thrombosis (DVT) of left lower extremity (HCC)   Type 2 diabetes mellitus without complication, without long-term current use of insulin (HCC)  #1 sepsis secondary to gas gangrene of the left great toe with osteomyelitis/Streptococcus bacteremia -Patient seen in consultation by podiatry and underwent surgical intervention on admission with partial first ray amputation of the left foot and second toe amputation at MPJ level of the left foot with application of dissolvable antibiotic beads of the left foot. - Patient followed by  podiatry underwent repeat irrigation and debridement with allograft application with wound VAC treatment on 01/29/2024. - Per podiatry patient high risk for need for revision including transmetatarsal amputation. - ID consulted and patient currently on linezolid and Unasyn. - NWB per podiatry. - Blood cultures from 01/27/2024 with Streptococcus constellatus with repeat blood cultures from 01/29/2024 with no growth to date. - Wound cultures with Enterococcus and Streptococcus noted. - Patient with a leukocytosis which has started to trend down. - Fever curve has trended down. - 2D echo with a EF of 60 to 65%, NWMA, grade 1 diastolic dysfunction, no vegetations noted. - Continue Unasyn, linezolid. - ID and podiatry following and I appreciate the input and recommendations.  2.  Left lower extremity DVT -Patient started on Eliquis for anticoagulation as no further surgical intervention planned at this time.  3.  Nausea/decreased appetite -Improved.  4.  Constipation -Place on MiraLAX twice daily, Senokot-S twice daily. - Sorbitol p.o. every 3 hours x 2 doses. - Dulcolax suppository x 1.  5.  Diabetes mellitus type 2 -Hemoglobin A1c 9.8. - CBG 202 this morning. - Patient noted to be on glipizide, Jardiance, semaglutide prior to admission. - Continue SSI. - Diabetes coordinator following.  6.  Hypertension -BP currently stable. - Resume home regimen lisinopril 20 mg daily.  7.  Class I obesity -BMI of 32.84 kg/m. -Lifestyle modification - Outpatient follow-up with PCP.  8.  Hypercalcemia -Resolved.   DVT prophylaxis: Eliquis Code Status: Full Family Communication: Updated patient.  No family at bedside. Disposition: SNF  Status is: Inpatient Remains inpatient appropriate because: Severity of illness   Consultants:  Podiatry: Dr. Rosemarie Conquest 01/27/2024 ID: Dr. Zelda Hickman 01/29/2024 Wound care RN  Procedures: 2D echo 01/31/2024 CT left ankle and left foot 01/27/2024 Plain films of the  left foot 01/27/2024, 01/29/2024 Lower extremity Dopplers 01/30/2024 Lower extremity arterial duplex 01/30/2024 Partial first ray amputation left foot/second toe amputation at MPJ level of the left foot/application of dissolvable antibiotic beads left foot per podiatry: Dr. Demetria Finch 01/27/2024 Repeat irrigation and debridement partial second metatarsal resection, left foot/dermal allograft application 5 x 4 cm left foot/application negative pressure wound therapy 5/4 cm, left foot per podiatry: Dr. Rosemarie Conquest 01/29/2024  Antimicrobials: Anti-infectives (From admission, onward)    Start     Dose/Rate Route Frequency Ordered Stop   01/30/24 1200  Ampicillin-Sulbactam (UNASYN) 3 g in sodium chloride  0.9 % 100 mL IVPB        3 g 200 mL/hr over 30 Minutes Intravenous Every 6 hours 01/30/24 1103     01/29/24 2200  linezolid (ZYVOX) tablet 600 mg        600 mg Oral Every 12 hours 01/29/24 1040     01/29/24 1130  cefTRIAXone (ROCEPHIN) 2 g in sodium chloride  0.9 % 100 mL IVPB  Status:  Discontinued        2 g 200 mL/hr over 30 Minutes Intravenous Every 24 hours 01/29/24 1036 01/30/24 1103   01/28/24 1130  piperacillin-tazobactam (ZOSYN) IVPB 3.375 g  Status:  Discontinued        3.375 g 12.5 mL/hr over 240 Minutes Intravenous Every 8 hours 01/28/24 1035 01/29/24 1036   01/28/24 1000  metroNIDAZOLE (FLAGYL) IVPB 500 mg  Status:  Discontinued        500 mg 100 mL/hr over 60 Minutes Intravenous Every 12 hours 01/28/24 0925 01/28/24 1035   01/27/24 2200  linezolid (ZYVOX) IVPB 600 mg  Status:  Discontinued        600 mg 300 mL/hr over 60 Minutes Intravenous Every 12 hours 01/27/24 1953 01/27/24 2007   01/27/24 1835  vancomycin (VANCOCIN) powder  Status:  Discontinued          As needed 01/27/24 1835 01/27/24 1843   01/27/24 1834  tobramycin (NEBCIN) injection  Status:  Discontinued          As needed 01/27/24 1835 01/27/24 1843   01/27/24 1515  linezolid (ZYVOX) IVPB 600 mg  Status:  Discontinued        600  mg 300 mL/hr over 60 Minutes Intravenous Every 12 hours 01/27/24 1502 01/29/24 1036   01/27/24 1515  piperacillin-tazobactam (ZOSYN) IVPB 3.375 g        3.375 g 100 mL/hr over 30 Minutes Intravenous  Once 01/27/24 1505 01/27/24 1635   01/27/24 1430  vancomycin (VANCOCIN) IVPB 1000 mg/200 mL premix  Status:  Discontinued       Placed in "Followed by" Linked Group   1,000 mg 200 mL/hr over 60 Minutes Intravenous  Once 01/27/24 1419 01/27/24 1502   01/27/24 1430  vancomycin (VANCOCIN) IVPB 1000 mg/200 mL premix  Status:  Discontinued       Placed in "Followed by" Linked Group   1,000 mg 200 mL/hr over 60 Minutes Intravenous  Once 01/27/24 1419 01/27/24 1502   01/27/24 1415  Ampicillin-Sulbactam (UNASYN) 3 g in sodium chloride  0.9 % 100 mL IVPB  Status:  Discontinued       Placed in "And" Linked Group   3 g 200 mL/hr over 30 Minutes Intravenous  Once 01/27/24 1412 01/27/24 1635   01/27/24 1415  vancomycin (VANCOCIN) IVPB 1000 mg/200 mL premix  Status:  Discontinued       Placed in "And" Linked Group   1,000 mg 200 mL/hr over 60 Minutes Intravenous  Once 01/27/24 1412 01/27/24 1419         Subjective: Patient laying in bed.  Stated he just had a significant bowel movement.  Denies any chest pain or shortness of breath.  No abdominal pain.  Tolerating current diet.  Objective: Vitals:   01/30/24 1940 01/30/24 2333 01/31/24 0425 01/31/24 0820  BP: (!) 140/80 139/79 (!) 145/84 139/83  Pulse: (!) 102 96 97 96  Resp: 14 14 14 18   Temp: 99.6 F (37.6 C) 99.7 F (37.6 C) 99 F (37.2 C)   TempSrc: Oral Oral Oral   SpO2: 94% 96% 94% 96%  Weight:   104.2 kg   Height:        Intake/Output Summary (Last 24 hours) at 01/31/2024 1902 Last data filed at 01/31/2024 1215 Gross per 24 hour  Intake 620 ml  Output 1700 ml  Net -1080 ml   Filed Weights   01/29/24 1113 01/30/24 0421 01/31/24 0425  Weight: 106.6 kg 112.9 kg 104.2 kg    Examination:  General exam: Appears calm and  comfortable  Respiratory system: Clear to auscultation anterior lung fields.  No wheezes, no crackles, no rhonchi.  Fair air movement.  Speaking in full sentences.Dakota Becker Respiratory effort normal. Cardiovascular system: S1 & S2 heard, RRR. No JVD, murmurs, rubs, gallops or clicks. No pedal edema. Gastrointestinal system: Abdomen is obese, nondistended, soft and nontender. No organomegaly or masses felt. Normal bowel sounds heard. Central nervous system: Alert and oriented. No focal neurological deficits. Extremities: Left foot in postop bandage with wound VAC in place.  Skin: No rashes, lesions or ulcers Psychiatry: Judgement and insight appear normal. Mood & affect appropriate.     Data Reviewed: I have personally reviewed following labs and imaging studies  CBC: Recent Labs  Lab 01/27/24 1450 01/28/24 0351 01/29/24 0252 01/30/24 0357 01/31/24 0241  WBC 17.6* 18.4* 18.5* 19.0* 15.9*  NEUTROABS 16.4*  --   --   --   --   HGB 14.3 12.2* 12.3* 12.6* 11.9*  HCT 42.2 36.6* 37.6* 38.0* 36.4*  MCV 87.9 88.6 88.7 88.4 88.3  PLT 468* 387 399 405* 423*    Basic Metabolic Panel: Recent Labs  Lab 01/27/24 1450 01/28/24 0351 01/29/24 0252 01/30/24 0357 01/31/24 0241  NA 136 137 133* 134* 135  K 3.9 3.9 4.0 4.1 4.1  CL 93* 102 99 99 98  CO2 19* 17* 21* 20* 23  GLUCOSE 193* 137* 174* 204* 206*  BUN 12 9 9 7 9   CREATININE 0.98 1.12 0.91 0.89 0.84  CALCIUM 10.4* 8.9 9.1 9.1 9.1  MG  --   --  1.8  --  2.0  PHOS  --   --   --  3.3  --     GFR: Estimated Creatinine Clearance: 134.8 mL/min (by C-G formula based on SCr of 0.84 mg/dL).  Liver Function Tests: Recent Labs  Lab 01/27/24 1450 01/28/24 0351  AST 34 25  ALT 18 23  ALKPHOS 191* 94  BILITOT 1.0 2.3*  PROT 8.0 6.8  ALBUMIN 3.8 2.2*    CBG: Recent Labs  Lab 01/30/24 1126 01/30/24 1520 01/30/24 2108 01/31/24 0641 01/31/24 1135  GLUCAP 234* 182* 279* 202* 217*     Recent Results (from the past  240 hours)  Blood  Cultures x 2 sites     Status: Abnormal   Collection Time: 01/27/24  2:55 PM   Specimen: BLOOD LEFT FOREARM  Result Value Ref Range Status   Specimen Description BLOOD LEFT FOREARM  Final   Special Requests   Final    Blood Culture adequate volume BOTTLES DRAWN AEROBIC AND ANAEROBIC   Culture  Setup Time   Final    GRAM POSITIVE COCCI IN BOTH AEROBIC AND ANAEROBIC BOTTLES CRITICAL VALUE NOTED.  VALUE IS CONSISTENT WITH PREVIOUSLY REPORTED AND CALLED VALUE.    Culture (A)  Final    STREPTOCOCCUS CONSTELLATUS SUSCEPTIBILITIES PERFORMED ON PREVIOUS CULTURE WITHIN THE LAST 5 DAYS. Performed at [facility], [address], [place], [place] [address]    Report Status 01/30/2024 FINAL  Final  Blood Cultures x 2 sites     Status: Abnormal   Collection Time: 01/27/24  2:56 PM   Specimen: BLOOD  Result Value Ref Range Status   Specimen Description   Final    BLOOD RIGHT ANTECUBITAL Performed at [facility], [address] [place], [place] [address]    Special Requests   Final    Blood Culture adequate volume BOTTLES DRAWN AEROBIC AND ANAEROBIC   Culture  Setup Time   Final    GRAM POSITIVE COCCI IN CHAINS IN BOTH AEROBIC AND ANAEROBIC BOTTLES CRITICAL RESULT CALLED TO, READ BACK BY AND VERIFIED WITH: PHARMD Normie Becton [id number] AT 1956, ADC Performed at [facility], [address], [place], [place] [address]    Culture STREPTOCOCCUS CONSTELLATUS (A)  Final   Report Status 01/30/2024 FINAL  Final   Organism ID, Bacteria STREPTOCOCCUS CONSTELLATUS  Final      Susceptibility   Streptococcus constellatus - MIC*    PENICILLIN <=0.06 SENSITIVE Sensitive     CEFTRIAXONE 0.25 SENSITIVE Sensitive     ERYTHROMYCIN <=0.12 SENSITIVE Sensitive     LEVOFLOXACIN 0.5 SENSITIVE Sensitive     VANCOMYCIN 0.5 SENSITIVE Sensitive     * STREPTOCOCCUS CONSTELLATUS  Blood Culture ID Panel (Reflexed)     Status: Abnormal   Collection Time: 01/27/24  2:56 PM  Result Value Ref  Range Status   Enterococcus faecalis NOT DETECTED NOT DETECTED Final   Enterococcus Faecium NOT DETECTED NOT DETECTED Final   Listeria monocytogenes NOT DETECTED NOT DETECTED Final   Staphylococcus species NOT DETECTED NOT DETECTED Final   Staphylococcus aureus (BCID) NOT DETECTED NOT DETECTED Final   Staphylococcus epidermidis NOT DETECTED NOT DETECTED Final   Staphylococcus lugdunensis NOT DETECTED NOT DETECTED Final   Streptococcus species DETECTED (A) NOT DETECTED Final    Comment: Not Enterococcus species, Streptococcus agalactiae, Streptococcus pyogenes, or Streptococcus pneumoniae. CRITICAL RESULT CALLED TO, READ BACK BY AND VERIFIED WITH: PHARMD TONY R. [id number] AT 1956, ADC    Streptococcus agalactiae NOT DETECTED NOT DETECTED Final   Streptococcus pneumoniae NOT DETECTED NOT DETECTED Final   Streptococcus pyogenes NOT DETECTED NOT DETECTED Final   A.calcoaceticus-baumannii NOT DETECTED NOT DETECTED Final   Bacteroides fragilis NOT DETECTED NOT DETECTED Final   Enterobacterales NOT DETECTED NOT DETECTED Final   Enterobacter cloacae complex NOT DETECTED NOT DETECTED Final   Escherichia coli NOT DETECTED NOT DETECTED Final   Klebsiella aerogenes NOT DETECTED NOT DETECTED Final   Klebsiella oxytoca NOT DETECTED NOT DETECTED Final   Klebsiella pneumoniae NOT DETECTED NOT DETECTED Final   Proteus species NOT DETECTED NOT DETECTED Final   Salmonella species NOT DETECTED NOT DETECTED  Final   Serratia marcescens NOT DETECTED NOT DETECTED Final   Haemophilus influenzae NOT DETECTED NOT DETECTED Final   Neisseria meningitidis NOT DETECTED NOT DETECTED Final   Pseudomonas aeruginosa NOT DETECTED NOT DETECTED Final   Stenotrophomonas maltophilia NOT DETECTED NOT DETECTED Final   Candida albicans NOT DETECTED NOT DETECTED Final   Candida auris NOT DETECTED NOT DETECTED Final   Candida glabrata NOT DETECTED NOT DETECTED Final   Candida krusei NOT DETECTED NOT DETECTED Final   Candida  parapsilosis NOT DETECTED NOT DETECTED Final   Candida tropicalis NOT DETECTED NOT DETECTED Final   Cryptococcus neoformans/gattii NOT DETECTED NOT DETECTED Final    Comment: Performed at [facility], [address] [place], [place] [address]  Aerobic/Anaerobic Culture w Gram Stain (surgical/deep wound)     Status: None   Collection Time: 01/27/24  6:14 PM   Specimen: PATH Soft tissue resection  Result Value Ref Range Status   Specimen Description TISSUE  Final   Special Requests LEFT, 1ST RAY AMP  Final   Gram Stain   Final    NO WBC SEEN ABUNDANT GRAM POSITIVE COCCI MODERATE GRAM NEGATIVE RODS RARE GRAM POSITIVE RODS    Culture   Final    MODERATE ENTEROCOCCUS FAECALIS MODERATE STREPTOCOCCUS GROUP F Beta hemolytic streptococci are predictably susceptible to penicillin and other beta lactams. Susceptibility testing not routinely performed. FEW PREVOTELLA SPECIES BETA LACTAMASE POSITIVE Performed at [facility], [address], [place], [place] [address]    Report Status 01/30/2024 FINAL  Final   Organism ID, Bacteria ENTEROCOCCUS FAECALIS  Final      Susceptibility   Enterococcus faecalis - MIC*    AMPICILLIN <=2 SENSITIVE Sensitive     VANCOMYCIN 1 SENSITIVE Sensitive     GENTAMICIN SYNERGY SENSITIVE Sensitive     * MODERATE ENTEROCOCCUS FAECALIS  Aerobic/Anaerobic Culture w Gram Stain (surgical/deep wound)     Status: None   Collection Time: 01/27/24  6:15 PM   Specimen: PATH Bone resection; Tissue  Result Value Ref Range Status   Specimen Description BONE  Final   Special Requests LEFT, 1ST RAY BONE  Final   Gram Stain   Final    NO WBC SEEN MODERATE GRAM POSITIVE COCCI MODERATE GRAM NEGATIVE RODS FEW GRAM POSITIVE RODS    Culture   Final    MODERATE ENTEROCOCCUS FAECALIS SUSCEPTIBILITIES PERFORMED ON PREVIOUS CULTURE WITHIN THE LAST 5 DAYS. MODERATE STREPTOCOCCUS GROUP F Beta hemolytic streptococci are predictably susceptible to penicillin and other  beta lactams. Susceptibility testing not routinely performed. FEW PREVOTELLA SPECIES BETA LACTAMASE POSITIVE Performed at [facility], [address], [place], [place] [address]    Report Status 01/30/2024 FINAL  Final  Culture, blood (Routine X 2) w Reflex to ID Panel     Status: None (Preliminary result)   Collection Time: 01/29/24 10:55 AM   Specimen: BLOOD LEFT ARM  Result Value Ref Range Status   Specimen Description BLOOD LEFT ARM  Final   Special Requests   Final    BOTTLES DRAWN AEROBIC AND ANAEROBIC Blood Culture results may not be optimal due to an inadequate volume of blood received in culture bottles   Culture   Final    NO GROWTH 2 DAYS Performed at [facility], [address], [place], [place] [address]    Report Status PENDING  Incomplete  Culture, blood (Routine X 2) w Reflex to ID Panel     Status: None (Preliminary  result)   Collection Time: 01/29/24 10:55 AM   Specimen: BLOOD LEFT ARM  Result Value Ref Range Status   Specimen Description BLOOD LEFT ARM  Final   Special Requests   Final    BOTTLES DRAWN AEROBIC AND ANAEROBIC Blood Culture results may not be optimal due to an inadequate volume of blood received in culture bottles   Culture   Final    NO GROWTH 2 DAYS Performed at [facility], [address], [place], [place] [address]    Report Status PENDING  Incomplete         Radiology Studies: ECHOCARDIOGRAM COMPLETE Result Date: 01/31/2024    ECHOCARDIOGRAM REPORT   Patient Name:   Dakota Becker [facility] Date of Exam: 01/31/2024 Medical Rec #:  [id number]       Height:       73.0 in Accession #:    [id number]      Weight:       229.7 lb Date of Birth:  01/21/1976       BSA:          2.282 m Patient Age:    47 years        BP:           139/83 mmHg Patient Gender: M               HR:           105 bpm. Exam Location:  Inpatient Procedure: 2D Echo (Both Spectral and Color Flow Doppler were utilized during            procedure). Indications:     Bacteremia  History:        Patient has no prior history of Echocardiogram examinations.                 Risk Factors:Diabetes.  Sonographer:    Dione Franks RDCS Referring Phys: [id number] [facility] IMPRESSIONS  1. Left ventricular ejection fraction, by estimation, is 60 to 65%. The left ventricle has normal function. The left ventricle has no regional wall motion abnormalities. Left ventricular diastolic parameters are consistent with Grade I diastolic dysfunction (impaired relaxation).  2. Right ventricular systolic function is normal. The right ventricular size is normal.  3. The mitral valve is normal in structure. No evidence of mitral valve regurgitation. No evidence of mitral stenosis.  4. The aortic valve is normal in structure. Aortic valve regurgitation is not visualized. No aortic stenosis is present.  5. The inferior vena cava is normal in size with greater than 50% respiratory variability, suggesting right atrial pressure of 3 mmHg. Conclusion(s)/Recommendation(s): No evidence of valvular vegetations on this transthoracic echocardiogram. Consider a transesophageal echocardiogram to exclude infective endocarditis if clinically indicated. FINDINGS  Left Ventricle: Left ventricular ejection fraction, by estimation, is 60 to 65%. The left ventricle has normal function. The left ventricle has no regional wall motion abnormalities. The left ventricular internal cavity size was normal in size. There is  borderline left ventricular hypertrophy. Left ventricular diastolic parameters are consistent with Grade I diastolic dysfunction (impaired relaxation). Right Ventricle: The right ventricular size is normal. No increase in right ventricular wall thickness. Right ventricular systolic function is normal. Left Atrium: Left atrial size was normal in size. Right Atrium: Right atrial size was normal in size. Pericardium: There is no evidence of pericardial effusion. Mitral Valve: The mitral valve is normal in  structure. No evidence of mitral valve regurgitation. No evidence of mitral valve stenosis. Tricuspid Valve:  The tricuspid valve is normal in structure. Tricuspid valve regurgitation is not demonstrated. No evidence of tricuspid stenosis. Aortic Valve: The aortic valve is normal in structure. Aortic valve regurgitation is not visualized. No aortic stenosis is present. Pulmonic Valve: The pulmonic valve was normal in structure. Pulmonic valve regurgitation is not visualized. No evidence of pulmonic stenosis. Aorta: The aortic root is normal in size and structure. Venous: The inferior vena cava is normal in size with greater than 50% respiratory variability, suggesting right atrial pressure of 3 mmHg. IAS/Shunts: No atrial level shunt detected by color flow Doppler.  LEFT VENTRICLE PLAX 2D LVIDd:         4.80 cm   Diastology LVIDs:         2.80 cm   LV e' medial:    8.92 cm/s LV PW:         1.10 cm   LV E/e' medial:  10.0 LV IVS:        1.10 cm   LV e' lateral:   16.10 cm/s LVOT diam:     2.30 cm   LV E/e' lateral: 5.6 LV SV:         67 LV SV Index:   29 LVOT Area:     4.15 cm  RIGHT VENTRICLE             IVC RV Basal diam:  2.80 cm     IVC diam: 2.00 cm RV S prime:     20.80 cm/s TAPSE (M-mode): 2.4 cm LEFT ATRIUM           Index        RIGHT ATRIUM           Index LA diam:      3.90 cm 1.71 cm/m   RA Area:     12.80 cm LA Vol (A2C): 38.1 ml 16.69 ml/m  RA Volume:   29.40 ml  12.88 ml/m  AORTIC VALVE LVOT Vmax:   105.00 cm/s LVOT Vmean:  70.500 cm/s LVOT VTI:    0.162 m  AORTA Ao Root diam: 3.40 cm Ao Asc diam:  3.10 cm MITRAL VALVE MV Area (PHT): 3.77 cm    SHUNTS MV Decel Time: 201 msec    Systemic VTI:  0.16 m MV E velocity: 89.60 cm/s  Systemic Diam: 2.30 cm MV A velocity: 95.60 cm/s MV E/A ratio:  0.94 Dorothye Gathers MD Electronically signed by Dorothye Gathers MD Signature Date/Time: 01/31/2024/5:17:07 PM    Final    VAS US  LOWER EXTREMITY VENOUS (DVT) Result Date: 01/30/2024  Lower Venous DVT Study Patient Name:   Dakota Becker [facility]  Date of Exam:   01/30/2024 Medical Rec #: [id number]        Accession #:    [id number] Date of Birth: 01/08/1976        Patient Gender: M Patient Age:   59 years Exam Location:  [facility] Procedure:      VAS US  LOWER EXTREMITY VENOUS (DVT) Referring Phys: Russ Course --------------------------------------------------------------------------------  Indications: Pain, and Swelling.  Limitations: Poor ultrasound/tissue interface. Comparison Study: Previous exam on 01/05/2024 was negative for DVT. Performing Technologist: Arlyce Berger RVT, RDMS  Examination Guidelines: A complete evaluation includes B-mode imaging, spectral Doppler, color Doppler, and power Doppler as needed of all accessible portions of each vessel. Bilateral testing is considered an integral part of a complete examination. Limited examinations for reoccurring indications may be performed as noted. The reflux portion of the exam is performed with the  patient in reverse Trendelenburg.  +-----+---------------+---------+-----------+----------+--------------+ RIGHTCompressibilityPhasicitySpontaneityPropertiesThrombus Aging +-----+---------------+---------+-----------+----------+--------------+ CFV  Full           Yes      Yes                                 +-----+---------------+---------+-----------+----------+--------------+   +---------+---------------+---------+-----------+----------+--------------+ LEFT     CompressibilityPhasicitySpontaneityPropertiesThrombus Aging +---------+---------------+---------+-----------+----------+--------------+ CFV      Full           Yes      Yes                                 +---------+---------------+---------+-----------+----------+--------------+ SFJ      Full                                                        +---------+---------------+---------+-----------+----------+--------------+ FV Prox  Full           Yes      Yes                                  +---------+---------------+---------+-----------+----------+--------------+ FV Mid   Full           Yes      Yes                                 +---------+---------------+---------+-----------+----------+--------------+ FV DistalFull           Yes      Yes                                 +---------+---------------+---------+-----------+----------+--------------+ PFV      Full                                                        +---------+---------------+---------+-----------+----------+--------------+ POP      Full           Yes      Yes                                 +---------+---------------+---------+-----------+----------+--------------+ PTV      Full                                                        +---------+---------------+---------+-----------+----------+--------------+ PERO     None           No       No                   Acute          +---------+---------------+---------+-----------+----------+--------------+     Summary: RIGHT: - No evidence  of common femoral vein obstruction.   LEFT: - Findings consistent with acute deep vein thrombosis involving the left peroneal veins.  - Ultrasound characteristics of enlarged lymph nodes noted in the groin. Subcutaneous edema in area of calf and ankle.  *See table(s) above for measurements and observations. Electronically signed by Delaney Fearing on 01/30/2024 at 2:53:10 PM.    Final    VAS US  LOWER EXTREMITY ARTERIAL DUPLEX Result Date: 01/30/2024 LOWER EXTREMITY ARTERIAL DUPLEX STUDY Patient Name:  Dakota Becker [facility]  Date of Exam:   01/30/2024 Medical Rec #: [id number]        Accession #:    [id number] Date of Birth: 1976/05/13        Patient Gender: M Patient Age:   29 years Exam Location:  [facility] Procedure:      VAS US  LOWER EXTREMITY ARTERIAL DUPLEX Referring Phys: Russ Course --------------------------------------------------------------------------------  Indications: Gangrene.  High Risk Factors: Hypertension, hyperlipidemia, Diabetes, no history of                    smoking.  Current ABI: n/a Comparison Study: No previous exams Performing Technologist: Jody Hill RVT, RDMS  Examination Guidelines: A complete evaluation includes B-mode imaging, spectral Doppler, color Doppler, and power Doppler as needed of all accessible portions of each vessel. Bilateral testing is considered an integral part of a complete examination. Limited examinations for reoccurring indications may be performed as noted.  +---------+--------+-----+--------+--------+--------+ RIGHT    PSV cm/sRatioStenosisWaveformComments +---------+--------+-----+--------+--------+--------+ PERO Prox86                                    +---------+--------+-----+--------+--------+--------+ PERO Mid 49                                    +---------+--------+-----+--------+--------+--------+  +-----------+--------+-----+--------+---------+--------+ LEFT       PSV cm/sRatioStenosisWaveform Comments +-----------+--------+-----+--------+---------+--------+ CFA Mid    92                   biphasic          +-----------+--------+-----+--------+---------+--------+ DFA        60                   biphasic          +-----------+--------+-----+--------+---------+--------+ SFA Prox   94                   biphasic          +-----------+--------+-----+--------+---------+--------+ SFA Mid    125                  triphasic         +-----------+--------+-----+--------+---------+--------+ SFA Distal 81                   triphasic         +-----------+--------+-----+--------+---------+--------+ POP Prox   74                   triphasic         +-----------+--------+-----+--------+---------+--------+ POP Distal 66                   triphasic         +-----------+--------+-----+--------+---------+--------+ TP Trunk   84  triphasic          +-----------+--------+-----+--------+---------+--------+ ATA Prox   76                   triphasic         +-----------+--------+-----+--------+---------+--------+ ATA Mid    111                  triphasic         +-----------+--------+-----+--------+---------+--------+ ATA Distal 121                  triphasic         +-----------+--------+-----+--------+---------+--------+ PTA Prox   119                  triphasic         +-----------+--------+-----+--------+---------+--------+ PTA Mid    118                  triphasic         +-----------+--------+-----+--------+---------+--------+ PTA Distal 89                   triphasic         +-----------+--------+-----+--------+---------+--------+ PERO Prox  92                   triphasic         +-----------+--------+-----+--------+---------+--------+ PERO Mid   49                   biphasic          +-----------+--------+-----+--------+---------+--------+ PERO Distal52                   triphasic         +-----------+--------+-----+--------+---------+--------+ Unable to image DPA due to wound vac/bandage placement.  Summary: Left: No evidence of arterial stenosis.  See table(s) above for measurements and observations. Electronically signed by Delaney Fearing on 01/30/2024 at 2:52:57 PM.    Final         Scheduled Meds:  acetaminophen   650 mg Rectal Once   apixaban  10 mg Oral BID   Followed by   Cecily Cohen ON 02/06/2024] apixaban  5 mg Oral BID   gabapentin  300 mg Oral TID   insulin aspart  0-15 Units Subcutaneous TID WC   insulin aspart  0-5 Units Subcutaneous QHS   linezolid  600 mg Oral Q12H   lisinopril  20 mg Oral Daily   mupirocin ointment   Topical Daily   pneumococcal 20-valent conjugate vaccine  0.5 mL Intramuscular Tomorrow-1000   polyethylene glycol  17 g Oral BID   senna-docusate  1 tablet Oral BID   Continuous Infusions:  ampicillin-sulbactam (UNASYN) IV 3 g (01/31/24 1840)      LOS: 4 days    Time spent: 40 minutes    Hilda Lovings, MD [facility]   To contact the attending provider between 7A-7P or the covering provider during after hours 7P-7A, please log into the web site [email or web address] and access using universal [place] password for that web site. If you do not have the password, please call the hospital operator.  01/31/2024, 7:02 PM

## 2024-01-31 NOTE — Telephone Encounter (Signed)
 Patient Product/process development scientist completed.    The patient is insured through [employer]. Patient has [employer], may use a copay card, and/or apply for patient assistance if available.    Ran test claim for [employer] and Requires Prior Authorization  Ran test claim for [employer] and Requires Prior Authorization  This test claim was processed through [employer]- copay amounts may vary at other pharmacies due to [employer], or as the patient moves through the different stages of their insurance plan.     Morgan Arab, CPHT Pharmacy Technician III Certified Patient Advocate [facility] Patient Advocate Team Direct Number: [phone]  Fax: [phone]

## 2024-01-31 NOTE — Telephone Encounter (Signed)
 Pharmacy Patient Advocate Encounter   Received notification from Inpatient Request that prior authorization for FreeStyle Libre 3 Plus Sensor is required/requested.   Insurance verification completed.   The patient is insured through [employer] .   Per test claim: PA required; PA started via CoverMyMeds. KEY [id number] . Waiting for clinical questions to populate.

## 2024-01-31 NOTE — Evaluation (Signed)
 Occupational Therapy Evaluation Patient Details Name: Dakota Becker MRN: [id number] DOB: 08-20-1976 Today's Date: 01/31/2024   History of Present Illness   Pt is a 48 y.o. male presenting 5/3 with L foot pain, blisters on L great toe. Found to have gangrene, osteomyelitis of first ray and 2nd toe. 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 Underwent L foot partial second MT resection and wound vac placement on 01/29/24.  PMH significant for DM, HTN, HLD.     Clinical Impressions Pt at this time concern about not being able to have a BM and nursing is aware. He was able to complete bed mobility with supervision and increase in time. He then completed donning post op shoe with min assist at EOB and completed multiple transfers to and from chair with step pivot. Pt max HR in session 137. Patient will benefit from continued inpatient follow up therapy, <3 hours/day.     If plan is discharge home, recommend the following:   A little help with walking and/or transfers;A little help with bathing/dressing/bathroom;Assistance with cooking/housework;Assist for transportation;Help with stairs or ramp for entrance     Functional Status Assessment   Patient has had a recent decline in their functional status and demonstrates the ability to make significant improvements in function in a reasonable and predictable amount of time.     Equipment Recommendations    (TBD but if to go home RW and shower seate vs transfer bench)     Recommendations for Other Services         Precautions/Restrictions   Precautions Precautions: Fall Other Brace: post-op shoe on L Restrictions Weight Bearing Restrictions Per Provider Order: Yes LLE Weight Bearing Per Provider Order: Non weight bearing     Mobility Bed Mobility Overal bed mobility: Needs Assistance Bed Mobility: Supine to Sit     Supine to sit: Supervision Sit to supine: Supervision         Transfers Overall transfer level: Needs assistance Equipment used: Rolling walker (2 wheels) Transfers: Sit to/from Stand Sit to Stand: Contact guard assist, Supervision Stand pivot transfers: Contact guard assist   Step pivot transfers: Contact guard assist     General transfer comment: with increase in trials was able to complete last transfer with no cues      Balance Overall balance assessment: Needs assistance Sitting-balance support: Feet supported Sitting balance-Leahy Scale: Good     Standing balance support: Bilateral upper extremity supported Standing balance-Leahy Scale: Poor Standing balance comment: due to maintaining NWB on L                           ADL either performed or assessed with clinical judgement   ADL Overall ADL's : Needs assistance/impaired Eating/Feeding: Independent;Sitting   Grooming: Wash/dry hands;Wash/dry face;Set up;Sitting   Upper Body Bathing: Set up;Sitting   Lower Body Bathing: Minimal assistance;Sit to/from stand;Sitting/lateral leans   Upper Body Dressing : Set up;Sitting   Lower Body Dressing: Minimal assistance;Sit to/from stand;Sitting/lateral leans   Toilet Transfer: Contact guard assist;Minimal assistance;Stand-pivot;Squat-pivot;Cueing for sequencing;Cueing for safety   Toileting- Clothing Manipulation and Hygiene: Minimal assistance       Functional mobility during ADLs: Contact guard assist;Rolling walker (2 wheels);Cueing for sequencing;Cueing for safety General ADL Comments: multiple transfers 2 ft     Vision Baseline Vision/History: 0 No visual deficits Ability to See in Adequate Light: 0 Adequate Patient Visual Report: No change from baseline Vision  Assessment?: No apparent visual deficits     Perception         Praxis         Pertinent Vitals/Pain Pain Assessment Pain Assessment: No/denies pain Pain Score: 6  Pain Location: L foot with dependency Pain Descriptors / Indicators:  Throbbing, Aching Pain Intervention(s): Limited activity within patient's tolerance, Monitored during session, Repositioned     Extremity/Trunk Assessment Upper Extremity Assessment Upper Extremity Assessment: Overall WFL for tasks assessed   Lower Extremity Assessment Lower Extremity Assessment: Defer to PT evaluation   Cervical / Trunk Assessment Cervical / Trunk Assessment: Kyphotic   Communication Communication Communication: No apparent difficulties   Cognition Arousal: Alert Behavior During Therapy: WFL for tasks assessed/performed                                 Following commands: Intact       Cueing  General Comments   Cueing Techniques: Verbal cues  wife in room entire session   Exercises     Shoulder Instructions      Home Living Family/patient expects to be discharged to:: Private residence Living Arrangements: Spouse/significant other Available Help at Discharge: Family;Available PRN/intermittently (wife is a Runner, broadcasting/film/video and works, father can assist a little) Type of Home: Apartment Home Access: Stairs to enter Secretary/administrator of Steps: flight Entrance Stairs-Rails: Right Home Layout: One level     Bathroom Shower/Tub: Chief Strategy Officer: Standard     Home Equipment: None   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[employer]      Prior Functioning/Environment Prior Level of Function : Independent/Modified Independent;Working/employed;Driving                    OT Problem List: Decreased strength;Decreased activity tolerance;Impaired balance (sitting and/or standing);Decreased safety awareness;Decreased knowledge of use of DME or AE;Pain   OT Treatment/Interventions: Self-care/ADL training;Therapeutic exercise;DME and/or AE instruction;Therapeutic activities;Patient/family education;Balance training      OT Goals(Current goals can be found in the care plan section)   Acute Rehab OT Goals Patient Stated Goal:  to get better OT Goal Formulation: With patient Time For Goal Achievement: 02/14/24 Potential to Achieve Goals: Good   OT Frequency:  Min 2X/week    Co-evaluation              AM-PAC OT "6 Clicks" Daily Activity     Outcome Measure Help from another person eating meals?: None Help from another person taking care of personal grooming?: A Little Help from another person toileting, which includes using toliet, bedpan, or urinal?: A Lot Help from another person bathing (including washing, rinsing, drying)?: A Lot Help from another person to put on and taking off regular upper body clothing?: A Little Help from another person to put on and taking off regular lower body clothing?: A Little 6 Click Score: 17   End of Session Equipment Utilized During Treatment: Rolling walker (2 wheels);Gait belt Nurse Communication: Mobility status  Activity Tolerance: Patient tolerated treatment well Patient left: in chair;with call bell/phone within reach;with family/visitor present  OT Visit Diagnosis: Unsteadiness on feet (R26.81);Other abnormalities of gait and mobility (R26.89);Repeated falls (R29.6);Muscle weakness (generalized) (M62.81);Pain Pain - Right/Left: Left Pain - part of body: Leg                Time: 0825-0903 OT Time Calculation (min): 38 min Charges:  OT General Charges $OT Visit: 1 Visit OT Evaluation $OT Eval Low Complexity:  1 Low OT Treatments $Self Care/Home Management : 23-37 mins  Dakota Becker OTR/L  Acute Rehab Services  [phone] office number   Dakota Becker 01/31/2024, 9:15 AM

## 2024-01-31 NOTE — Telephone Encounter (Signed)
 Clinical Questions have been submitted

## 2024-01-31 NOTE — Progress Notes (Signed)
 Physical Therapy Treatment Patient Details Name: Dakota Becker MRN: [id number] DOB: 11/12/1975 Today's Date: 01/31/2024   History of Present Illness Pt is a 48 y.o. male presenting 5/3 with L foot pain, blisters on L great toe. Found to have gangrene, osteomyelitis of first ray and 2nd toe. 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 Underwent L foot partial second MT resection and wound vac placement on 01/29/24.  PMH significant for DM, HTN, HLD.    PT Comments  Today's session focused on gait training. Pt demonstrated a hopping technique where he maintained LLE NWB at all times. He engaged in three bouts of short-distance ambulation using RW with CGA and a chair follow. PT facilitated seated rest breaks d/t pt's tachycardia (HRmax 148 bpm) and LE fatigue. Patient will benefit from continued inpatient follow up therapy, <3 hours/day.    If plan is discharge home, recommend the following: A little help with walking and/or transfers;A little help with bathing/dressing/bathroom;Assistance with cooking/housework;Assist for transportation;Help with stairs or ramp for entrance   Can travel by private vehicle     Yes  Equipment Recommendations  Crutches;Rolling walker (2 wheels);BSC/3in1    Recommendations for Other Services       Precautions / Restrictions Precautions Precautions: Fall Recall of Precautions/Restrictions: Intact Precaution/Restrictions Comments: watch HR Required Braces or Orthoses: Other Brace Other Brace: post-op shoe on L Restrictions Weight Bearing Restrictions Per Provider Order: Yes LLE Weight Bearing Per Provider Order: Non weight bearing     Mobility  Bed Mobility               General bed mobility comments: Not assessed. Pt greeted in recliner chair and returned there at end of session.    Transfers Overall transfer level: Needs assistance Equipment used: Rolling walker (2 wheels) Transfers: Sit to/from  Stand Sit to Stand: Supervision           General transfer comment: Pt stood from recliner chair by pushing up with BUE support on arm rests and transitioning to RW grips. No physical assist required. Good eccentric control with sitting.    Ambulation/Gait Ambulation/Gait assistance: Contact guard assist, +2 safety/equipment (Chair Follow) Gait Distance (Feet): 8 Feet (x3, seated rest after each bout) Assistive device: Rolling walker (2 wheels) Gait Pattern/deviations: Step-to pattern, Decreased stride length Gait velocity: decreased Gait velocity interpretation: <1.8 ft/sec, indicate of risk for recurrent falls   General Gait Details: Pt maintained LLE NWB at all times. He demonstrated a hop technique, increased WBing into BUE in order to advance RLE. Pt kept an upright posture and required seated rest breaks to recover from elevated HR and LE fatigue.   Stairs             Wheelchair Mobility     Tilt Bed    Modified Rankin (Stroke Patients Only)       Balance Overall balance assessment: Needs assistance Sitting-balance support: Feet supported Sitting balance-Leahy Scale: Good     Standing balance support: Bilateral upper extremity supported, Reliant on assistive device for balance, During functional activity Standing balance-Leahy Scale: Poor Standing balance comment: Pt dependent on RW                            Communication Communication Communication: No apparent difficulties  Cognition Arousal: Alert Behavior During Therapy: WFL for tasks assessed/performed   PT - Cognitive impairments: No apparent impairments  Following commands: Intact      Cueing Cueing Techniques: Verbal cues  Exercises      General Comments General comments (skin integrity, edema, etc.): Cousin in room and supportive throughout session. Pt became tachycardic with gait, HRmax 148bpm, recovered to 120s following seated rest.       Pertinent Vitals/Pain Pain Assessment Pain Assessment: Faces Faces Pain Scale: Hurts even more Pain Location: L foot with dependency Pain Descriptors / Indicators: Throbbing, Aching Pain Intervention(s): Monitored during session, Repositioned    Home Living Family/patient expects to be discharged to:: Private residence Living Arrangements: Spouse/significant other Available Help at Discharge: Family;Available PRN/intermittently (wife is a Runner, broadcasting/film/video and works, father can assist a little) Type of Home: Apartment Home Access: Stairs to enter Entrance Stairs-Rails: Right Entrance Stairs-Number of Steps: flight   Home Layout: One level Home Equipment: None 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 Function            PT Goals (current goals can now be found in the care plan section) Acute Rehab PT Goals Patient Stated Goal: Regain my independence Progress towards PT goals: Progressing toward goals    Frequency    Min 2X/week      PT Plan      Co-evaluation              AM-PAC PT "6 Clicks" Mobility   Outcome Measure  Help needed turning from your back to your side while in a flat bed without using bedrails?: A Little Help needed moving from lying on your back to sitting on the side of a flat bed without using bedrails?: A Little Help needed moving to and from a bed to a chair (including a wheelchair)?: A Little Help needed standing up from a chair using your arms (e.g., wheelchair or bedside chair)?: A Little Help needed to walk in hospital room?: Total Help needed climbing 3-5 steps with a railing? : Total 6 Click Score: 14    End of Session Equipment Utilized During Treatment: Gait belt;Other (comment) (post-op shoe) Activity Tolerance: Patient tolerated treatment well;Patient limited by fatigue;Treatment limited secondary to medical complications (Comment) (tachycardia, HRmax 148bpm) Patient left: in chair;with call bell/phone within reach;with  family/visitor present Nurse Communication: Mobility status;Other (comment) (tachycardia with gait) PT Visit Diagnosis: Difficulty in walking, not elsewhere classified (R26.2);Pain Pain - Right/Left: Left Pain - part of body: Ankle and joints of foot     Time: 1015-1040 PT Time Calculation (min) (ACUTE ONLY): 25 min  Charges:    $Gait Training: 23-37 mins PT General Charges $$ ACUTE PT VISIT: 1 Visit                     Glenford Lanes, PT, DPT Acute Rehabilitation Services Office: [phone] Secure Chat Preferred  Riva Chester 01/31/2024, 11:23 AM

## 2024-01-31 NOTE — Progress Notes (Signed)
  Echocardiogram 2D Echocardiogram has been performed.  Dakota Becker 01/31/2024, 2:58 PM

## 2024-02-01 ENCOUNTER — Telehealth (HOSPITAL_COMMUNITY): Payer: Self-pay | Admitting: Pharmacy Technician

## 2024-02-01 ENCOUNTER — Other Ambulatory Visit (HOSPITAL_COMMUNITY): Payer: Self-pay

## 2024-02-01 DIAGNOSIS — A480 Gas gangrene: Secondary | ICD-10-CM | POA: Diagnosis not present

## 2024-02-01 DIAGNOSIS — M86172 Other acute osteomyelitis, left ankle and foot: Secondary | ICD-10-CM | POA: Diagnosis not present

## 2024-02-01 DIAGNOSIS — E119 Type 2 diabetes mellitus without complications: Secondary | ICD-10-CM | POA: Diagnosis not present

## 2024-02-01 DIAGNOSIS — E1152 Type 2 diabetes mellitus with diabetic peripheral angiopathy with gangrene: Secondary | ICD-10-CM

## 2024-02-01 DIAGNOSIS — E1169 Type 2 diabetes mellitus with other specified complication: Secondary | ICD-10-CM | POA: Diagnosis not present

## 2024-02-01 DIAGNOSIS — M869 Osteomyelitis, unspecified: Secondary | ICD-10-CM | POA: Diagnosis not present

## 2024-02-01 DIAGNOSIS — B9689 Other specified bacterial agents as the cause of diseases classified elsewhere: Secondary | ICD-10-CM | POA: Diagnosis not present

## 2024-02-01 LAB — CBC WITH DIFFERENTIAL/PLATELET
Abs Immature Granulocytes: 0.12 10*3/uL — ABNORMAL HIGH (ref 0.00–0.07)
Basophils Absolute: 0 10*3/uL (ref 0.0–0.1)
Basophils Relative: 0 %
Eosinophils Absolute: 0.1 10*3/uL (ref 0.0–0.5)
Eosinophils Relative: 1 %
HCT: 36.9 % — ABNORMAL LOW (ref 39.0–52.0)
Hemoglobin: 12 g/dL — ABNORMAL LOW (ref 13.0–17.0)
Immature Granulocytes: 1 %
Lymphocytes Relative: 12 %
Lymphs Abs: 1.8 10*3/uL (ref 0.7–4.0)
MCH: 29.1 pg (ref 26.0–34.0)
MCHC: 32.5 g/dL (ref 30.0–36.0)
MCV: 89.3 fL (ref 80.0–100.0)
Monocytes Absolute: 1.3 10*3/uL — ABNORMAL HIGH (ref 0.1–1.0)
Monocytes Relative: 9 %
Neutro Abs: 11.7 10*3/uL — ABNORMAL HIGH (ref 1.7–7.7)
Neutrophils Relative %: 77 %
Platelets: 443 10*3/uL — ABNORMAL HIGH (ref 150–400)
RBC: 4.13 MIL/uL — ABNORMAL LOW (ref 4.22–5.81)
RDW: 13.5 % (ref 11.5–15.5)
WBC: 15 10*3/uL — ABNORMAL HIGH (ref 4.0–10.5)
nRBC: 0 % (ref 0.0–0.2)

## 2024-02-01 LAB — MAGNESIUM: Magnesium: 2 mg/dL (ref 1.7–2.4)

## 2024-02-01 LAB — GLUCOSE, CAPILLARY
Glucose-Capillary: 227 mg/dL — ABNORMAL HIGH (ref 70–99)
Glucose-Capillary: 228 mg/dL — ABNORMAL HIGH (ref 70–99)
Glucose-Capillary: 255 mg/dL — ABNORMAL HIGH (ref 70–99)
Glucose-Capillary: 263 mg/dL — ABNORMAL HIGH (ref 70–99)
Glucose-Capillary: 265 mg/dL — ABNORMAL HIGH (ref 70–99)

## 2024-02-01 LAB — BASIC METABOLIC PANEL WITH GFR
Anion gap: 13 (ref 5–15)
BUN: 9 mg/dL (ref 6–20)
CO2: 24 mmol/L (ref 22–32)
Calcium: 9.2 mg/dL (ref 8.9–10.3)
Chloride: 97 mmol/L — ABNORMAL LOW (ref 98–111)
Creatinine, Ser: 0.76 mg/dL (ref 0.61–1.24)
GFR, Estimated: >60 mL/min (ref >60)
Glucose, Bld: 229 mg/dL — ABNORMAL HIGH (ref 70–99)
Potassium: 4.1 mmol/L (ref 3.5–5.1)
Sodium: 134 mmol/L — ABNORMAL LOW (ref 135–145)

## 2024-02-01 MED ORDER — SENNOSIDES-DOCUSATE SODIUM 8.6-50 MG PO TABS
1.0000 | ORAL_TABLET | Freq: Every day | ORAL | Status: DC
  Administered 2024-02-02 – 2024-02-07 (×5): 1 via ORAL
  Filled 2024-02-01 (×6): qty 1

## 2024-02-01 MED ORDER — INSULIN STARTER KIT- PEN NEEDLES (ENGLISH)
1.0000 | Freq: Once | Status: AC
  Administered 2024-02-01: 1
  Filled 2024-02-01: qty 1

## 2024-02-01 MED ORDER — POLYETHYLENE GLYCOL 3350 17 G PO PACK
17.0000 g | PACK | Freq: Every day | ORAL | Status: DC
  Administered 2024-02-04 – 2024-02-08 (×5): 17 g via ORAL
  Filled 2024-02-01 (×7): qty 1

## 2024-02-01 MED ORDER — INSULIN GLARGINE-YFGN 100 UNIT/ML ~~LOC~~ SOLN
10.0000 [IU] | SUBCUTANEOUS | Status: DC
  Administered 2024-02-01: 10 [IU] via SUBCUTANEOUS
  Filled 2024-02-01 (×2): qty 0.1

## 2024-02-01 NOTE — Inpatient Diabetes Management (Signed)
 Inpatient Diabetes Program Recommendations  AACE/ADA: New Consensus Statement on Inpatient Glycemic Control (2015)  Target Ranges:  Prepandial:   less than 140 mg/dL      Peak postprandial:   less than 180 mg/dL (1-2 hours)      Critically ill patients:  140 - 180 mg/dL   Lab Results  Component Value Date   GLUCAP 228 (H) 02/01/2024   HGBA1C 9.8 (H) 01/27/2024    Latest Reference Range & Units 01/31/24 06:41 01/31/24 11:35 01/31/24 15:56 01/31/24 21:34 02/01/24 05:43  Glucose-Capillary 70 - 99 mg/dL 604 (H) 540 (H) 981 (H) 235 (H) 228 (H)  (H): Data is abnormally high   Diabetes history: DM 2 Outpatient Diabetes Medications: Glipizide 10 mg bid, Jardiance 10 mg Daily, Rybelsus 3 mg Daily Current orders for Inpatient glycemic control:  Lantus 10 units daily, Novolog 0-15 units tid + hs  Inpatient Diabetes Program Recommendations:   Late entry: Met with patient @ bedside on 01/31/24 and discussed diabetes management. Patient requests a referral to an endocrinologist and list of local endocrinologists. Also interested in CGM.  Added Lantus 10 units daily today. Reviewed with patient basal insulin with review of normal ranges of fasting CBGs and postprandial CBGs. Also reviewed hypoglycemia with protocol and patient was able to verbalize back accurately.  Educated patient on insulin pen use at home. Reviewed contents of insulin flexpen starter kit. Reviewed all steps if insulin pen including attachment of needle, 2-unit air shot, dialing up dose, giving injection, removing needle, disposal of sharps, storage of unused insulin, disposal of insulin etc. Patient able to provide successful return demonstration. Also reviewed troubleshooting with insulin pen. MD to give patient Rxs for insulin pens and insulin pen needles.   Gave patient Jerrilyn Moras 3 sensors (3) for home use. Patient has procedure scheduled and wanted to wait until after procedure.  Thank you, Tehya Leath E. Amani Marseille, RN, MSN, CDCES   Diabetes Coordinator Inpatient Glycemic Control Team Team Pager [phone] (8am-5pm) 02/01/2024 10:35 AM

## 2024-02-01 NOTE — Progress Notes (Signed)
 PODIATRY PROGRESS NOTE Patient Name: ARLIN VORWALD  DOB [birth year] DOA 01/27/2024  Hospital Day: 6  Assessment:  48 y.o. 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 Partial first ray amputation of left foot 2. 2nd toe amputation at MPJ level of left foot 3. Application dissolvable antibiotic beads left foot  DOS: 01/29/24 1.  Repeat irrigation and debridement partial second metatarsal resection, left foot 2.  Dermal allograft application 5 x 4 cm, left foot 3.  Application negative pressure wound therapy 5 x 4 cm, left foot      AF WBC 15  Path: A. FOOT, PARTIAL LEFT FIRST RAY AND 2ND TOE, AMPUTATION:  - Benign bone with mild acute inflammation and bacterial colonies  - Margin shows acute inflammation and necrosis, and appears partially viable  - No malignancy identified   Cultures:   MODERATE ENTEROCOCCUS FAECALIS MODERATE STREPTOCOCCUS GROUP F FEW PREVOTELLA SPECIES    CT L foot: Soft tissue emphysema of 1st ray, intraosseous gas in phalanges of great toe, osteomyelitis great toe  Plan:  - S/p Repeat debridement with partial 2nd met resection, graft application partial closure of surgical site.  - Overall continuing to progress postop - Continue to trend leukocytosis, slowly improving - he is at high risk for needing further surgery and limb loss given severity of infection and uncontrolled DM - ABX per ID recommendations appreciate - Anticoagulation: On Eliquis due to DVT, continue - Wound care: Plan to do wound VAC change tomorrow hopefully will be able to be present with wound care nurse appreciate assistance.  This will determine if any further surgical intervention is required. - WB status: NWB LLE, does not need to wear Post op shoe while in bed - Will continue to follow        Maridee Shoemaker, DPM [facility]     Subjective:  Discussed plan for wound VAC change tomorrow.  Will determine if any further surgery is indicated.  He is determining if he wants to go to a rehab facility versus home with home health.  All questions and concerns were answered  Objective:   Vitals:   02/01/24 0700 02/01/24 1100  BP: 130/74 122/69  Pulse: 96 (!) 101  Resp: 14 14  Temp: (!) 97.4 F (36.3 C) (!) 97.4 F (36.3 C)  SpO2:  94%       Latest Ref Rng & Units 02/01/2024    2:50 AM 01/31/2024    2:41 AM 01/30/2024    3:57 AM  CBC  WBC 4.0 - 10.5 K/uL 15.0  15.9  19.0   Hemoglobin 13.0 - 17.0 g/dL 14.7  82.9  56.2   Hematocrit 39.0 - 52.0 % 36.9  36.4  38.0   Platelets 150 - 400 K/uL 443  423  405        Latest Ref Rng & Units 02/01/2024    2:50 AM 01/31/2024    2:41 AM 01/30/2024    3:57 AM  BMP  Glucose 70 - 99 mg/dL 130  865  784   BUN 6 - 20 mg/dL 9  9  7    Creatinine 0.61 - 1.24 mg/dL 6.96  2.95  2.84   Sodium 135 - 145 mmol/L 134  135  134   Potassium 3.5 - 5.1 mmol/L 4.1  4.1  4.1   Chloride 98 -  111 mmol/L 97  98  99   CO2 22 - 32 mmol/L 24  23  20    Calcium 8.9 - 10.3 mg/dL 9.2  9.1  9.1     General: AAOx3, NAD  Lower Extremity Exam Dressing C/D/I to left foot Wound vac intact and functioning 100 mL serosanguinous in canister Pain with compression of left calf Able to DF/PF left ankle     Radiology:  Results reviewed. See assessment for pertinent imaging results

## 2024-02-01 NOTE — Telephone Encounter (Signed)
 Pharmacy Patient Advocate Encounter  Received notification from [facility] that Prior Authorization for FreeStyle Libre 3 Plus Sensor  has been DENIED.  Full denial letter will be uploaded to the media tab. See denial reason below.   PA #/Case ID/Reference #: [id number]

## 2024-02-01 NOTE — Progress Notes (Signed)
   [facility] has been requested to perform a transesophageal echocardiogram on Dakota Becker for bacteremia.  After careful review of history and examination, the risks and benefits of transesophageal echocardiogram have been explained including risks of esophageal damage, perforation (1:10,000 risk), bleeding, pharyngeal hematoma as well as other potential complications associated with conscious sedation including aspiration, arrhythmia, respiratory failure and death. Alternatives to treatment were discussed, questions were answered. Patient is willing to proceed.   Plan for tomorrow.   Burnetta Cart, PA-C  02/01/2024 3:08 PM

## 2024-02-01 NOTE — Progress Notes (Signed)
 PROGRESS NOTE    Dakota Becker  NWG:[id number] DOB: April 15, 1976 DOA: 01/27/2024 PCP: Alejandro Hurt, FNP    Chief Complaint  Patient presents with   Foot Pain   Wound Infection   Code Sepsis    Brief Narrative:   Dakota Becker is a 48 y.o. male with medical history significant of diabetes, essential hypertension, hyperlipidemia, with great toe ulcer developed some blisters.  Not taking his diabetic medication.  He went to PCP and was put on oral antibiotics and referred to podiatry.  Recommended wound management but he never got contacted so started having increasing swelling in the left toe with pain for 5 days or so with some chills and nausea ,vomiting and foul odor.  Patient then presented to [facility].  Upon arrival to the ED podiatry was consulted.  Patient  met sepsis criteria secondary to osteomyelitis of the left great toe.  Patient was then taken to the OR and underwent left hallux amputation.  Patient underwent repeat debridement on 01/29/2024 by podiatry and ID  has been consulted for antibiotic management..    Assessment & Plan:   Principal Problem:   Toe osteomyelitis, left (HCC) Active Problems:   Gas gangrene of foot (HCC)   Gangrene of toe of left foot (HCC)   Controlled type 2 diabetes mellitus without complication, without long-term current use of insulin (HCC)   Hypercalcemia   Constipation   Hypertension   Acute deep vein thrombosis (DVT) of left lower extremity (HCC)   Type 2 diabetes mellitus without complication, without long-term current use of insulin (HCC)  #1 sepsis secondary to gas gangrene of the left great toe with osteomyelitis/Streptococcus bacteremia -Patient seen in consultation by podiatry and underwent surgical intervention on admission with partial first ray amputation of the left foot and second toe amputation at MPJ level of the left foot with application of dissolvable antibiotic beads of the left foot. - Patient followed by  podiatry underwent repeat irrigation and debridement with allograft application with wound VAC treatment on 01/29/2024. - Per podiatry patient high risk for need for revision including transmetatarsal amputation. - ID consulted and patient currently on linezolid and Unasyn. - NWB per podiatry. - Blood cultures from 01/27/2024 with Streptococcus constellatus with repeat blood cultures from 01/29/2024 with no growth to date. - Wound cultures with Enterococcus and Streptococcus noted. - Patient with a leukocytosis which has started to trend down. - Fever curve has trended down. - 2D echo with a EF of 60 to 65%, NWMA, grade 1 diastolic dysfunction, no vegetations noted. -ID recommending TEE to rule out vegetations. -Cardiology consulted per ID for TEE. - Continue Unasyn, linezolid. - ID and podiatry following and I appreciate the input and recommendations.  2.  Left lower extremity DVT -Patient started on Eliquis for anticoagulation as no further surgical intervention planned at this time.  3.  Nausea/decreased appetite - Resolved.  4.  Constipation - Patient placed on MiraLAX twice daily, Senokot-S twice daily and received sorbitol x 2 doses as well as Dulcolax suppository with good results.   - Patient having ongoing bowel movements.   - Change MiraLAX to daily as well as Senokot S to daily.   - Follow.  5.  Diabetes mellitus type 2 -Hemoglobin A1c 9.8. - CBG 228 this morning. - Patient noted to be on glipizide, Jardiance, semaglutide prior to admission. -Start Semglee 10 units daily. - Continue SSI. - Diabetes coordinator following.  6.  Hypertension - BP stable.   -  Continue home regimen lisinopril 20 mg daily.   7.  Class I obesity -BMI of 32.84 kg/m. -Lifestyle modification - Outpatient follow-up with PCP.  8.  Hypercalcemia -Resolved.   DVT prophylaxis: Eliquis Code Status: Full Family Communication: Updated patient.  No family at bedside. Disposition: SNF  Status  is: Inpatient Remains inpatient appropriate because: Severity of illness   Consultants:  Podiatry: Dr. Rosemarie Conquest 01/27/2024 ID: Dr. Zelda Hickman 01/29/2024 Wound care RN  Procedures: 2D echo 01/31/2024 CT left ankle and left foot 01/27/2024 Plain films of the left foot 01/27/2024, 01/29/2024 Lower extremity Dopplers 01/30/2024 Lower extremity arterial duplex 01/30/2024 Partial first ray amputation left foot/second toe amputation at MPJ level of the left foot/application of dissolvable antibiotic beads left foot per podiatry: Dr. Demetria Finch 01/27/2024 Repeat irrigation and debridement partial second metatarsal resection, left foot/dermal allograft application 5 x 4 cm left foot/application negative pressure wound therapy 5/4 cm, left foot per podiatry: Dr. Rosemarie Conquest 01/29/2024  Antimicrobials: Anti-infectives (From admission, onward)    Start     Dose/Rate Route Frequency Ordered Stop   01/30/24 1200  Ampicillin-Sulbactam (UNASYN) 3 g in sodium chloride  0.9 % 100 mL IVPB        3 g 200 mL/hr over 30 Minutes Intravenous Every 6 hours 01/30/24 1103     01/29/24 2200  linezolid (ZYVOX) tablet 600 mg        600 mg Oral Every 12 hours 01/29/24 1040     01/29/24 1130  cefTRIAXone (ROCEPHIN) 2 g in sodium chloride  0.9 % 100 mL IVPB  Status:  Discontinued        2 g 200 mL/hr over 30 Minutes Intravenous Every 24 hours 01/29/24 1036 01/30/24 1103   01/28/24 1130  piperacillin-tazobactam (ZOSYN) IVPB 3.375 g  Status:  Discontinued        3.375 g 12.5 mL/hr over 240 Minutes Intravenous Every 8 hours 01/28/24 1035 01/29/24 1036   01/28/24 1000  metroNIDAZOLE (FLAGYL) IVPB 500 mg  Status:  Discontinued        500 mg 100 mL/hr over 60 Minutes Intravenous Every 12 hours 01/28/24 0925 01/28/24 1035   01/27/24 2200  linezolid (ZYVOX) IVPB 600 mg  Status:  Discontinued        600 mg 300 mL/hr over 60 Minutes Intravenous Every 12 hours 01/27/24 1953 01/27/24 2007   01/27/24 1835  vancomycin (VANCOCIN) powder  Status:   Discontinued          As needed 01/27/24 1835 01/27/24 1843   01/27/24 1834  tobramycin (NEBCIN) injection  Status:  Discontinued          As needed 01/27/24 1835 01/27/24 1843   01/27/24 1515  linezolid (ZYVOX) IVPB 600 mg  Status:  Discontinued        600 mg 300 mL/hr over 60 Minutes Intravenous Every 12 hours 01/27/24 1502 01/29/24 1036   01/27/24 1515  piperacillin-tazobactam (ZOSYN) IVPB 3.375 g        3.375 g 100 mL/hr over 30 Minutes Intravenous  Once 01/27/24 1505 01/27/24 1635   01/27/24 1430  vancomycin (VANCOCIN) IVPB 1000 mg/200 mL premix  Status:  Discontinued       Placed in "Followed by" Linked Group   1,000 mg 200 mL/hr over 60 Minutes Intravenous  Once 01/27/24 1419 01/27/24 1502   01/27/24 1430  vancomycin (VANCOCIN) IVPB 1000 mg/200 mL premix  Status:  Discontinued       Placed in "Followed by" Linked Group   1,000 mg 200 mL/hr over 60  Minutes Intravenous  Once 01/27/24 1419 01/27/24 1502   01/27/24 1415  Ampicillin-Sulbactam (UNASYN) 3 g in sodium chloride  0.9 % 100 mL IVPB  Status:  Discontinued       Placed in "And" Linked Group   3 g 200 mL/hr over 30 Minutes Intravenous  Once 01/27/24 1412 01/27/24 1635   01/27/24 1415  vancomycin (VANCOCIN) IVPB 1000 mg/200 mL premix  Status:  Discontinued       Placed in "And" Linked Group   1,000 mg 200 mL/hr over 60 Minutes Intravenous  Once 01/27/24 1412 01/27/24 1419         Subjective: Lying in bed.  States have been having good bowel movements.  Able to tolerate oral intake better now.  Denies any chest pain or shortness of breath.  No abdominal pain.  States was seen by podiatrist today who is going to change to wound care dressings tomorrow.  Stated saw ID who had recommended a TEE for further evaluation.   Objective: Vitals:   02/01/24 0700 02/01/24 1100 02/01/24 1130 02/01/24 1600  BP: 130/74 122/69 122/69 126/74  Pulse: 96 (!) 101  (!) 20  Resp: 14 14  20   Temp: (!) 97.4 F (36.3 C) (!) 97.4 F (36.3 C)   99 F (37.2 C)  TempSrc: Axillary Axillary  Oral  SpO2:  94%  98%  Weight:      Height:        Intake/Output Summary (Last 24 hours) at 02/01/2024 1947 Last data filed at 02/01/2024 1700 Gross per 24 hour  Intake 1080 ml  Output 1275 ml  Net -195 ml   Filed Weights   01/29/24 1113 01/30/24 0421 01/31/24 0425  Weight: 106.6 kg 112.9 kg 104.2 kg    Examination:  General exam: NAD Respiratory system: CTAB anterior lung fields.  No wheezes, no crackles, no rhonchi.  Fair air movement.  Speaking in full sentences.   Cardiovascular system: Regular rate rhythm no murmurs rubs or gallops.  No JVD.  No lower extremity edema.  Gastrointestinal system: Abdomen is soft, obese, nontender, nondistended, positive bowel sounds.  No rebound.  No guarding.  Central nervous system: Alert and oriented. No focal neurological deficits. Extremities: Left foot in postop bandage with wound VAC in place.  Skin: No rashes, lesions or ulcers Psychiatry: Judgement and insight appear normal. Mood & affect appropriate.     Data Reviewed: I have personally reviewed following labs and imaging studies  CBC: Recent Labs  Lab 01/27/24 1450 01/28/24 0351 01/29/24 0252 01/30/24 0357 01/31/24 0241 02/01/24 0250  WBC 17.6* 18.4* 18.5* 19.0* 15.9* 15.0*  NEUTROABS 16.4*  --   --   --   --  11.7*  HGB 14.3 12.2* 12.3* 12.6* 11.9* 12.0*  HCT 42.2 36.6* 37.6* 38.0* 36.4* 36.9*  MCV 87.9 88.6 88.7 88.4 88.3 89.3  PLT 468* 387 399 405* 423* 443*    Basic Metabolic Panel: Recent Labs  Lab 01/28/24 0351 01/29/24 0252 01/30/24 0357 01/31/24 0241 02/01/24 0250  NA 137 133* 134* 135 134*  K 3.9 4.0 4.1 4.1 4.1  CL 102 99 99 98 97*  CO2 17* 21* 20* 23 24  GLUCOSE 137* 174* 204* 206* 229*  BUN 9 9 7 9 9   CREATININE 1.12 0.91 0.89 0.84 0.76  CALCIUM 8.9 9.1 9.1 9.1 9.2  MG  --  1.8  --  2.0 2.0  PHOS  --   --  3.3  --   --     GFR:  Estimated Creatinine Clearance: 141.6 mL/min (by C-G formula based on  SCr of 0.76 mg/dL).  Liver Function Tests: Recent Labs  Lab 01/27/24 1450 01/28/24 0351  AST 34 25  ALT 18 23  ALKPHOS 191* 94  BILITOT 1.0 2.3*  PROT 8.0 6.8  ALBUMIN 3.8 2.2*    CBG: Recent Labs  Lab 01/31/24 1556 01/31/24 2134 02/01/24 0543 02/01/24 1129 02/01/24 1554  GLUCAP 227* 235* 228* 255* 263*     Recent Results (from the past 240 hours)  Blood Cultures x 2 sites     Status: Abnormal   Collection Time: 01/27/24  2:55 PM   Specimen: BLOOD LEFT FOREARM  Result Value Ref Range Status   Specimen Description BLOOD LEFT FOREARM  Final   Special Requests   Final    Blood Culture adequate volume BOTTLES DRAWN AEROBIC AND ANAEROBIC   Culture  Setup Time   Final    GRAM POSITIVE COCCI IN BOTH AEROBIC AND ANAEROBIC BOTTLES CRITICAL VALUE NOTED.  VALUE IS CONSISTENT WITH PREVIOUSLY REPORTED AND CALLED VALUE.    Culture (A)  Final    STREPTOCOCCUS CONSTELLATUS SUSCEPTIBILITIES PERFORMED ON PREVIOUS CULTURE WITHIN THE LAST 5 DAYS. Performed at [facility], [address] [place], [place] [address]    Report Status 01/30/2024 FINAL  Final  Blood Cultures x 2 sites     Status: Abnormal   Collection Time: 01/27/24  2:56 PM   Specimen: BLOOD  Result Value Ref Range Status   Specimen Description   Final    BLOOD RIGHT ANTECUBITAL Performed at [facility], [address], [place], [place] [address]    Special Requests   Final    Blood Culture adequate volume BOTTLES DRAWN AEROBIC AND ANAEROBIC   Culture  Setup Time   Final    GRAM POSITIVE COCCI IN CHAINS IN BOTH AEROBIC AND ANAEROBIC BOTTLES CRITICAL RESULT CALLED TO, READ BACK BY AND VERIFIED WITH: PHARMD Normie Becton [id number] AT 1956, ADC Performed at [facility], [address], [place], [place] [address]    Culture STREPTOCOCCUS CONSTELLATUS (A)  Final   Report Status 01/30/2024 FINAL  Final   Organism ID, Bacteria STREPTOCOCCUS CONSTELLATUS  Final      Susceptibility    Streptococcus constellatus - MIC*    PENICILLIN <=0.06 SENSITIVE Sensitive     CEFTRIAXONE 0.25 SENSITIVE Sensitive     ERYTHROMYCIN <=0.12 SENSITIVE Sensitive     LEVOFLOXACIN 0.5 SENSITIVE Sensitive     VANCOMYCIN 0.5 SENSITIVE Sensitive     * STREPTOCOCCUS CONSTELLATUS  Blood Culture ID Panel (Reflexed)     Status: Abnormal   Collection Time: 01/27/24  2:56 PM  Result Value Ref Range Status   Enterococcus faecalis NOT DETECTED NOT DETECTED Final   Enterococcus Faecium NOT DETECTED NOT DETECTED Final   Listeria monocytogenes NOT DETECTED NOT DETECTED Final   Staphylococcus species NOT DETECTED NOT DETECTED Final   Staphylococcus aureus (BCID) NOT DETECTED NOT DETECTED Final   Staphylococcus epidermidis NOT DETECTED NOT DETECTED Final   Staphylococcus lugdunensis NOT DETECTED NOT DETECTED Final   Streptococcus species DETECTED (A) NOT DETECTED Final    Comment: Not Enterococcus species, Streptococcus agalactiae, Streptococcus pyogenes, or Streptococcus pneumoniae. CRITICAL RESULT CALLED TO, READ BACK BY AND VERIFIED WITH: PHARMD TONY R. [id number] AT 1956, ADC    Streptococcus agalactiae NOT DETECTED NOT DETECTED Final   Streptococcus pneumoniae NOT DETECTED NOT DETECTED Final   Streptococcus pyogenes NOT DETECTED NOT DETECTED Final   A.calcoaceticus-baumannii NOT  DETECTED NOT DETECTED Final   Bacteroides fragilis NOT DETECTED NOT DETECTED Final   Enterobacterales NOT DETECTED NOT DETECTED Final   Enterobacter cloacae complex NOT DETECTED NOT DETECTED Final   Escherichia coli NOT DETECTED NOT DETECTED Final   Klebsiella aerogenes NOT DETECTED NOT DETECTED Final   Klebsiella oxytoca NOT DETECTED NOT DETECTED Final   Klebsiella pneumoniae NOT DETECTED NOT DETECTED Final   Proteus species NOT DETECTED NOT DETECTED Final   Salmonella species NOT DETECTED NOT DETECTED Final   Serratia marcescens NOT DETECTED NOT DETECTED Final   Haemophilus influenzae NOT DETECTED NOT DETECTED Final    Neisseria meningitidis NOT DETECTED NOT DETECTED Final   Pseudomonas aeruginosa NOT DETECTED NOT DETECTED Final   Stenotrophomonas maltophilia NOT DETECTED NOT DETECTED Final   Candida albicans NOT DETECTED NOT DETECTED Final   Candida auris NOT DETECTED NOT DETECTED Final   Candida glabrata NOT DETECTED NOT DETECTED Final   Candida krusei NOT DETECTED NOT DETECTED Final   Candida parapsilosis NOT DETECTED NOT DETECTED Final   Candida tropicalis NOT DETECTED NOT DETECTED Final   Cryptococcus neoformans/gattii NOT DETECTED NOT DETECTED Final    Comment: Performed at [facility], [address], [place], [place] [address]  Aerobic/Anaerobic Culture w Gram Stain (surgical/deep wound)     Status: None   Collection Time: 01/27/24  6:14 PM   Specimen: PATH Soft tissue resection  Result Value Ref Range Status   Specimen Description TISSUE  Final   Special Requests LEFT, 1ST RAY AMP  Final   Gram Stain   Final    NO WBC SEEN ABUNDANT GRAM POSITIVE COCCI MODERATE GRAM NEGATIVE RODS RARE GRAM POSITIVE RODS    Culture   Final    MODERATE ENTEROCOCCUS FAECALIS MODERATE STREPTOCOCCUS GROUP F Beta hemolytic streptococci are predictably susceptible to penicillin and other beta lactams. Susceptibility testing not routinely performed. FEW PREVOTELLA SPECIES BETA LACTAMASE POSITIVE Performed at [facility], [address] [place], [place] [address]    Report Status 01/30/2024 FINAL  Final   Organism ID, Bacteria ENTEROCOCCUS FAECALIS  Final      Susceptibility   Enterococcus faecalis - MIC*    AMPICILLIN <=2 SENSITIVE Sensitive     VANCOMYCIN 1 SENSITIVE Sensitive     GENTAMICIN SYNERGY SENSITIVE Sensitive     * MODERATE ENTEROCOCCUS FAECALIS  Aerobic/Anaerobic Culture w Gram Stain (surgical/deep wound)     Status: None   Collection Time: 01/27/24  6:15 PM   Specimen: PATH Bone resection; Tissue  Result Value Ref Range Status   Specimen Description BONE  Final   Special  Requests LEFT, 1ST RAY BONE  Final   Gram Stain   Final    NO WBC SEEN MODERATE GRAM POSITIVE COCCI MODERATE GRAM NEGATIVE RODS FEW GRAM POSITIVE RODS    Culture   Final    MODERATE ENTEROCOCCUS FAECALIS SUSCEPTIBILITIES PERFORMED ON PREVIOUS CULTURE WITHIN THE LAST 5 DAYS. MODERATE STREPTOCOCCUS GROUP F Beta hemolytic streptococci are predictably susceptible to penicillin and other beta lactams. Susceptibility testing not routinely performed. FEW PREVOTELLA SPECIES BETA LACTAMASE POSITIVE Performed at [facility], [address] [place], [place] [address]    Report Status 01/30/2024 FINAL  Final  Culture, blood (Routine X 2) w Reflex to ID Panel     Status: None (Preliminary result)   Collection Time: 01/29/24 10:55 AM   Specimen: BLOOD LEFT ARM  Result Value Ref Range Status   Specimen Description BLOOD LEFT ARM  Final  Special Requests   Final    BOTTLES DRAWN AEROBIC AND ANAEROBIC Blood Culture results may not be optimal due to an inadequate volume of blood received in culture bottles   Culture   Final    NO GROWTH 3 DAYS Performed at [facility], [address], [place], [place] [address]    Report Status PENDING  Incomplete  Culture, blood (Routine X 2) w Reflex to ID Panel     Status: None (Preliminary result)   Collection Time: 01/29/24 10:55 AM   Specimen: BLOOD LEFT ARM  Result Value Ref Range Status   Specimen Description BLOOD LEFT ARM  Final   Special Requests   Final    BOTTLES DRAWN AEROBIC AND ANAEROBIC Blood Culture results may not be optimal due to an inadequate volume of blood received in culture bottles   Culture   Final    NO GROWTH 3 DAYS Performed at [facility], [address] [place], [place] [address]    Report Status PENDING  Incomplete         Radiology Studies: ECHOCARDIOGRAM COMPLETE Result Date: 01/31/2024    ECHOCARDIOGRAM REPORT   Patient Name:   Hawk Linnell [facility] Date of Exam: 01/31/2024 Medical Rec #:  [id number]        Height:       73.0 in Accession #:    [id number]      Weight:       229.7 lb Date of Birth:  02-22-1976       BSA:          2.282 m Patient Age:    47 years        BP:           139/83 mmHg Patient Gender: M               HR:           105 bpm. Exam Location:  Inpatient Procedure: 2D Echo (Both Spectral and Color Flow Doppler were utilized during            procedure). Indications:    Bacteremia  History:        Patient has no prior history of Echocardiogram examinations.                 Risk Factors:Diabetes.  Sonographer:    Dione Franks RDCS Referring Phys: [id number] [facility] IMPRESSIONS  1. Left ventricular ejection fraction, by estimation, is 60 to 65%. The left ventricle has normal function. The left ventricle has no regional wall motion abnormalities. Left ventricular diastolic parameters are consistent with Grade I diastolic dysfunction (impaired relaxation).  2. Right ventricular systolic function is normal. The right ventricular size is normal.  3. The mitral valve is normal in structure. No evidence of mitral valve regurgitation. No evidence of mitral stenosis.  4. The aortic valve is normal in structure. Aortic valve regurgitation is not visualized. No aortic stenosis is present.  5. The inferior vena cava is normal in size with greater than 50% respiratory variability, suggesting right atrial pressure of 3 mmHg. Conclusion(s)/Recommendation(s): No evidence of valvular vegetations on this transthoracic echocardiogram. Consider a transesophageal echocardiogram to exclude infective endocarditis if clinically indicated. FINDINGS  Left Ventricle: Left ventricular ejection fraction, by estimation, is 60 to 65%. The left ventricle has normal function. The left ventricle has no regional wall motion abnormalities. The left ventricular internal cavity size was normal in size. There is  borderline left ventricular hypertrophy. Left  ventricular diastolic parameters are consistent with Grade I diastolic  dysfunction (impaired relaxation). Right Ventricle: The right ventricular size is normal. No increase in right ventricular wall thickness. Right ventricular systolic function is normal. Left Atrium: Left atrial size was normal in size. Right Atrium: Right atrial size was normal in size. Pericardium: There is no evidence of pericardial effusion. Mitral Valve: The mitral valve is normal in structure. No evidence of mitral valve regurgitation. No evidence of mitral valve stenosis. Tricuspid Valve: The tricuspid valve is normal in structure. Tricuspid valve regurgitation is not demonstrated. No evidence of tricuspid stenosis. Aortic Valve: The aortic valve is normal in structure. Aortic valve regurgitation is not visualized. No aortic stenosis is present. Pulmonic Valve: The pulmonic valve was normal in structure. Pulmonic valve regurgitation is not visualized. No evidence of pulmonic stenosis. Aorta: The aortic root is normal in size and structure. Venous: The inferior vena cava is normal in size with greater than 50% respiratory variability, suggesting right atrial pressure of 3 mmHg. IAS/Shunts: No atrial level shunt detected by color flow Doppler.  LEFT VENTRICLE PLAX 2D LVIDd:         4.80 cm   Diastology LVIDs:         2.80 cm   LV e' medial:    8.92 cm/s LV PW:         1.10 cm   LV E/e' medial:  10.0 LV IVS:        1.10 cm   LV e' lateral:   16.10 cm/s LVOT diam:     2.30 cm   LV E/e' lateral: 5.6 LV SV:         67 LV SV Index:   29 LVOT Area:     4.15 cm  RIGHT VENTRICLE             IVC RV Basal diam:  2.80 cm     IVC diam: 2.00 cm RV S prime:     20.80 cm/s TAPSE (M-mode): 2.4 cm LEFT ATRIUM           Index        RIGHT ATRIUM           Index LA diam:      3.90 cm 1.71 cm/m   RA Area:     12.80 cm LA Vol (A2C): 38.1 ml 16.69 ml/m  RA Volume:   29.40 ml  12.88 ml/m  AORTIC VALVE LVOT Vmax:   105.00 cm/s LVOT Vmean:  70.500 cm/s LVOT VTI:    0.162 m  AORTA Ao Root diam: 3.40 cm Ao Asc diam:  3.10 cm MITRAL  VALVE MV Area (PHT): 3.77 cm    SHUNTS MV Decel Time: 201 msec    Systemic VTI:  0.16 m MV E velocity: 89.60 cm/s  Systemic Diam: 2.30 cm MV A velocity: 95.60 cm/s MV E/A ratio:  0.94 Dorothye Gathers MD Electronically signed by Dorothye Gathers MD Signature Date/Time: 01/31/2024/5:17:07 PM    Final         Scheduled Meds:  acetaminophen   650 mg Rectal Once   apixaban  10 mg Oral BID   Followed by   Cecily Cohen ON 02/06/2024] apixaban  5 mg Oral BID   gabapentin  300 mg Oral TID   insulin aspart  0-15 Units Subcutaneous TID WC   insulin aspart  0-5 Units Subcutaneous QHS   insulin glargine-yfgn  10 Units Subcutaneous Q24H   linezolid  600 mg Oral Q12H   lisinopril  20  mg Oral Daily   mupirocin ointment   Topical Daily   pneumococcal 20-valent conjugate vaccine  0.5 mL Intramuscular Tomorrow-1000   polyethylene glycol  17 g Oral BID   senna-docusate  1 tablet Oral BID   Continuous Infusions:  ampicillin-sulbactam (UNASYN) IV 3 g (02/01/24 1708)     LOS: 5 days    Time spent: 40 minutes    Hilda Lovings, MD [facility]   To contact the attending provider between 7A-7P or the covering provider during after hours 7P-7A, please log into the web site [email or web address] and access using universal [place] password for that web site. If you do not have the password, please call the hospital operator.  02/01/2024, 7:47 PM

## 2024-02-01 NOTE — TOC Progression Note (Signed)
 Transition of Care [facility]) - Progression Note    Patient Details  Name: ARUN BAGGARLY MRN: [id number] Date of Birth: 1976/04/19  Transition [facility]) CM/SW Contact  Arron Big, [place] Phone Number: 02/01/2024, 4:03 PM  Clinical Narrative:   CSW spoke with patient, spouse, and patients mother at bedside about SNF options. Patient asked CSW about insurance coverage, wound vac, and current/pending bed offers. CSW explained that facility will do insurance auth and order a wound vac once he has chosen a facility to go to. CSW reached out to pending bed offers to inquire if they are in network with patients insurance. [facility] and [facility] are not. [facility] is but does not have bed availability until potentially next week. Patient had questions about HH. CSW notified RNCM.   CSW and RNCM clarified that patient will need regular wound vac when discharging to SNF.   Plan:  - SNF, awaiting choice from family. His wife is touring [place] and [address]   TOC will continue to follow.    Expected Discharge Plan: Skilled Nursing Facility Barriers to Discharge: Continued Medical Work up, SNF Pending bed offer, Other (must enter comment), Insurance Authorization (bed choice)  Expected Discharge Plan and Services In-house Referral: Clinical Social Work     Living arrangements for the past 2 months: Single Family Home                                       Social Determinants of Health (SDOH) Interventions SDOH Screenings   Food Insecurity: No Food Insecurity (01/27/2024)  Housing: Low Risk  (01/27/2024)  Transportation Needs: No Transportation Needs (01/27/2024)  Utilities: Not At Risk (01/27/2024)  Social Connections: Socially Integrated (01/27/2024)  Tobacco Use: Low Risk  (01/29/2024)    Readmission Risk Interventions     No data to display

## 2024-02-01 NOTE — Progress Notes (Signed)
 [facility]  Date of Admission:  01/27/2024   Total days of inpatient antibiotics 2  Principal Problem:   Toe osteomyelitis, left (HCC) Active Problems:   Gas gangrene of foot (HCC)   Gangrene of toe of left foot (HCC)   Controlled type 2 diabetes mellitus without complication, without long-term current use of insulin (HCC)   Hypercalcemia   Constipation   Hypertension   Acute deep vein thrombosis (DVT) of left lower extremity (HCC)   Type 2 diabetes mellitus without complication, without long-term current use of insulin [facility])          Assessment: 48 year old male with left foot wound which started blister parotic in March followed by podiatry, referred to wound care presented with left foot infection found to have: #Strep constellatus bacteremia secondary to left foot gas gangrene/osteomyelitis - CT of left foot showed necrotizing fasciitis of great toe, emphysema osteomyelitis or osteonecrosis -Patient febrile on admission.  Blood cultures grew strep constellatus.  He underwent first ray amputation second toe amputation at [facility] with podiatry.  Or cultures growing E faecalis and obstructive. -5/3 Or cultures grew E faecalis pansensitive, group f strep - Taken to the OR again on 5/5 for repeat I&D and second metatarsal resection, no cultures.  Patient has wound VAC on at this point.   #Diabetes mellitus poorly controlled - A1c 9.8 on 01/27/2024 - Needs glycemic control for optimal wound healing  Recommendations: -Unasyn -Follow surgical plan -TTE  no veg, will get tee given strep bacteremia. Cards paged - Follow repeat blood cultures to ensure clearance. -Standard precautions  Evaluation of this patient requires complex antimicrobial therapy evaluation and counseling + isolation needs for disease transmission risk assessment and mitigation   Microbiology:   Antibiotics: Unasyn 5/2, present 5/3 linezolid-present Metronidazole 5/4 Pip-tazo  5/3 Vancomycin 5/2 Cultures: Blood 5/22/2 sets growing strep constellatus Urine   Other 5/3 wound cultures growing E faecalis in group of stress   SUBJECTIVE: Resting in bed . No new complaints Interval: Afebrile overnight, wbc 15k  Review of Systems: Review of Systems  All other systems reviewed and are negative.    Scheduled Meds:  acetaminophen   650 mg Rectal Once   apixaban  10 mg Oral BID   Followed by   Cecily Cohen ON 02/06/2024] apixaban  5 mg Oral BID   gabapentin  300 mg Oral TID   insulin aspart  0-15 Units Subcutaneous TID WC   insulin aspart  0-5 Units Subcutaneous QHS   insulin glargine-yfgn  10 Units Subcutaneous Q24H   insulin starter kit- pen needles  1 kit Other Once   linezolid  600 mg Oral Q12H   lisinopril  20 mg Oral Daily   mupirocin ointment   Topical Daily   pneumococcal 20-valent conjugate vaccine  0.5 mL Intramuscular Tomorrow-1000   polyethylene glycol  17 g Oral BID   senna-docusate  1 tablet Oral BID   Continuous Infusions:  ampicillin-sulbactam (UNASYN) IV 3 g (02/01/24 1145)   PRN Meds:.acetaminophen  **OR** acetaminophen , mouth rinse, oxyCODONE -acetaminophen , polyethylene glycol, prochlorperazine  No Known Allergies  OBJECTIVE: Vitals:   02/01/24 0057 02/01/24 0536 02/01/24 0700 02/01/24 1100  BP: 128/72 135/88 130/74 122/69  Pulse: 97  96 (!) 101  Resp:  16 14 14   Temp: 98.7 F (37.1 C) 98.9 F (37.2 C) (!) 97.4 F (36.3 C) (!) 97.4 F (36.3 C)  TempSrc: Oral Oral Axillary Axillary  SpO2: 95% 94%  94%  Weight:  Height:       Body mass index is 30.31 kg/m.  Physical Exam Constitutional:      General: He is not in acute distress.    Appearance: He is normal weight. He is not toxic-appearing.  HENT:     Head: Normocephalic and atraumatic.     Right Ear: External ear normal.     Left Ear: External ear normal.     Nose: No congestion or rhinorrhea.     Mouth/Throat:     Mouth: Mucous membranes are moist.     Pharynx:  Oropharynx is clear.  Eyes:     Extraocular Movements: Extraocular movements intact.     Conjunctiva/sclera: Conjunctivae normal.     Pupils: Pupils are equal, round, and reactive to light.  Cardiovascular:     Rate and Rhythm: Normal rate and regular rhythm.     Heart sounds: No murmur heard.    No friction rub. No gallop.  Pulmonary:     Effort: Pulmonary effort is normal.     Breath sounds: Normal breath sounds.  Abdominal:     General: Abdomen is flat. Bowel sounds are normal.     Palpations: Abdomen is soft.  Musculoskeletal:        General: No swelling.     Cervical back: Normal range of motion and neck supple.     Comments: Wound VAC  Skin:    General: Skin is warm and dry.  Neurological:     General: No focal deficit present.     Mental Status: He is oriented to person, place, and time.  Psychiatric:        Mood and Affect: Mood normal.       Lab Results Lab Results  Component Value Date   WBC 15.0 (H) 02/01/2024   HGB 12.0 (L) 02/01/2024   HCT 36.9 (L) 02/01/2024   MCV 89.3 02/01/2024   PLT 443 (H) 02/01/2024    Lab Results  Component Value Date   CREATININE 0.76 02/01/2024   BUN 9 02/01/2024   NA 134 (L) 02/01/2024   K 4.1 02/01/2024   CL 97 (L) 02/01/2024   CO2 24 02/01/2024    Lab Results  Component Value Date   ALT 23 01/28/2024   AST 25 01/28/2024   ALKPHOS 94 01/28/2024   BILITOT 2.3 (H) 01/28/2024        [name], MD [facility] 02/01/2024, 2:44 PM

## 2024-02-01 NOTE — Telephone Encounter (Signed)
 Pharmacy Patient Advocate Encounter   Received notification from Inpatient Request that prior authorization for Dexcom G7 Sensor is required/requested.   Insurance verification completed.   The patient is insured through [employer] [facility] .   Per test claim: PA required; PA submitted to above mentioned insurance via CoverMyMeds Key/confirmation #/EOC [id number] Status is pending

## 2024-02-01 NOTE — Plan of Care (Signed)
   Problem: Education: Goal: Knowledge of General Education information will improve Description: Including pain rating scale, medication(s)/side effects and non-pharmacologic comfort measures Outcome: Progressing   Problem: Safety: Goal: Ability to remain free from injury will improve Outcome: Progressing

## 2024-02-01 NOTE — Telephone Encounter (Signed)
 Pharmacy Patient Advocate Encounter  Received notification from [employer] [facility] that Prior Authorization for Dexcom G7 Sensor  has been DENIED.  No reason given; No denial letter received via Fax or CMM. It has been requested and will be uploaded to the media tab once received.

## 2024-02-01 NOTE — Plan of Care (Signed)

## 2024-02-01 NOTE — H&P (View-Only) (Signed)
   [facility] has been requested to perform a transesophageal echocardiogram on Earnest Goad for bacteremia.  After careful review of history and examination, the risks and benefits of transesophageal echocardiogram have been explained including risks of esophageal damage, perforation (1:10,000 risk), bleeding, pharyngeal hematoma as well as other potential complications associated with conscious sedation including aspiration, arrhythmia, respiratory failure and death. Alternatives to treatment were discussed, questions were answered. Patient is willing to proceed.   Plan for tomorrow.   Burnetta Cart, PA-C  02/01/2024 3:08 PM

## 2024-02-01 NOTE — Progress Notes (Signed)
 PT Cancellation Note  Patient Details Name: Dakota Becker MRN: [id number] DOB: 1976-04-27   Cancelled Treatment:    Reason Eval/Treat Not Completed: Other (comment) (Per SW note pt had questions about HH. PT entered room with step to discuss d/c disposition given his home set up and family support. Pt declined education on stair training as he and his wife had just decided on post-acute rehab. She was currently out touring 2 facilities. Pt c/o LLE pain including throbbing and shooting, which he believes gabapentin is not alleviating - RN notified. Will follow-up tomorrow to advance gait when +2 assist is available to provide a chair follow.)  Glenford Lanes, PT, DPT Acute Rehabilitation Services Office: [phone] Secure Chat Preferred  Riva Chester 02/01/2024, 4:43 PM

## 2024-02-02 ENCOUNTER — Inpatient Hospital Stay (HOSPITAL_COMMUNITY): Admitting: Anesthesiology

## 2024-02-02 ENCOUNTER — Encounter (HOSPITAL_COMMUNITY)
Admission: EM | Disposition: A | Discharge status: Home / Self Care | Payer: Self-pay | Source: Ambulatory Visit | Attending: Internal Medicine

## 2024-02-02 ENCOUNTER — Inpatient Hospital Stay (HOSPITAL_COMMUNITY)

## 2024-02-02 ENCOUNTER — Encounter (HOSPITAL_COMMUNITY): Payer: Self-pay | Admitting: Internal Medicine

## 2024-02-02 DIAGNOSIS — I10 Essential (primary) hypertension: Secondary | ICD-10-CM | POA: Diagnosis not present

## 2024-02-02 DIAGNOSIS — R7881 Bacteremia: Secondary | ICD-10-CM | POA: Diagnosis not present

## 2024-02-02 DIAGNOSIS — B9689 Other specified bacterial agents as the cause of diseases classified elsewhere: Secondary | ICD-10-CM | POA: Diagnosis not present

## 2024-02-02 DIAGNOSIS — E119 Type 2 diabetes mellitus without complications: Secondary | ICD-10-CM | POA: Diagnosis not present

## 2024-02-02 DIAGNOSIS — I38 Endocarditis, valve unspecified: Secondary | ICD-10-CM

## 2024-02-02 DIAGNOSIS — E1169 Type 2 diabetes mellitus with other specified complication: Secondary | ICD-10-CM | POA: Diagnosis not present

## 2024-02-02 DIAGNOSIS — Z9889 Other specified postprocedural states: Secondary | ICD-10-CM

## 2024-02-02 DIAGNOSIS — A480 Gas gangrene: Secondary | ICD-10-CM | POA: Diagnosis not present

## 2024-02-02 DIAGNOSIS — M869 Osteomyelitis, unspecified: Secondary | ICD-10-CM | POA: Diagnosis not present

## 2024-02-02 DIAGNOSIS — E1152 Type 2 diabetes mellitus with diabetic peripheral angiopathy with gangrene: Secondary | ICD-10-CM | POA: Diagnosis not present

## 2024-02-02 DIAGNOSIS — M86172 Other acute osteomyelitis, left ankle and foot: Secondary | ICD-10-CM | POA: Diagnosis not present

## 2024-02-02 HISTORY — PX: TRANSESOPHAGEAL ECHOCARDIOGRAM (CATH LAB): EP1270

## 2024-02-02 LAB — ECHO TEE

## 2024-02-02 LAB — BASIC METABOLIC PANEL WITH GFR
Anion gap: 11 (ref 5–15)
BUN: 9 mg/dL (ref 6–20)
CO2: 26 mmol/L (ref 22–32)
Calcium: 8.8 mg/dL — ABNORMAL LOW (ref 8.9–10.3)
Chloride: 97 mmol/L — ABNORMAL LOW (ref 98–111)
Creatinine, Ser: 0.7 mg/dL (ref 0.61–1.24)
GFR, Estimated: >60 mL/min (ref >60)
Glucose, Bld: 255 mg/dL — ABNORMAL HIGH (ref 70–99)
Potassium: 3.8 mmol/L (ref 3.5–5.1)
Sodium: 134 mmol/L — ABNORMAL LOW (ref 135–145)

## 2024-02-02 LAB — CBC WITH DIFFERENTIAL/PLATELET
Abs Immature Granulocytes: 0.11 10*3/uL — ABNORMAL HIGH (ref 0.00–0.07)
Basophils Absolute: 0 10*3/uL (ref 0.0–0.1)
Basophils Relative: 0 %
Eosinophils Absolute: 0.2 10*3/uL (ref 0.0–0.5)
Eosinophils Relative: 2 %
HCT: 35.3 % — ABNORMAL LOW (ref 39.0–52.0)
Hemoglobin: 11.4 g/dL — ABNORMAL LOW (ref 13.0–17.0)
Immature Granulocytes: 1 %
Lymphocytes Relative: 13 %
Lymphs Abs: 1.6 10*3/uL (ref 0.7–4.0)
MCH: 28.7 pg (ref 26.0–34.0)
MCHC: 32.3 g/dL (ref 30.0–36.0)
MCV: 88.9 fL (ref 80.0–100.0)
Monocytes Absolute: 0.9 10*3/uL (ref 0.1–1.0)
Monocytes Relative: 7 %
Neutro Abs: 9.2 10*3/uL — ABNORMAL HIGH (ref 1.7–7.7)
Neutrophils Relative %: 77 %
Platelets: 447 10*3/uL — ABNORMAL HIGH (ref 150–400)
RBC: 3.97 MIL/uL — ABNORMAL LOW (ref 4.22–5.81)
RDW: 13.3 % (ref 11.5–15.5)
WBC: 12 10*3/uL — ABNORMAL HIGH (ref 4.0–10.5)
nRBC: 0 % (ref 0.0–0.2)

## 2024-02-02 LAB — GLUCOSE, CAPILLARY
Glucose-Capillary: 281 mg/dL — ABNORMAL HIGH (ref 70–99)
Glucose-Capillary: 251 mg/dL — ABNORMAL HIGH (ref 70–99)
Glucose-Capillary: 193 mg/dL — ABNORMAL HIGH (ref 70–99)
Glucose-Capillary: 292 mg/dL — ABNORMAL HIGH (ref 70–99)

## 2024-02-02 LAB — MAGNESIUM: Magnesium: 1.9 mg/dL (ref 1.7–2.4)

## 2024-02-02 SURGERY — TRANSESOPHAGEAL ECHOCARDIOGRAM (TEE) (CATHLAB)
Anesthesia: Monitor Anesthesia Care

## 2024-02-02 MED ORDER — SODIUM CHLORIDE 0.9 % IV SOLN: INTRAVENOUS | Status: DC

## 2024-02-02 MED ORDER — LIDOCAINE 2% (20 MG/ML) 5 ML SYRINGE
INTRAMUSCULAR | Status: DC | PRN
  Administered 2024-02-02: 20 mg via INTRAVENOUS

## 2024-02-02 MED ORDER — INSULIN ASPART 100 UNIT/ML IJ SOLN
INTRAMUSCULAR | Status: AC
  Filled 2024-02-02: qty 1

## 2024-02-02 MED ORDER — PROPOFOL 10 MG/ML IV BOLUS
INTRAVENOUS | Status: DC | PRN
  Administered 2024-02-02: 80 mg via INTRAVENOUS

## 2024-02-02 MED ORDER — GLYCOPYRROLATE PF 0.2 MG/ML IJ SOSY
PREFILLED_SYRINGE | INTRAMUSCULAR | Status: DC | PRN
Start: 2024-02-02 — End: 2024-02-02
  Administered 2024-02-02: .1 mg via INTRAVENOUS

## 2024-02-02 MED ORDER — PROPOFOL 500 MG/50ML IV EMUL
INTRAVENOUS | Status: DC | PRN
  Administered 2024-02-02: 150 ug/kg/min via INTRAVENOUS

## 2024-02-02 MED ORDER — INSULIN GLARGINE-YFGN 100 UNIT/ML ~~LOC~~ SOLN
15.0000 [IU] | SUBCUTANEOUS | Status: DC
  Administered 2024-02-02: 15 [IU] via SUBCUTANEOUS
  Filled 2024-02-02 (×2): qty 0.15

## 2024-02-02 MED ORDER — AMOXICILLIN-POT CLAVULANATE 875-125 MG PO TABS
1.0000 | ORAL_TABLET | Freq: Two times a day (BID) | ORAL | Status: DC
  Administered 2024-02-02 – 2024-02-08 (×12): 1 via ORAL
  Filled 2024-02-02 (×13): qty 1

## 2024-02-02 MED ORDER — LIDOCAINE HCL 4 % EX SOLN
CUTANEOUS | Status: DC | PRN
  Administered 2024-02-02: 3 mL via TOPICAL

## 2024-02-02 NOTE — Progress Notes (Signed)
 PODIATRY PROGRESS NOTE Patient Name: Dakota Becker  DOB 03-04-1976 DOA 01/27/2024  Hospital Day: 7  Assessment:  48 y.o. 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 Partial first ray amputation of left foot 2. 2nd toe amputation at [facility] level of left foot 3. Application dissolvable antibiotic beads left foot  DOS: 01/29/24 1.  Repeat irrigation and debridement partial second metatarsal resection, left foot 2.  Dermal allograft application 5 x 4 cm, left foot 3.  Application negative pressure wound therapy 5 x 4 cm, left foot      AF WBC 12  Path: A. FOOT, PARTIAL LEFT FIRST RAY AND 2ND TOE, AMPUTATION:  - Benign bone with mild acute inflammation and bacterial colonies  - Margin shows acute inflammation and necrosis, and appears partially viable  - No malignancy identified   Cultures:   MODERATE ENTEROCOCCUS FAECALIS MODERATE STREPTOCOCCUS GROUP F FEW PREVOTELLA SPECIES    CT L foot: Soft tissue emphysema of 1st ray, intraosseous gas in phalanges of great toe, osteomyelitis great toe  Plan:  - S/p Repeat debridement with partial 2nd met resection, graft application partial closure of surgical site.  - Overall continuing to progress postop, surgical site appears to be improving with no evidence of residual uncontrolled infection.  - Continue to trend leukocytosis, slowly improving - ABX per ID recommendations appreciate - Anticoagulation: On Eliquis  due to DVT, continue - Wound care: Appreciate WOCN in assitance with vac change today. 2 x weekly vac changes - will need to be continued at SNF. Next change Monday. Will need home vac after SNF.  - WB status: Ok to transition to heel WB in post op shoe for short distance transfers - Pt to follow up in 2 weeks in office, will have them arrange. Will sign off please msg with questions. If still here  mid next week will follow up in hospital         Maridee Shoemaker, DPM [facility]    Subjective:  Discussed plan for continued vac therapy vs converting to TMA to try and close wound. Both have risks of needing additional surgery. For now he agrees with proceeding with vac therapy which I think is still decent chance of being successful.   Objective:   Vitals:   02/02/24 0532 02/02/24 0744  BP: 138/81 137/82  Pulse: 93 96  Resp: 20 18  Temp: 98.7 F (37.1 C) 99.1 F (37.3 C)  SpO2: 93% 95%       Latest Ref Rng & Units 02/02/2024    2:44 AM 02/01/2024    2:50 AM 01/31/2024    2:41 AM  CBC  WBC 4.0 - 10.5 K/uL 12.0  15.0  15.9   Hemoglobin 13.0 - 17.0 g/dL 40.9  81.1  91.4   Hematocrit 39.0 - 52.0 % 35.3  36.9  36.4   Platelets 150 - 400 K/uL 447  443  423        Latest Ref Rng & Units 02/02/2024    2:44 AM 02/01/2024    2:50 AM 01/31/2024    2:41 AM  BMP  Glucose 70 - 99 mg/dL 782  956  213   BUN 6 - 20 mg/dL 9  9  9    Creatinine 0.61 - 1.24 mg/dL 0.86  5.78  4.69   Sodium 135 - 145 mmol/L  134  134  135   Potassium 3.5 - 5.1 mmol/L 3.8  4.1  4.1   Chloride 98 - 111 mmol/L 97  97  98   CO2 22 - 32 mmol/L 26  24  23    Calcium 8.9 - 10.3 mg/dL 8.8  9.2  9.1     General: AAOx3, NAD  Lower Extremity Exam S/p first and 2nd ray amp Left foot  Decreasing edema left foot/ankle  Integra silicone layer intact, mild infill of tissue undelrying, no evidence of infection under silicone  Distal closed part of amp site healing well   Pain with compression of left calf Able to DF/PF left ankle     Radiology:  Results reviewed. See assessment for pertinent imaging results

## 2024-02-02 NOTE — Progress Notes (Signed)
 PT Cancellation Note  Patient Details Name: Dakota Becker MRN: [id number] DOB: Mar 05, 1976   Cancelled Treatment:    Reason Eval/Treat Not Completed: Other (comment) (Per RN, Wound Care nurse present on floor about to change pt's dressing and then transport will be arriving shortly to take pt for TEE. Will follow-up this afternoon for PT session.)  Glenford Lanes, PT, DPT Acute Rehabilitation Services Office: [phone] Secure Chat Preferred  Riva Chester 02/02/2024, 9:57 AM

## 2024-02-02 NOTE — CV Procedure (Signed)
 Brief TEE Note  LVEF 60-65% No LA/LAA thrombus or masses No evidence of endocarditis.   For additional details see full report.   Dakota Becker C. Theodis Fiscal, MD, [facility] 02/02/2024 12:39 PM

## 2024-02-02 NOTE — Anesthesia Preprocedure Evaluation (Addendum)
 Anesthesia Evaluation  Patient identified by MRN, date of birth, ID band Patient awake    Reviewed: Allergy & Precautions, NPO status , Patient's Chart, lab work & pertinent test results  Airway Mallampati: II  TM Distance: >3 FB Neck ROM: Full    Dental no notable dental hx.    Pulmonary neg pulmonary ROS   Pulmonary exam normal        Cardiovascular hypertension, Pt. on medications Normal cardiovascular exam     Neuro/Psych negative neurological ROS  negative psych ROS   GI/Hepatic negative GI ROS, Neg liver ROS,,,  Endo/Other  diabetes, Oral Hypoglycemic Agents  Patient on GLP-1 Agonist  Renal/GU negative Renal ROS     Musculoskeletal negative musculoskeletal ROS (+)    Abdominal  (+) + obese  Peds  Hematology  (+) Blood dyscrasia, anemia   Anesthesia Other Findings bacteremia  Reproductive/Obstetrics                             Anesthesia Physical Anesthesia Plan  ASA: 3  Anesthesia Plan: MAC   Post-op Pain Management:    Induction:   PONV Risk Score and Plan: 1 and Propofol  infusion and Treatment may vary due to age or medical condition  Airway Management Planned: Nasal Cannula  Additional Equipment:   Intra-op Plan:   Post-operative Plan:   Informed Consent: I have reviewed the patients History and Physical, chart, labs and discussed the procedure including the risks, benefits and alternatives for the proposed anesthesia with the patient or authorized representative who has indicated his/her understanding and acceptance.     Dental advisory given  Plan Discussed with: CRNA  Anesthesia Plan Comments:        Anesthesia Quick Evaluation

## 2024-02-02 NOTE — Progress Notes (Signed)
 PROGRESS NOTE    Dakota Becker  ZOX:[id number] DOB: July 15, 1976 DOA: 01/27/2024 PCP: Alejandro Hurt, FNP    Chief Complaint  Patient presents with   Foot Pain   Wound Infection   Code Sepsis    Brief Narrative:   Dakota Becker is a 48 y.o. male with medical history significant of diabetes, essential hypertension, hyperlipidemia, with great toe ulcer developed some blisters.  Not taking his diabetic medication.  He went to PCP and was put on oral antibiotics and referred to podiatry.  Recommended wound management but he never got contacted so started having increasing swelling in the left toe with pain for 5 days or so with some chills and nausea ,vomiting and foul odor.  Patient then presented to [facility].  Upon arrival to the ED podiatry was consulted.  Patient  met sepsis criteria secondary to osteomyelitis of the left great toe.  Patient was then taken to the OR and underwent left hallux amputation.  Patient underwent repeat debridement on 01/29/2024 by podiatry and ID  has been consulted for antibiotic management..    Assessment & Plan:   Principal Problem:   Toe osteomyelitis, left (HCC) Active Problems:   Gas gangrene of foot (HCC)   Gangrene of toe of left foot (HCC)   Controlled type 2 diabetes mellitus without complication, without long-term current use of insulin  (HCC)   Hypercalcemia   Constipation   Hypertension   Acute deep vein thrombosis (DVT) of left lower extremity (HCC)   Type 2 diabetes mellitus without complication, without long-term current use of insulin  (HCC)   Bacteremia  #1 sepsis secondary to gas gangrene of the left great toe with osteomyelitis/Streptococcus bacteremia -Patient seen in consultation by podiatry and underwent surgical intervention on admission with partial first ray amputation of the left foot and second toe amputation at MPJ level of the left foot with application of dissolvable antibiotic beads of the left foot. -  Patient followed by podiatry underwent repeat irrigation and debridement with allograft application with wound VAC treatment on 01/29/2024. - Per podiatry patient high risk for need for revision including transmetatarsal amputation. - ID consulted and patient currently on linezolid  and Unasyn . - NWB per podiatry. - Blood cultures from 01/27/2024 with Streptococcus constellatus with repeat blood cultures from 01/29/2024 with no growth to date. - Wound cultures with Enterococcus and Streptococcus noted. - Patient with a leukocytosis which has started to trend down. - Fever curve has trended down. - 2D echo with a EF of 60 to 65%, NWMA, grade 1 diastolic dysfunction, no vegetations noted. -ID recommending TEE which was done this morning to 02/02/2024 with a EF of 60 to 65%, no LA/LAA thrombus or masses noted, no evidence of endocarditis.  - Continue Unasyn , linezolid . - ID and podiatry following and I appreciate the input and recommendations.  2.  Left lower extremity DVT -Patient started on Eliquis  for anticoagulation as no further surgical intervention planned at this time.  3.  Nausea/decreased appetite - Resolved.  4.  Constipation - Patient placed on MiraLAX  twice daily, Senokot-S twice daily and received sorbitol  x 2 doses as well as Dulcolax suppository with good results.   - Patient having ongoing bowel movements.   - Continue MiraLAX  daily, Senokot-S daily. - Follow.  5.  Diabetes mellitus type 2 -Hemoglobin A1c 9.8. - CBG 281 this morning. - Patient noted to be on glipizide, Jardiance, semaglutide prior to admission. - Increase Semglee  to 15 units daily.   - SSI.  -  Diabetes coordinator following.  6.  Hypertension - BP stable.   - Continue lisinopril  20 mg daily.   7.  Class I obesity -BMI of 32.84 kg/m. -Lifestyle modification - Outpatient follow-up with PCP.  8.  Hypercalcemia -Resolved.   DVT prophylaxis: Eliquis  Code Status: Full Family Communication: Updated  patient.  No family at bedside. Disposition: SNF  Status is: Inpatient Remains inpatient appropriate because: Severity of illness   Consultants:  Podiatry: Dr. Rosemarie Conquest 01/27/2024 ID: Dr. Zelda Hickman 01/29/2024 Wound care RN  Procedures: 2D echo 01/31/2024 CT left ankle and left foot 01/27/2024 Plain films of the left foot 01/27/2024, 01/29/2024 Lower extremity Dopplers 01/30/2024 Lower extremity arterial duplex 01/30/2024 Partial first ray amputation left foot/second toe amputation at MPJ level of the left foot/application of dissolvable antibiotic beads left foot per podiatry: Dr. Demetria Finch 01/27/2024 Repeat irrigation and debridement partial second metatarsal resection, left foot/dermal allograft application 5 x 4 cm left foot/application negative pressure wound therapy 5/4 cm, left foot per podiatry: Dr. Rosemarie Conquest 01/29/2024 TEE 02/02/2024  Antimicrobials: Anti-infectives (From admission, onward)    Start     Dose/Rate Route Frequency Ordered Stop   02/02/24 2200  amoxicillin -clavulanate (AUGMENTIN ) 875-125 MG per tablet 1 tablet        1 tablet Oral Every 12 hours 02/02/24 1533 03/12/24 0959   01/30/24 1200  Ampicillin -Sulbactam (UNASYN ) 3 g in sodium chloride  0.9 % 100 mL IVPB  Status:  Discontinued        3 g 200 mL/hr over 30 Minutes Intravenous Every 6 hours 01/30/24 1103 02/02/24 1533   01/29/24 2200  linezolid  (ZYVOX ) tablet 600 mg  Status:  Discontinued        600 mg Oral Every 12 hours 01/29/24 1040 02/02/24 1530   01/29/24 1130  cefTRIAXone  (ROCEPHIN ) 2 g in sodium chloride  0.9 % 100 mL IVPB  Status:  Discontinued        2 g 200 mL/hr over 30 Minutes Intravenous Every 24 hours 01/29/24 1036 01/30/24 1103   01/28/24 1130  piperacillin -tazobactam (ZOSYN ) IVPB 3.375 g  Status:  Discontinued        3.375 g 12.5 mL/hr over 240 Minutes Intravenous Every 8 hours 01/28/24 1035 01/29/24 1036   01/28/24 1000  metroNIDAZOLE  (FLAGYL ) IVPB 500 mg  Status:  Discontinued        500 mg 100 mL/hr over 60  Minutes Intravenous Every 12 hours 01/28/24 0925 01/28/24 1035   01/27/24 2200  linezolid  (ZYVOX ) IVPB 600 mg  Status:  Discontinued        600 mg 300 mL/hr over 60 Minutes Intravenous Every 12 hours 01/27/24 1953 01/27/24 2007   01/27/24 1835  vancomycin  (VANCOCIN ) powder  Status:  Discontinued          As needed 01/27/24 1835 01/27/24 1843   01/27/24 1834  tobramycin  (NEBCIN ) injection  Status:  Discontinued          As needed 01/27/24 1835 01/27/24 1843   01/27/24 1515  linezolid  (ZYVOX ) IVPB 600 mg  Status:  Discontinued        600 mg 300 mL/hr over 60 Minutes Intravenous Every 12 hours 01/27/24 1502 01/29/24 1036   01/27/24 1515  piperacillin -tazobactam (ZOSYN ) IVPB 3.375 g        3.375 g 100 mL/hr over 30 Minutes Intravenous  Once 01/27/24 1505 01/27/24 1635   01/27/24 1430  vancomycin  (VANCOCIN ) IVPB 1000 mg/200 mL premix  Status:  Discontinued       Placed in "Followed by" Linked Group  1,000 mg 200 mL/hr over 60 Minutes Intravenous  Once 01/27/24 1419 01/27/24 1502   01/27/24 1430  vancomycin  (VANCOCIN ) IVPB 1000 mg/200 mL premix  Status:  Discontinued       Placed in "Followed by" Linked Group   1,000 mg 200 mL/hr over 60 Minutes Intravenous  Once 01/27/24 1419 01/27/24 1502   01/27/24 1415  Ampicillin -Sulbactam (UNASYN ) 3 g in sodium chloride  0.9 % 100 mL IVPB  Status:  Discontinued       Placed in "And" Linked Group   3 g 200 mL/hr over 30 Minutes Intravenous  Once 01/27/24 1412 01/27/24 1635   01/27/24 1415  vancomycin  (VANCOCIN ) IVPB 1000 mg/200 mL premix  Status:  Discontinued       Placed in "And" Linked Group   1,000 mg 200 mL/hr over 60 Minutes Intravenous  Once 01/27/24 1412 01/27/24 1419         Subjective: Patient laying in post cath care unit after undergoing TEE this morning.  Patient denies any chest pain, no shortness of breath.  Still having bowel movements.  States podiatrist changed the dressing on his wound and wound VAC placed back on and told at  this time did not need any further debridements.    Objective: Vitals:   02/02/24 1300 02/02/24 1310 02/02/24 1329 02/02/24 1546  BP: 139/79 (!) 144/76  (!) 149/83  Pulse: 97 95  95  Resp: 12 18  20   Temp:   98.6 F (37 C) 98.8 F (37.1 C)  TempSrc:   Oral Oral  SpO2: 97% 97%  100%  Weight:      Height:        Intake/Output Summary (Last 24 hours) at 02/02/2024 1821 Last data filed at 02/02/2024 1734 Gross per 24 hour  Intake 965.56 ml  Output 700 ml  Net 265.56 ml   Filed Weights   01/30/24 0421 01/31/24 0425 02/02/24 0532  Weight: 112.9 kg 104.2 kg 103.8 kg    Examination:  General exam: NAD Respiratory system: Lungs clear to auscultation bilaterally anterior lung fields.  No wheezes, no crackles, no rhonchi.  Fair air movement.  Speaking in full sentences.  Cardiovascular system: RRR no murmurs rubs or gallops.  No JVD.  No lower extremity edema.   Gastrointestinal system: Abdomen is soft, nontender, obese, nondistended, positive bowel sounds.  No rebound.  No guarding.   Central nervous system: Alert and oriented. No focal neurological deficits. Extremities: Left foot in postop bandage with wound VAC in place.  Skin: No rashes, lesions or ulcers Psychiatry: Judgement and insight appear normal. Mood & affect appropriate.     Data Reviewed: I have personally reviewed following labs and imaging studies  CBC: Recent Labs  Lab 01/27/24 1450 01/28/24 0351 01/29/24 0252 01/30/24 0357 01/31/24 0241 02/01/24 0250 02/02/24 0244  WBC 17.6*   < > 18.5* 19.0* 15.9* 15.0* 12.0*  NEUTROABS 16.4*  --   --   --   --  11.7* 9.2*  HGB 14.3   < > 12.3* 12.6* 11.9* 12.0* 11.4*  HCT 42.2   < > 37.6* 38.0* 36.4* 36.9* 35.3*  MCV 87.9   < > 88.7 88.4 88.3 89.3 88.9  PLT 468*   < > 399 405* 423* 443* 447*   < > = values in this interval not displayed.    Basic Metabolic Panel: Recent Labs  Lab 01/29/24 0252 01/30/24 0357 01/31/24 0241 02/01/24 0250 02/02/24 0244  NA 133*  134* 135 134* 134*  K 4.0  4.1 4.1 4.1 3.8  CL 99 99 98 97* 97*  CO2 21* 20* 23 24 26   GLUCOSE 174* 204* 206* 229* 255*  BUN 9 7 9 9 9   CREATININE 0.91 0.89 0.84 0.76 0.70  CALCIUM 9.1 9.1 9.1 9.2 8.8*  MG 1.8  --  2.0 2.0 1.9  PHOS  --  3.3  --   --   --     GFR: Estimated Creatinine Clearance: 141.4 mL/min (by C-G formula based on SCr of 0.7 mg/dL).  Liver Function Tests: Recent Labs  Lab 01/27/24 1450 01/28/24 0351  AST 34 25  ALT 18 23  ALKPHOS 191* 94  BILITOT 1.0 2.3*  PROT 8.0 6.8  ALBUMIN 3.8 2.2*    CBG: Recent Labs  Lab 02/01/24 1554 02/01/24 2048 02/02/24 0633 02/02/24 1116 02/02/24 1543  GLUCAP 263* 265* 281* 251* 193*     Recent Results (from the past 240 hours)  Blood Cultures x 2 sites     Status: Abnormal   Collection Time: 01/27/24  2:55 PM   Specimen: BLOOD LEFT FOREARM  Result Value Ref Range Status   Specimen Description BLOOD LEFT FOREARM  Final   Special Requests   Final    Blood Culture adequate volume BOTTLES DRAWN AEROBIC AND ANAEROBIC   Culture  Setup Time   Final    GRAM POSITIVE COCCI IN BOTH AEROBIC AND ANAEROBIC BOTTLES CRITICAL VALUE NOTED.  VALUE IS CONSISTENT WITH PREVIOUSLY REPORTED AND CALLED VALUE.    Culture (A)  Final    STREPTOCOCCUS CONSTELLATUS SUSCEPTIBILITIES PERFORMED ON PREVIOUS CULTURE WITHIN THE LAST 5 DAYS. Performed at [facility], [address] [place], [place] [address]    Report Status 01/30/2024 FINAL  Final  Blood Cultures x 2 sites     Status: Abnormal   Collection Time: 01/27/24  2:56 PM   Specimen: BLOOD  Result Value Ref Range Status   Specimen Description   Final    BLOOD RIGHT ANTECUBITAL Performed at [facility], [address] [place], [place] [address]    Special Requests   Final    Blood Culture adequate volume BOTTLES DRAWN AEROBIC AND ANAEROBIC   Culture  Setup Time   Final    GRAM POSITIVE COCCI IN CHAINS IN BOTH AEROBIC AND ANAEROBIC  BOTTLES CRITICAL RESULT CALLED TO, READ BACK BY AND VERIFIED WITH: PHARMD Normie Becton [id number] AT 1956, ADC Performed at [facility], [address] [place], [place] [address]    Culture STREPTOCOCCUS CONSTELLATUS (A)  Final   Report Status 01/30/2024 FINAL  Final   Organism ID, Bacteria STREPTOCOCCUS CONSTELLATUS  Final      Susceptibility   Streptococcus constellatus - MIC*    PENICILLIN <=0.06 SENSITIVE Sensitive     CEFTRIAXONE  0.25 SENSITIVE Sensitive     ERYTHROMYCIN <=0.12 SENSITIVE Sensitive     LEVOFLOXACIN 0.5 SENSITIVE Sensitive     VANCOMYCIN  0.5 SENSITIVE Sensitive     * STREPTOCOCCUS CONSTELLATUS  Blood Culture ID Panel (Reflexed)     Status: Abnormal   Collection Time: 01/27/24  2:56 PM  Result Value Ref Range Status   Enterococcus faecalis NOT DETECTED NOT DETECTED Final   Enterococcus Faecium NOT DETECTED NOT DETECTED Final   Listeria monocytogenes NOT DETECTED NOT DETECTED Final   Staphylococcus species NOT DETECTED NOT DETECTED Final   Staphylococcus aureus (BCID) NOT DETECTED NOT DETECTED Final   Staphylococcus epidermidis NOT DETECTED NOT DETECTED Final   Staphylococcus lugdunensis NOT DETECTED NOT DETECTED  Final   Streptococcus species DETECTED (A) NOT DETECTED Final    Comment: Not Enterococcus species, Streptococcus agalactiae, Streptococcus pyogenes, or Streptococcus pneumoniae. CRITICAL RESULT CALLED TO, READ BACK BY AND VERIFIED WITH: PHARMD TONY R. [id number] AT 1956, ADC    Streptococcus agalactiae NOT DETECTED NOT DETECTED Final   Streptococcus pneumoniae NOT DETECTED NOT DETECTED Final   Streptococcus pyogenes NOT DETECTED NOT DETECTED Final   A.calcoaceticus-baumannii NOT DETECTED NOT DETECTED Final   Bacteroides fragilis NOT DETECTED NOT DETECTED Final   Enterobacterales NOT DETECTED NOT DETECTED Final   Enterobacter cloacae complex NOT DETECTED NOT DETECTED Final   Escherichia coli NOT DETECTED NOT DETECTED Final   Klebsiella aerogenes NOT  DETECTED NOT DETECTED Final   Klebsiella oxytoca NOT DETECTED NOT DETECTED Final   Klebsiella pneumoniae NOT DETECTED NOT DETECTED Final   Proteus species NOT DETECTED NOT DETECTED Final   Salmonella species NOT DETECTED NOT DETECTED Final   Serratia marcescens NOT DETECTED NOT DETECTED Final   Haemophilus influenzae NOT DETECTED NOT DETECTED Final   Neisseria meningitidis NOT DETECTED NOT DETECTED Final   Pseudomonas aeruginosa NOT DETECTED NOT DETECTED Final   Stenotrophomonas maltophilia NOT DETECTED NOT DETECTED Final   Candida albicans NOT DETECTED NOT DETECTED Final   Candida auris NOT DETECTED NOT DETECTED Final   Candida glabrata NOT DETECTED NOT DETECTED Final   Candida krusei NOT DETECTED NOT DETECTED Final   Candida parapsilosis NOT DETECTED NOT DETECTED Final   Candida tropicalis NOT DETECTED NOT DETECTED Final   Cryptococcus neoformans/gattii NOT DETECTED NOT DETECTED Final    Comment: Performed at [facility], [address] [place], [place] [address]  Aerobic/Anaerobic Culture w Gram Stain (surgical/deep wound)     Status: None   Collection Time: 01/27/24  6:14 PM   Specimen: PATH Soft tissue resection  Result Value Ref Range Status   Specimen Description TISSUE  Final   Special Requests LEFT, 1ST RAY AMP  Final   Gram Stain   Final    NO WBC SEEN ABUNDANT GRAM POSITIVE COCCI MODERATE GRAM NEGATIVE RODS RARE GRAM POSITIVE RODS    Culture   Final    MODERATE ENTEROCOCCUS FAECALIS MODERATE STREPTOCOCCUS GROUP F Beta hemolytic streptococci are predictably susceptible to penicillin and other beta lactams. Susceptibility testing not routinely performed. FEW PREVOTELLA SPECIES BETA LACTAMASE POSITIVE Performed at [facility], [address] [place], [place] [address]    Report Status 01/30/2024 FINAL  Final   Organism ID, Bacteria ENTEROCOCCUS FAECALIS  Final      Susceptibility   Enterococcus faecalis - MIC*    AMPICILLIN  <=2 SENSITIVE Sensitive      VANCOMYCIN  1 SENSITIVE Sensitive     GENTAMICIN  SYNERGY SENSITIVE Sensitive     * MODERATE ENTEROCOCCUS FAECALIS  Aerobic/Anaerobic Culture w Gram Stain (surgical/deep wound)     Status: None   Collection Time: 01/27/24  6:15 PM   Specimen: PATH Bone resection; Tissue  Result Value Ref Range Status   Specimen Description BONE  Final   Special Requests LEFT, 1ST RAY BONE  Final   Gram Stain   Final    NO WBC SEEN MODERATE GRAM POSITIVE COCCI MODERATE GRAM NEGATIVE RODS FEW GRAM POSITIVE RODS    Culture   Final    MODERATE ENTEROCOCCUS FAECALIS SUSCEPTIBILITIES PERFORMED ON PREVIOUS CULTURE WITHIN THE LAST 5 DAYS. MODERATE STREPTOCOCCUS GROUP F Beta hemolytic streptococci are predictably susceptible to penicillin and other beta lactams. Susceptibility testing not routinely performed. FEW PREVOTELLA SPECIES  BETA LACTAMASE POSITIVE Performed at [facility], [address], [place], [place] [address]    Report Status 01/30/2024 FINAL  Final  Culture, blood (Routine X 2) w Reflex to ID Panel     Status: None (Preliminary result)   Collection Time: 01/29/24 10:55 AM   Specimen: BLOOD LEFT ARM  Result Value Ref Range Status   Specimen Description BLOOD LEFT ARM  Final   Special Requests   Final    BOTTLES DRAWN AEROBIC AND ANAEROBIC Blood Culture results may not be optimal due to an inadequate volume of blood received in culture bottles   Culture   Final    NO GROWTH 4 DAYS Performed at [facility], [address], [place], [place] [address]    Report Status PENDING  Incomplete  Culture, blood (Routine X 2) w Reflex to ID Panel     Status: None (Preliminary result)   Collection Time: 01/29/24 10:55 AM   Specimen: BLOOD LEFT ARM  Result Value Ref Range Status   Specimen Description BLOOD LEFT ARM  Final   Special Requests   Final    BOTTLES DRAWN AEROBIC AND ANAEROBIC Blood Culture results may not be optimal due to an inadequate volume of blood received in culture  bottles   Culture   Final    NO GROWTH 4 DAYS Performed at [facility], [address] [place], [place] [address]    Report Status PENDING  Incomplete         Radiology Studies: ECHO TEE Result Date: 02/02/2024    TRANSESOPHOGEAL ECHO REPORT   Patient Name:   Dakota Becker [facility] Date of Exam: 02/02/2024 Medical Rec #:  [id number]       Height:       73.0 in Accession #:    [id number]      Weight:       228.8 lb Date of Birth:  03-31-1976       BSA:          2.278 m Patient Age:    47 years        BP:           156/85 mmHg Patient Gender: M               HR:           93 bpm. Exam Location:  Inpatient Procedure: Transesophageal Echo, Color Doppler and Cardiac Doppler (Both            Spectral and Color Flow Doppler were utilized during procedure). Indications:     endocarditis  History:         Patient has prior history of Echocardiogram examinations, most                  recent 01/31/2024. Signs/Symptoms:Bacteremia; Risk                  Factors:Diabetes and Hypertension.  Sonographer:     Dione Franks RDCS Referring Phys:  [id number] Darline Eis HALEY Diagnosing Phys: Maudine Sos MD PROCEDURE: After discussion of the risks and benefits of a TEE, an informed consent was obtained from the patient. The transesophogeal probe was passed without difficulty through the esophogus of the patient. Imaged were obtained with the patient in a left lateral decubitus position. Local oropharyngeal anesthetic was provided with viscous lidocaine . Sedation performed by different physician. The patient was monitored while under deep sedation. Anesthestetic sedation was provided intravenously by Anesthesiology: 173mg   of Propofol , 20mg  of Lidocaine . The patient's vital signs; including heart rate, blood pressure, and oxygen saturation; remained stable throughout the procedure. The patient developed no complications during the procedure.  IMPRESSIONS  1. Left ventricular ejection fraction, by estimation, is 60 to 65%. The  left ventricle has normal function. The left ventricle has no regional wall motion abnormalities.  2. Right ventricular systolic function is normal. The right ventricular size is normal.  3. No left atrial/left atrial appendage thrombus was detected.  4. The mitral valve is normal in structure. Trivial mitral valve regurgitation. No evidence of mitral stenosis.  5. The aortic valve is tricuspid. Aortic valve regurgitation is not visualized. No aortic stenosis is present. Conclusion(s)/Recommendation(s): Normal biventricular function without evidence of hemodynamically significant valvular heart disease. No evidence of vegetation/infective endocarditis on this transesophageael echocardiogram. FINDINGS  Left Ventricle: Left ventricular ejection fraction, by estimation, is 60 to 65%. The left ventricle has normal function. The left ventricle has no regional wall motion abnormalities. The left ventricular internal cavity size was normal in size. There is  no left ventricular hypertrophy. Right Ventricle: The right ventricular size is normal. No increase in right ventricular wall thickness. Right ventricular systolic function is normal. Left Atrium: Left atrial size was normal in size. No left atrial/left atrial appendage thrombus was detected. Right Atrium: Right atrial size was normal in size. Pericardium: There is no evidence of pericardial effusion. Mitral Valve: The mitral valve is normal in structure. Trivial mitral valve regurgitation. No evidence of mitral valve stenosis. Tricuspid Valve: The tricuspid valve is normal in structure. Tricuspid valve regurgitation is not demonstrated. No evidence of tricuspid stenosis. Aortic Valve: The aortic valve is tricuspid. Aortic valve regurgitation is not visualized. No aortic stenosis is present. Pulmonic Valve: The pulmonic valve was normal in structure. Pulmonic valve regurgitation is not visualized. No evidence of pulmonic stenosis. Aorta: The aortic root is normal in  size and structure. IAS/Shunts: No atrial level shunt detected by color flow Doppler. Maudine Sos MD Electronically signed by Maudine Sos MD Signature Date/Time: 02/02/2024/2:04:27 PM    Final         Scheduled Meds:  acetaminophen   650 mg Rectal Once   amoxicillin -clavulanate  1 tablet Oral Q12H   apixaban   10 mg Oral BID   Followed by   Cecily Cohen ON 02/06/2024] apixaban   5 mg Oral BID   gabapentin   300 mg Oral TID   insulin  aspart  0-15 Units Subcutaneous TID WC   insulin  aspart  0-5 Units Subcutaneous QHS   insulin  glargine-yfgn  15 Units Subcutaneous Q24H   lisinopril   20 mg Oral Daily   mupirocin  ointment   Topical Daily   pneumococcal 20-valent conjugate vaccine  0.5 mL Intramuscular Tomorrow-1000   polyethylene glycol  17 g Oral Daily   senna-docusate  1 tablet Oral QHS   Continuous Infusions:     LOS: 6 days    Time spent: 40 minutes    Hilda Lovings, MD [facility]   To contact the attending provider between 7A-7P or the covering provider during after hours 7P-7A, please log into the web site [email or web address] and access using universal [employer] password for that web site. If you do not have the password, please call the hospital operator.  02/02/2024, 6:21 PM

## 2024-02-02 NOTE — Progress Notes (Signed)
 Physical Therapy Treatment Patient Details Name: Dakota Becker MRN: [id number] DOB: Feb 18, 1976 Today's Date: 02/02/2024   History of Present Illness Pt is a 48 y.o. male presenting 5/3 with L foot pain, blisters on L great toe. Found to have gangrene, osteomyelitis of first ray and 2nd toe. 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 Underwent L foot partial second MT resection and wound vac placement on 01/29/24.  PMH significant for DM, HTN, HLD.    PT Comments  Pt's weight bearing status changed from LLE NWB to PWB through L heel only in post-op shoe. He engaged in four bouts of short-distance gait using RW with CGA and a chair follow. Pt completed transfers with supervision and demonstrated proper sequencing of RW. He ambulated ~50ft taking short slow steps and only accepting weight into his L heel. Pt was receptive to [facility] to correct gait mechanics and demonstrated modification in following bouts. He is making steady progress towards his acute PT goals and is highly motivated. Will continue to follow acutely and advance appropriately.    If plan is discharge home, recommend the following: A little help with walking and/or transfers;A little help with bathing/dressing/bathroom;Assistance with cooking/housework;Assist for transportation;Help with stairs or ramp for entrance   Can travel by private vehicle     Yes  Equipment Recommendations  Crutches;Rolling walker (2 wheels);BSC/3in1    Recommendations for Other Services       Precautions / Restrictions Precautions Precautions: Fall Recall of Precautions/Restrictions: Intact Precaution/Restrictions Comments: watch HR Required Braces or Orthoses: Other Brace Other Brace: post-op shoe on L Restrictions Weight Bearing Restrictions Per Provider Order: Yes LLE Weight Bearing Per Provider Order: Partial weight bearing LLE Partial Weight Bearing Percentage or Pounds: Heel only     Mobility  Bed  Mobility Overal bed mobility: Needs Assistance Bed Mobility: Supine to Sit     Supine to sit: Supervision     General bed mobility comments: Pt sat up on left side of bed and scooted fwd to EOB with BUE support.    Transfers Overall transfer level: Needs assistance Equipment used: Rolling walker (2 wheels) Transfers: Sit to/from Stand Sit to Stand: Supervision           General transfer comment: Pt stood from lowest bed height and recliner chair without physical assist. Proper hand placement and sequencing. Good eccentric control with sitting.    Ambulation/Gait Ambulation/Gait assistance: Contact guard assist, +2 safety/equipment (Chair Follow) Gait Distance (Feet): 20 Feet (x4, seated rest) Assistive device: Rolling walker (2 wheels) Gait Pattern/deviations: Step-to pattern, Decreased stride length, Decreased step length - right, Decreased stance time - left, Step-through pattern Gait velocity: decreased Gait velocity interpretation: <1.31 ft/sec, indicative of household ambulator   General Gait Details: Pt ambulated with short slow steps accpeting weight in the heel of his LLE. He tended to allow RW to get too far in front of him leading to a slight fwd flex. VC to correct posture and increase step length. Carryover noted in following gait bouts.  Pt maintained a fixed gaze on his feet, cued pt to look ahead and intermittently glance down at his feet which he quickly corrected. Pt required seated rest breaks d/t tachycardia, mild diaphoresis, and fatigue.   Stairs             Wheelchair Mobility     Tilt Bed    Modified Rankin (Stroke Patients Only)       Balance Overall balance assessment:  Needs assistance Sitting-balance support: Feet supported Sitting balance-Leahy Scale: Good     Standing balance support: Bilateral upper extremity supported, Reliant on assistive device for balance, During functional activity Standing balance-Leahy Scale:  Poor Standing balance comment: Pt dependent on RW                            Communication Communication Communication: No apparent difficulties  Cognition Arousal: Alert Behavior During Therapy: WFL for tasks assessed/performed   PT - Cognitive impairments: No apparent impairments                         Following commands: Intact      Cueing Cueing Techniques: Verbal cues  Exercises      General Comments General comments (skin integrity, edema, etc.): Pt became tachycardic with mobility, HR 130-150s with max at 155bpm. Noted blood on pt's R sock on the side of his big toe at end of session. Found to have a calluse that began to bleed, RN notified.      Pertinent Vitals/Pain Pain Assessment Pain Assessment: Faces Faces Pain Scale: Hurts a little bit Pain Location: LLE Pain Descriptors / Indicators: Throbbing, Aching Pain Intervention(s): Monitored during session, Repositioned    Home Living                          Prior Function            PT Goals (current goals can now be found in the care plan section) Acute Rehab PT Goals Patient Stated Goal: Bounce back to my independence Progress towards PT goals: Progressing toward goals    Frequency    Min 2X/week      PT Plan      Co-evaluation              AM-PAC PT "6 Clicks" Mobility   Outcome Measure  Help needed turning from your back to your side while in a flat bed without using bedrails?: A Little Help needed moving from lying on your back to sitting on the side of a flat bed without using bedrails?: A Little Help needed moving to and from a bed to a chair (including a wheelchair)?: A Little Help needed standing up from a chair using your arms (e.g., wheelchair or bedside chair)?: A Little Help needed to walk in hospital room?: Total Help needed climbing 3-5 steps with a railing? : Total 6 Click Score: 14    End of Session Equipment Utilized During Treatment:  Gait belt;Other (comment) (post-op shoe) Activity Tolerance: Patient tolerated treatment well Patient left: in chair;with call bell/phone within reach;with family/visitor present Nurse Communication: Mobility status;Other (comment) (tachycardia and R medial great toe wound) PT Visit Diagnosis: Difficulty in walking, not elsewhere classified (R26.2);Pain Pain - Right/Left: Left Pain - part of body: Ankle and joints of foot     Time: 7829-5621 PT Time Calculation (min) (ACUTE ONLY): 26 min  Charges:    $Gait Training: 23-37 mins PT General Charges $$ ACUTE PT VISIT: 1 Visit                     Glenford Lanes, PT, DPT Acute Rehabilitation Services Office: [phone] Secure Chat Preferred  Riva Chester 02/02/2024, 2:55 PM

## 2024-02-02 NOTE — Plan of Care (Signed)

## 2024-02-02 NOTE — Transfer of Care (Signed)
 Immediate Anesthesia Transfer of Care Note  Patient: Dakota Becker  Procedure(s) Performed: TRANSESOPHAGEAL ECHOCARDIOGRAM  Patient Location: PACU  Anesthesia Type:MAC  Level of Consciousness: awake, alert , and oriented  Airway & Oxygen Therapy: Patient Spontanous Breathing and Patient connected to nasal cannula oxygen  Post-op Assessment: Report given to RN and Post -op Vital signs reviewed and stable  Post vital signs: Reviewed and stable  Last Vitals:  Vitals Value Taken Time  BP 125/72 02/02/24 1245  Temp 36.5 C 02/02/24 1240  Pulse 97 02/02/24 1246  Resp 17 02/02/24 1246  SpO2 98 % 02/02/24 1246  Vitals shown include unfiled device data.  Last Pain:  Vitals:   02/02/24 1240  TempSrc: Tympanic  PainSc: 0-No pain      Patients Stated Pain Goal: 2 (01/31/24 0751)  Complications: No notable events documented.

## 2024-02-02 NOTE — Progress Notes (Signed)
 [facility]  Date of Admission:  01/27/2024     Principal Problem:   Toe osteomyelitis, left (HCC) Active Problems:   Gas gangrene of foot (HCC)   Gangrene of toe of left foot (HCC)   Controlled type 2 diabetes mellitus without complication, without long-term current use of insulin  (HCC)   Hypercalcemia   Constipation   Hypertension   Acute deep vein thrombosis (DVT) of left lower extremity (HCC)   Type 2 diabetes mellitus without complication, without long-term current use of insulin  (HCC)   Bacteremia          Assessment: 48 year old male with left foot wound which started blister parotic in March followed by podiatry, referred to wound care presented with left foot infection found to have: #Strep constellatus bacteremia secondary to left foot gas gangrene/osteomyelitis - CT of left foot showed necrotizing fasciitis of great toe, emphysema osteomyelitis or osteonecrosis -Patient febrile on admission.  Blood cultures grew strep constellatus.  He underwent first ray amputation second toe amputation at [facility] with podiatry.  Or cultures growing E faecalis and obstructive. -5/3 Or cultures grew E faecalis pansensitive, group f strep - Taken to the OR again on 5/5 for repeat I&D and second metatarsal resection, no cultures.  Patient has wound VAC on at this point.  -TEE no evidence of endocarditis #Diabetes mellitus poorly controlled - A1c 9.8 on 01/27/2024 - Needs glycemic control for optimal wound healing  Recommendations: -Continue unasyn ->discharge on augmentin  to complete 6 weeks from negative blood Cx EOT 6/15 -Communicated with podiatry, no plans for OR during this hospitalization -ID follow-up on 6/3 -ID will sign off  Evaluation of this patient requires complex antimicrobial therapy evaluation and counseling + isolation needs for disease transmission risk assessment and mitigation   Microbiology:   Antibiotics: Unasyn  5/2, present 5/3  linezolid -present Metronidazole  5/4 Pip-tazo 5/3 Vancomycin  5/2 Cultures: Blood 5/22/2 sets growing strep constellatus Urine   Other 5/3 wound cultures growing E faecalis in group of stress   SUBJECTIVE: Resting in bed . No new complaints Interval: Afebrile overnight, wbc 15k  Review of Systems: Review of Systems  All other systems reviewed and are negative.    Scheduled Meds:  acetaminophen   650 mg Rectal Once   amoxicillin -clavulanate  1 tablet Oral Q12H   apixaban   10 mg Oral BID   Followed by   Cecily Cohen ON 02/06/2024] apixaban   5 mg Oral BID   gabapentin   300 mg Oral TID   insulin  aspart  0-15 Units Subcutaneous TID WC   insulin  aspart  0-5 Units Subcutaneous QHS   insulin  glargine-yfgn  15 Units Subcutaneous Q24H   lisinopril   20 mg Oral Daily   mupirocin  ointment   Topical Daily   pneumococcal 20-valent conjugate vaccine  0.5 mL Intramuscular Tomorrow-1000   polyethylene glycol  17 g Oral Daily   senna-docusate  1 tablet Oral QHS   Continuous Infusions:   PRN Meds:.acetaminophen  **OR** acetaminophen , mouth rinse, oxyCODONE -acetaminophen , polyethylene glycol, prochlorperazine  No Known Allergies  OBJECTIVE: Vitals:   02/02/24 1310 02/02/24 1329 02/02/24 1546 02/02/24 2025  BP: (!) 144/76  (!) 149/83 128/79  Pulse: 95  95 94  Resp: 18  20   Temp:  98.6 F (37 C) 98.8 F (37.1 C) 98.9 F (37.2 C)  TempSrc:  Oral Oral Oral  SpO2: 97%  100% 93%  Weight:      Height:       Body mass index is 30.19  kg/m.  Physical Exam Constitutional:      General: He is not in acute distress.    Appearance: He is normal weight. He is not toxic-appearing.  HENT:     Head: Normocephalic and atraumatic.     Right Ear: External ear normal.     Left Ear: External ear normal.     Nose: No congestion or rhinorrhea.     Mouth/Throat:     Mouth: Mucous membranes are moist.     Pharynx: Oropharynx is clear.  Eyes:     Extraocular Movements: Extraocular movements intact.      Conjunctiva/sclera: Conjunctivae normal.     Pupils: Pupils are equal, round, and reactive to light.  Cardiovascular:     Rate and Rhythm: Normal rate and regular rhythm.     Heart sounds: No murmur heard.    No friction rub. No gallop.  Pulmonary:     Effort: Pulmonary effort is normal.     Breath sounds: Normal breath sounds.  Abdominal:     General: Abdomen is flat. Bowel sounds are normal.     Palpations: Abdomen is soft.  Musculoskeletal:        General: No swelling.     Cervical back: Normal range of motion and neck supple.     Comments: Wound VAC  Skin:    General: Skin is warm and dry.  Neurological:     General: No focal deficit present.     Mental Status: He is oriented to person, place, and time.  Psychiatric:        Mood and Affect: Mood normal.       Lab Results Lab Results  Component Value Date   WBC 12.0 (H) 02/02/2024   HGB 11.4 (L) 02/02/2024   HCT 35.3 (L) 02/02/2024   MCV 88.9 02/02/2024   PLT 447 (H) 02/02/2024    Lab Results  Component Value Date   CREATININE 0.70 02/02/2024   BUN 9 02/02/2024   NA 134 (L) 02/02/2024   K 3.8 02/02/2024   CL 97 (L) 02/02/2024   CO2 26 02/02/2024    Lab Results  Component Value Date   ALT 23 01/28/2024   AST 25 01/28/2024   ALKPHOS 94 01/28/2024   BILITOT 2.3 (H) 01/28/2024        [name], MD [facility] 02/02/2024, 11:12 PM

## 2024-02-02 NOTE — Interval H&P Note (Signed)
 History and Physical Interval Note:  02/02/2024 10:29 AM  Dakota Becker  has presented today for surgery, with the diagnosis of bacteremia.  The various methods of treatment have been discussed with the patient and family. After consideration of risks, benefits and other options for treatment, the patient has consented to  Procedure(s): TRANSESOPHAGEAL ECHOCARDIOGRAM (N/A) as a surgical intervention.  The patient's history has been reviewed, patient examined, no change in status, stable for surgery.  I have reviewed the patient's chart and labs.  Questions were answered to the patient's satisfaction.     Maudine Sos, MD

## 2024-02-02 NOTE — Progress Notes (Signed)
  Echocardiogram Echocardiogram Transesophageal has been performed.  Dione Franks 02/02/2024, 12:39 PM

## 2024-02-02 NOTE — Consult Note (Signed)
 WOC Nurse Consult Note: Reason for Consult: NPWT to L foot  Wound type: surgical Pressure Injury POA: NA Measurement: 5cm x 5 cm x 0.1 cm Wound bed: covered with Integra graft attached by staples Drainage serosanguineous, minimal, in canister   Periwound: intact  Dressing procedure/placement/frequency: Podiatry had removed NPWT prior to WTA arrival.    Distal staple line protected with Mepitel.     Covered wound with  1 piece of black foam  Sealed NPWT dressing at HG  4x4 gauze used to bolster tubing and at intact toe, wrapped with Kerlix and ace wrap.  Patient tolerated procedure well Supplies at bedside.  WOC nurse will continue to provide NPWT dressing changed due to the complexity of the dressing change.     Gillermo Lack, RN, MSN, [facility] WOC Team

## 2024-02-02 NOTE — Interval H&P Note (Signed)
 History and Physical Interval Note:  02/02/2024 12:16 PM  Dakota Becker  has presented today for surgery, with the diagnosis of bacteremia.  The various methods of treatment have been discussed with the patient and family. After consideration of risks, benefits and other options for treatment, the patient has consented to  Procedure(s): TRANSESOPHAGEAL ECHOCARDIOGRAM (N/A) as a surgical intervention.  The patient's history has been reviewed, patient examined, no change in status, stable for surgery.  I have reviewed the patient's chart and labs.  Questions were answered to the patient's satisfaction.     Maudine Sos, MD

## 2024-02-03 ENCOUNTER — Encounter (HOSPITAL_COMMUNITY): Payer: Self-pay | Admitting: Cardiovascular Disease

## 2024-02-03 DIAGNOSIS — M869 Osteomyelitis, unspecified: Secondary | ICD-10-CM | POA: Diagnosis not present

## 2024-02-03 DIAGNOSIS — A480 Gas gangrene: Secondary | ICD-10-CM | POA: Diagnosis not present

## 2024-02-03 DIAGNOSIS — E119 Type 2 diabetes mellitus without complications: Secondary | ICD-10-CM | POA: Diagnosis not present

## 2024-02-03 LAB — CBC
HCT: 35.9 % — ABNORMAL LOW (ref 39.0–52.0)
Hemoglobin: 11.7 g/dL — ABNORMAL LOW (ref 13.0–17.0)
MCH: 29 pg (ref 26.0–34.0)
MCHC: 32.6 g/dL (ref 30.0–36.0)
MCV: 89.1 fL (ref 80.0–100.0)
Platelets: 448 10*3/uL — ABNORMAL HIGH (ref 150–400)
RBC: 4.03 MIL/uL — ABNORMAL LOW (ref 4.22–5.81)
RDW: 13.2 % (ref 11.5–15.5)
WBC: 11.8 10*3/uL — ABNORMAL HIGH (ref 4.0–10.5)
nRBC: 0 % (ref 0.0–0.2)

## 2024-02-03 LAB — CULTURE, BLOOD (ROUTINE X 2)
Culture: NO GROWTH
Culture: NO GROWTH

## 2024-02-03 LAB — BASIC METABOLIC PANEL WITH GFR
Anion gap: 9 (ref 5–15)
BUN: 6 mg/dL (ref 6–20)
CO2: 27 mmol/L (ref 22–32)
Calcium: 8.9 mg/dL (ref 8.9–10.3)
Chloride: 98 mmol/L (ref 98–111)
Creatinine, Ser: 0.74 mg/dL (ref 0.61–1.24)
GFR, Estimated: >60 mL/min (ref >60)
Glucose, Bld: 296 mg/dL — ABNORMAL HIGH (ref 70–99)
Potassium: 4 mmol/L (ref 3.5–5.1)
Sodium: 134 mmol/L — ABNORMAL LOW (ref 135–145)

## 2024-02-03 LAB — GLUCOSE, CAPILLARY
Glucose-Capillary: 262 mg/dL — ABNORMAL HIGH (ref 70–99)
Glucose-Capillary: 258 mg/dL — ABNORMAL HIGH (ref 70–99)
Glucose-Capillary: 256 mg/dL — ABNORMAL HIGH (ref 70–99)
Glucose-Capillary: 220 mg/dL — ABNORMAL HIGH (ref 70–99)
Glucose-Capillary: 232 mg/dL — ABNORMAL HIGH (ref 70–99)

## 2024-02-03 LAB — MAGNESIUM: Magnesium: 1.9 mg/dL (ref 1.7–2.4)

## 2024-02-03 MED ORDER — INSULIN ASPART 100 UNIT/ML IJ SOLN
4.0000 [IU] | Freq: Three times a day (TID) | INTRAMUSCULAR | Status: DC
  Administered 2024-02-03 – 2024-02-04 (×3): 4 [IU] via SUBCUTANEOUS

## 2024-02-03 MED ORDER — INSULIN GLARGINE-YFGN 100 UNIT/ML ~~LOC~~ SOLN
20.0000 [IU] | SUBCUTANEOUS | Status: DC
  Administered 2024-02-03: 20 [IU] via SUBCUTANEOUS
  Filled 2024-02-03 (×2): qty 0.2

## 2024-02-03 NOTE — Progress Notes (Signed)
 PROGRESS NOTE    Dakota Becker  ZOX:[id number] DOB: Jul 18, 1976 DOA: 01/27/2024 PCP: Alejandro Hurt, FNP    Chief Complaint  Patient presents with   Foot Pain   Wound Infection   Code Sepsis    Brief Narrative:   Dakota Becker is a 48 y.o. male with medical history significant of diabetes, essential hypertension, hyperlipidemia, with great toe ulcer developed some blisters.  Not taking his diabetic medication.  He went to PCP and was put on oral antibiotics and referred to podiatry.  Recommended wound management but he never got contacted so started having increasing swelling in the left toe with pain for 5 days or so with some chills and nausea ,vomiting and foul odor.  Patient then presented to [facility].  Upon arrival to the ED podiatry was consulted.  Patient  met sepsis criteria secondary to osteomyelitis of the left great toe.  Patient was then taken to the OR and underwent left hallux amputation.  Patient underwent repeat debridement on 01/29/2024 by podiatry and ID  has been consulted for antibiotic management..    Assessment & Plan:   Principal Problem:   Toe osteomyelitis, left (HCC) Active Problems:   Gas gangrene of foot (HCC)   Gangrene of toe of left foot (HCC)   Controlled type 2 diabetes mellitus without complication, without long-term current use of insulin  (HCC)   Hypercalcemia   Constipation   Hypertension   Acute deep vein thrombosis (DVT) of left lower extremity (HCC)   Type 2 diabetes mellitus without complication, without long-term current use of insulin  (HCC)   Bacteremia  #1 sepsis secondary to gas gangrene of the left great toe with osteomyelitis/Streptococcus bacteremia -Patient seen in consultation by podiatry and underwent surgical intervention on admission with partial first ray amputation of the left foot and second toe amputation at MPJ level of the left foot with application of dissolvable antibiotic beads of the left foot. -  Patient followed by podiatry underwent repeat irrigation and debridement with allograft application with wound VAC treatment on 01/29/2024. - Per podiatry patient high risk for need for revision including transmetatarsal amputation. - ID consulted and patient currently on linezolid  and Unasyn . - NWB per podiatry. - Blood cultures from 01/27/2024 with Streptococcus constellatus with repeat blood cultures from 01/29/2024 with no growth to date. - Wound cultures with Enterococcus and Streptococcus noted. - Patient with a leukocytosis which has started to trend down. - Fever curve has trended down. - 2D echo with a EF of 60 to 65%, NWMA, grade 1 diastolic dysfunction, no vegetations noted. -ID recommended TEE which was done this morning to 02/02/2024 with a EF of 60 to 65%, no LA/LAA thrombus or masses noted, no evidence of endocarditis.  - Was on continue Unasyn , linezolid . -ID changed antibiotics to Augmentin  to complete 6 weeks from negative blood culture with EOT 03/10/2024. -ID was following but has signed off as of 02/02/2024. -Podiatry with no further plans to go to the OR, recommending to transition to heel WB in postop shoe for short distance transfers and will need outpatient follow-up 2 weeks postdischarge. - Podiatry was following but have signed off as of 02/02/2024.  2.  Left lower extremity DVT -Patient started on Eliquis  for anticoagulation as no further surgical intervention planned at this time.  3.  Nausea/decreased appetite - Resolved.  4.  Constipation - Patient placed on MiraLAX  twice daily, Senokot-S twice daily and received sorbitol  x 2 doses as well as Dulcolax suppository with  good results.   - Patient having ongoing bowel movements.   - Continue MiraLAX  daily, Senokot-S daily. - Follow.  5.  Diabetes mellitus type 2 -Hemoglobin A1c 9.8. - CBG 262 this morning. - Patient noted to be on glipizide, Jardiance, semaglutide prior to admission. - Increase Semglee  to 20 units daily.    -Start NovoLog  4 units 3 times daily meal coverage. - SSI.  - Diabetes coordinator following.  6.  Hypertension - BP stable.   - Continue lisinopril  20 mg daily.   7.  Class I obesity -BMI of 32.84 kg/m. -Lifestyle modification - Outpatient follow-up with PCP.  8.  Hypercalcemia -Resolved.   DVT prophylaxis: Eliquis  Code Status: Full Family Communication: Updated patient.  No family at bedside. Disposition: SNF when bed available.  Status is: Inpatient Remains inpatient appropriate because: Severity of illness   Consultants:  Podiatry: Dr. Rosemarie Conquest 01/27/2024 ID: Dr. Zelda Hickman 01/29/2024 Wound care RN  Procedures: 2D echo 01/31/2024 CT left ankle and left foot 01/27/2024 Plain films of the left foot 01/27/2024, 01/29/2024 Lower extremity Dopplers 01/30/2024 Lower extremity arterial duplex 01/30/2024 Partial first ray amputation left foot/second toe amputation at MPJ level of the left foot/application of dissolvable antibiotic beads left foot per podiatry: Dr. Demetria Finch 01/27/2024 Repeat irrigation and debridement partial second metatarsal resection, left foot/dermal allograft application 5 x 4 cm left foot/application negative pressure wound therapy 5/4 cm, left foot per podiatry: Dr. Rosemarie Conquest 01/29/2024 TEE 02/02/2024  Antimicrobials: Anti-infectives (From admission, onward)    Start     Dose/Rate Route Frequency Ordered Stop   02/02/24 2200  amoxicillin -clavulanate (AUGMENTIN ) 875-125 MG per tablet 1 tablet        1 tablet Oral Every 12 hours 02/02/24 1533 03/12/24 0959   01/30/24 1200  Ampicillin -Sulbactam (UNASYN ) 3 g in sodium chloride  0.9 % 100 mL IVPB  Status:  Discontinued        3 g 200 mL/hr over 30 Minutes Intravenous Every 6 hours 01/30/24 1103 02/02/24 1533   01/29/24 2200  linezolid  (ZYVOX ) tablet 600 mg  Status:  Discontinued        600 mg Oral Every 12 hours 01/29/24 1040 02/02/24 1530   01/29/24 1130  cefTRIAXone  (ROCEPHIN ) 2 g in sodium chloride  0.9 % 100 mL IVPB   Status:  Discontinued        2 g 200 mL/hr over 30 Minutes Intravenous Every 24 hours 01/29/24 1036 01/30/24 1103   01/28/24 1130  piperacillin -tazobactam (ZOSYN ) IVPB 3.375 g  Status:  Discontinued        3.375 g 12.5 mL/hr over 240 Minutes Intravenous Every 8 hours 01/28/24 1035 01/29/24 1036   01/28/24 1000  metroNIDAZOLE  (FLAGYL ) IVPB 500 mg  Status:  Discontinued        500 mg 100 mL/hr over 60 Minutes Intravenous Every 12 hours 01/28/24 0925 01/28/24 1035   01/27/24 2200  linezolid  (ZYVOX ) IVPB 600 mg  Status:  Discontinued        600 mg 300 mL/hr over 60 Minutes Intravenous Every 12 hours 01/27/24 1953 01/27/24 2007   01/27/24 1835  vancomycin  (VANCOCIN ) powder  Status:  Discontinued          As needed 01/27/24 1835 01/27/24 1843   01/27/24 1834  tobramycin  (NEBCIN ) injection  Status:  Discontinued          As needed 01/27/24 1835 01/27/24 1843   01/27/24 1515  linezolid  (ZYVOX ) IVPB 600 mg  Status:  Discontinued        600 mg 300  mL/hr over 60 Minutes Intravenous Every 12 hours 01/27/24 1502 01/29/24 1036   01/27/24 1515  piperacillin -tazobactam (ZOSYN ) IVPB 3.375 g        3.375 g 100 mL/hr over 30 Minutes Intravenous  Once 01/27/24 1505 01/27/24 1635   01/27/24 1430  vancomycin  (VANCOCIN ) IVPB 1000 mg/200 mL premix  Status:  Discontinued       Placed in "Followed by" Linked Group   1,000 mg 200 mL/hr over 60 Minutes Intravenous  Once 01/27/24 1419 01/27/24 1502   01/27/24 1430  vancomycin  (VANCOCIN ) IVPB 1000 mg/200 mL premix  Status:  Discontinued       Placed in "Followed by" Linked Group   1,000 mg 200 mL/hr over 60 Minutes Intravenous  Once 01/27/24 1419 01/27/24 1502   01/27/24 1415  Ampicillin -Sulbactam (UNASYN ) 3 g in sodium chloride  0.9 % 100 mL IVPB  Status:  Discontinued       Placed in "And" Linked Group   3 g 200 mL/hr over 30 Minutes Intravenous  Once 01/27/24 1412 01/27/24 1635   01/27/24 1415  vancomycin  (VANCOCIN ) IVPB 1000 mg/200 mL premix  Status:   Discontinued       Placed in "And" Linked Group   1,000 mg 200 mL/hr over 60 Minutes Intravenous  Once 01/27/24 1412 01/27/24 1419         Subjective: Patient sitting up in recliner.  Just finished working with therapy.  Complaining of nausea and some mid abdominal discomfort after eating a piece of sausage and taking multiple pills.  No emesis.  No chest pain, no shortness of breath.  Still having bowel movements.   Objective: Vitals:   02/03/24 0039 02/03/24 0439 02/03/24 0757 02/03/24 1132  BP: (!) 151/89 (!) 154/80 134/82 130/79  Pulse: 93 93    Resp:   18 18  Temp: 99.1 F (37.3 C) 98.8 F (37.1 C) 98.9 F (37.2 C)   TempSrc: Oral Oral Oral Oral  SpO2: 97% 95%    Weight:      Height:        Intake/Output Summary (Last 24 hours) at 02/03/2024 1143 Last data filed at 02/03/2024 0758 Gross per 24 hour  Intake 577 ml  Output 0 ml  Net 577 ml   Filed Weights   01/30/24 0421 01/31/24 0425 02/02/24 0532  Weight: 112.9 kg 104.2 kg 103.8 kg    Examination:  General exam: NAD Respiratory system: CTAB.  No wheezes, no crackles, no rhonchi.  Fair air movement.  Speaking in full sentences. Cardiovascular system: Regular rate rhythm no murmurs rubs or gallops.  No JVD.  No lower extremity edema.   Gastrointestinal system: Abdomen is soft, some abdominal discomfort in the mid abdominal region, obese, nondistended, positive bowel sounds.  No rebound.  No guarding.  Central nervous system: Alert and oriented. No focal neurological deficits. Extremities: Left foot in postop bandage with wound VAC in place.  Skin: No rashes, lesions or ulcers Psychiatry: Judgement and insight appear normal. Mood & affect appropriate.     Data Reviewed: I have personally reviewed following labs and imaging studies  CBC: Recent Labs  Lab 01/27/24 1450 01/28/24 0351 01/30/24 0357 01/31/24 0241 02/01/24 0250 02/02/24 0244 02/03/24 0314  WBC 17.6*   < > 19.0* 15.9* 15.0* 12.0* 11.8*   NEUTROABS 16.4*  --   --   --  11.7* 9.2*  --   HGB 14.3   < > 12.6* 11.9* 12.0* 11.4* 11.7*  HCT 42.2   < > 38.0* 36.4*  36.9* 35.3* 35.9*  MCV 87.9   < > 88.4 88.3 89.3 88.9 89.1  PLT 468*   < > 405* 423* 443* 447* 448*   < > = values in this interval not displayed.    Basic Metabolic Panel: Recent Labs  Lab 01/29/24 0252 01/30/24 0357 01/31/24 0241 02/01/24 0250 02/02/24 0244 02/03/24 0314  NA 133* 134* 135 134* 134* 134*  K 4.0 4.1 4.1 4.1 3.8 4.0  CL 99 99 98 97* 97* 98  CO2 21* 20* 23 24 26 27   GLUCOSE 174* 204* 206* 229* 255* 296*  BUN 9 7 9 9 9 6   CREATININE 0.91 0.89 0.84 0.76 0.70 0.74  CALCIUM 9.1 9.1 9.1 9.2 8.8* 8.9  MG 1.8  --  2.0 2.0 1.9 1.9  PHOS  --  3.3  --   --   --   --     GFR: Estimated Creatinine Clearance: 141.4 mL/min (by C-G formula based on SCr of 0.74 mg/dL).  Liver Function Tests: Recent Labs  Lab 01/27/24 1450 01/28/24 0351  AST 34 25  ALT 18 23  ALKPHOS 191* 94  BILITOT 1.0 2.3*  PROT 8.0 6.8  ALBUMIN 3.8 2.2*    CBG: Recent Labs  Lab 02/02/24 1116 02/02/24 1543 02/02/24 2117 02/03/24 0638 02/03/24 1114  GLUCAP 251* 193* 292* 262* 258*     Recent Results (from the past 240 hours)  Blood Cultures x 2 sites     Status: Abnormal   Collection Time: 01/27/24  2:55 PM   Specimen: BLOOD LEFT FOREARM  Result Value Ref Range Status   Specimen Description BLOOD LEFT FOREARM  Final   Special Requests   Final    Blood Culture adequate volume BOTTLES DRAWN AEROBIC AND ANAEROBIC   Culture  Setup Time   Final    GRAM POSITIVE COCCI IN BOTH AEROBIC AND ANAEROBIC BOTTLES CRITICAL VALUE NOTED.  VALUE IS CONSISTENT WITH PREVIOUSLY REPORTED AND CALLED VALUE.    Culture (A)  Final    STREPTOCOCCUS CONSTELLATUS SUSCEPTIBILITIES PERFORMED ON PREVIOUS CULTURE WITHIN THE LAST 5 DAYS. Performed at [facility], [address] [place], [place] [address]    Report Status 01/30/2024 FINAL  Final  Blood Cultures x 2 sites      Status: Abnormal   Collection Time: 01/27/24  2:56 PM   Specimen: BLOOD  Result Value Ref Range Status   Specimen Description   Final    BLOOD RIGHT ANTECUBITAL Performed at [facility], [address] [place], [place] [address]    Special Requests   Final    Blood Culture adequate volume BOTTLES DRAWN AEROBIC AND ANAEROBIC   Culture  Setup Time   Final    GRAM POSITIVE COCCI IN CHAINS IN BOTH AEROBIC AND ANAEROBIC BOTTLES CRITICAL RESULT CALLED TO, READ BACK BY AND VERIFIED WITH: PHARMD Normie Becton [id number] AT 1958, ADC Performed at [facility], [address] [place], [place] [address]    Culture STREPTOCOCCUS CONSTELLATUS (A)  Final   Report Status 01/30/2024 FINAL  Final   Organism ID, Bacteria STREPTOCOCCUS CONSTELLATUS  Final      Susceptibility   Streptococcus constellatus - MIC*    PENICILLIN <=0.06 SENSITIVE Sensitive     CEFTRIAXONE  0.25 SENSITIVE Sensitive     ERYTHROMYCIN <=0.12 SENSITIVE Sensitive     LEVOFLOXACIN 0.5 SENSITIVE Sensitive     VANCOMYCIN  0.5 SENSITIVE Sensitive     * STREPTOCOCCUS CONSTELLATUS  Blood Culture ID Panel (Reflexed)  Status: Abnormal   Collection Time: 01/27/24  2:56 PM  Result Value Ref Range Status   Enterococcus faecalis NOT DETECTED NOT DETECTED Final   Enterococcus Faecium NOT DETECTED NOT DETECTED Final   Listeria monocytogenes NOT DETECTED NOT DETECTED Final   Staphylococcus species NOT DETECTED NOT DETECTED Final   Staphylococcus aureus (BCID) NOT DETECTED NOT DETECTED Final   Staphylococcus epidermidis NOT DETECTED NOT DETECTED Final   Staphylococcus lugdunensis NOT DETECTED NOT DETECTED Final   Streptococcus species DETECTED (A) NOT DETECTED Final    Comment: Not Enterococcus species, Streptococcus agalactiae, Streptococcus pyogenes, or Streptococcus pneumoniae. CRITICAL RESULT CALLED TO, READ BACK BY AND VERIFIED WITH: PHARMD TONY R. [id number] AT 1956, ADC    Streptococcus agalactiae NOT DETECTED NOT  DETECTED Final   Streptococcus pneumoniae NOT DETECTED NOT DETECTED Final   Streptococcus pyogenes NOT DETECTED NOT DETECTED Final   A.calcoaceticus-baumannii NOT DETECTED NOT DETECTED Final   Bacteroides fragilis NOT DETECTED NOT DETECTED Final   Enterobacterales NOT DETECTED NOT DETECTED Final   Enterobacter cloacae complex NOT DETECTED NOT DETECTED Final   Escherichia coli NOT DETECTED NOT DETECTED Final   Klebsiella aerogenes NOT DETECTED NOT DETECTED Final   Klebsiella oxytoca NOT DETECTED NOT DETECTED Final   Klebsiella pneumoniae NOT DETECTED NOT DETECTED Final   Proteus species NOT DETECTED NOT DETECTED Final   Salmonella species NOT DETECTED NOT DETECTED Final   Serratia marcescens NOT DETECTED NOT DETECTED Final   Haemophilus influenzae NOT DETECTED NOT DETECTED Final   Neisseria meningitidis NOT DETECTED NOT DETECTED Final   Pseudomonas aeruginosa NOT DETECTED NOT DETECTED Final   Stenotrophomonas maltophilia NOT DETECTED NOT DETECTED Final   Candida albicans NOT DETECTED NOT DETECTED Final   Candida auris NOT DETECTED NOT DETECTED Final   Candida glabrata NOT DETECTED NOT DETECTED Final   Candida krusei NOT DETECTED NOT DETECTED Final   Candida parapsilosis NOT DETECTED NOT DETECTED Final   Candida tropicalis NOT DETECTED NOT DETECTED Final   Cryptococcus neoformans/gattii NOT DETECTED NOT DETECTED Final    Comment: Performed at [facility], [address], [place], [place] [address]  Aerobic/Anaerobic Culture w Gram Stain (surgical/deep wound)     Status: None   Collection Time: 01/27/24  6:14 PM   Specimen: PATH Soft tissue resection  Result Value Ref Range Status   Specimen Description TISSUE  Final   Special Requests LEFT, 1ST RAY AMP  Final   Gram Stain   Final    NO WBC SEEN ABUNDANT GRAM POSITIVE COCCI MODERATE GRAM NEGATIVE RODS RARE GRAM POSITIVE RODS    Culture   Final    MODERATE ENTEROCOCCUS FAECALIS MODERATE STREPTOCOCCUS GROUP F Beta hemolytic  streptococci are predictably susceptible to penicillin and other beta lactams. Susceptibility testing not routinely performed. FEW PREVOTELLA SPECIES BETA LACTAMASE POSITIVE Performed at [facility], [address] [place], [place] [address]    Report Status 01/30/2024 FINAL  Final   Organism ID, Bacteria ENTEROCOCCUS FAECALIS  Final      Susceptibility   Enterococcus faecalis - MIC*    AMPICILLIN  <=2 SENSITIVE Sensitive     VANCOMYCIN  1 SENSITIVE Sensitive     GENTAMICIN  SYNERGY SENSITIVE Sensitive     * MODERATE ENTEROCOCCUS FAECALIS  Aerobic/Anaerobic Culture w Gram Stain (surgical/deep wound)     Status: None   Collection Time: 01/27/24  6:15 PM   Specimen: PATH Bone resection; Tissue  Result Value Ref Range Status   Specimen Description BONE  Final   Special  Requests LEFT, 1ST RAY BONE  Final   Gram Stain   Final    NO WBC SEEN MODERATE GRAM POSITIVE COCCI MODERATE GRAM NEGATIVE RODS FEW GRAM POSITIVE RODS    Culture   Final    MODERATE ENTEROCOCCUS FAECALIS SUSCEPTIBILITIES PERFORMED ON PREVIOUS CULTURE WITHIN THE LAST 5 DAYS. MODERATE STREPTOCOCCUS GROUP F Beta hemolytic streptococci are predictably susceptible to penicillin and other beta lactams. Susceptibility testing not routinely performed. FEW PREVOTELLA SPECIES BETA LACTAMASE POSITIVE Performed at [facility], [address] [place], [place] [address]    Report Status 01/30/2024 FINAL  Final  Culture, blood (Routine X 2) w Reflex to ID Panel     Status: None   Collection Time: 01/29/24 10:55 AM   Specimen: BLOOD LEFT ARM  Result Value Ref Range Status   Specimen Description BLOOD LEFT ARM  Final   Special Requests   Final    BOTTLES DRAWN AEROBIC AND ANAEROBIC Blood Culture results may not be optimal due to an inadequate volume of blood received in culture bottles   Culture   Final    NO GROWTH 5 DAYS Performed at [facility], [address] [place], [place] [address]    Report  Status 02/03/2024 FINAL  Final  Culture, blood (Routine X 2) w Reflex to ID Panel     Status: None   Collection Time: 01/29/24 10:55 AM   Specimen: BLOOD LEFT ARM  Result Value Ref Range Status   Specimen Description BLOOD LEFT ARM  Final   Special Requests   Final    BOTTLES DRAWN AEROBIC AND ANAEROBIC Blood Culture results may not be optimal due to an inadequate volume of blood received in culture bottles   Culture   Final    NO GROWTH 5 DAYS Performed at [facility], [address], [place], [place] [address]    Report Status 02/03/2024 FINAL  Final         Radiology Studies: EP STUDY Result Date: 02/03/2024 See surgical note for result.  ECHO TEE Result Date: 02/02/2024    TRANSESOPHOGEAL ECHO REPORT   Patient Name:   RYHEEM FERRONI [facility] Date of Exam: 02/02/2024 Medical Rec #:  [id number]       Height:       73.0 in Accession #:    [id number]      Weight:       228.8 lb Date of Birth:  23-May-1976       BSA:          2.278 m Patient Age:    47 years        BP:           156/85 mmHg Patient Gender: M               HR:           93 bpm. Exam Location:  Inpatient Procedure: Transesophageal Echo, Color Doppler and Cardiac Doppler (Both            Spectral and Color Flow Doppler were utilized during procedure). Indications:     endocarditis  History:         Patient has prior history of Echocardiogram examinations, most                  recent 01/31/2024. Signs/Symptoms:Bacteremia; Risk                  Factors:Diabetes and Hypertension.  Sonographer:  Dione Franks RDCS Referring Phys:  [id number] Darline Eis HALEY Diagnosing Phys: Maudine Sos MD PROCEDURE: After discussion of the risks and benefits of a TEE, an informed consent was obtained from the patient. The transesophogeal probe was passed without difficulty through the esophogus of the patient. Imaged were obtained with the patient in a left lateral decubitus position. Local oropharyngeal anesthetic was provided with viscous  lidocaine . Sedation performed by different physician. The patient was monitored while under deep sedation. Anesthestetic sedation was provided intravenously by Anesthesiology: 173mg  of Propofol , 20mg  of Lidocaine . The patient's vital signs; including heart rate, blood pressure, and oxygen saturation; remained stable throughout the procedure. The patient developed no complications during the procedure.  IMPRESSIONS  1. Left ventricular ejection fraction, by estimation, is 60 to 65%. The left ventricle has normal function. The left ventricle has no regional wall motion abnormalities.  2. Right ventricular systolic function is normal. The right ventricular size is normal.  3. No left atrial/left atrial appendage thrombus was detected.  4. The mitral valve is normal in structure. Trivial mitral valve regurgitation. No evidence of mitral stenosis.  5. The aortic valve is tricuspid. Aortic valve regurgitation is not visualized. No aortic stenosis is present. Conclusion(s)/Recommendation(s): Normal biventricular function without evidence of hemodynamically significant valvular heart disease. No evidence of vegetation/infective endocarditis on this transesophageael echocardiogram. FINDINGS  Left Ventricle: Left ventricular ejection fraction, by estimation, is 60 to 65%. The left ventricle has normal function. The left ventricle has no regional wall motion abnormalities. The left ventricular internal cavity size was normal in size. There is  no left ventricular hypertrophy. Right Ventricle: The right ventricular size is normal. No increase in right ventricular wall thickness. Right ventricular systolic function is normal. Left Atrium: Left atrial size was normal in size. No left atrial/left atrial appendage thrombus was detected. Right Atrium: Right atrial size was normal in size. Pericardium: There is no evidence of pericardial effusion. Mitral Valve: The mitral valve is normal in structure. Trivial mitral valve  regurgitation. No evidence of mitral valve stenosis. Tricuspid Valve: The tricuspid valve is normal in structure. Tricuspid valve regurgitation is not demonstrated. No evidence of tricuspid stenosis. Aortic Valve: The aortic valve is tricuspid. Aortic valve regurgitation is not visualized. No aortic stenosis is present. Pulmonic Valve: The pulmonic valve was normal in structure. Pulmonic valve regurgitation is not visualized. No evidence of pulmonic stenosis. Aorta: The aortic root is normal in size and structure. IAS/Shunts: No atrial level shunt detected by color flow Doppler. Maudine Sos MD Electronically signed by Maudine Sos MD Signature Date/Time: 02/02/2024/2:04:27 PM    Final         Scheduled Meds:  acetaminophen   650 mg Rectal Once   amoxicillin -clavulanate  1 tablet Oral Q12H   apixaban   10 mg Oral BID   Followed by   Cecily Cohen ON 02/06/2024] apixaban   5 mg Oral BID   gabapentin   300 mg Oral TID   insulin  aspart  0-15 Units Subcutaneous TID WC   insulin  aspart  0-5 Units Subcutaneous QHS   insulin  aspart  4 Units Subcutaneous TID WC   insulin  glargine-yfgn  20 Units Subcutaneous Q24H   lisinopril   20 mg Oral Daily   mupirocin  ointment   Topical Daily   pneumococcal 20-valent conjugate vaccine  0.5 mL Intramuscular Tomorrow-1000   polyethylene glycol  17 g Oral Daily   senna-docusate  1 tablet Oral QHS   Continuous Infusions:     LOS: 7 days    Time spent: 35  minutes    Hilda Lovings, MD [facility]   To contact the attending provider between 7A-7P or the covering provider during after hours 7P-7A, please log into the web site [email or web address] and access using universal [place] password for that web site. If you do not have the password, please call the hospital operator.  02/03/2024, 11:43 AM

## 2024-02-03 NOTE — Plan of Care (Signed)
  Problem: Health Behavior/Discharge Planning: Goal: Ability to manage health-related needs will improve Outcome: Progressing   Problem: Clinical Measurements: Goal: Ability to maintain clinical measurements within normal limits will improve Outcome: Progressing   Problem: Pain Managment: Goal: General experience of comfort will improve and/or be controlled Outcome: Progressing   Problem: Metabolic: Goal: Ability to maintain appropriate glucose levels will improve Outcome: Progressing

## 2024-02-03 NOTE — Plan of Care (Signed)

## 2024-02-03 NOTE — Progress Notes (Signed)
 Mobility Specialist Progress Note:    02/03/24 1035  Mobility  Activity Transferred from bed to chair  Level of Assistance Contact guard assist, steadying assist  Assistive Device Front wheel walker  Distance Ambulated (ft) 4 ft  LLE Weight Bearing Per Provider Order PWB  Activity Response Tolerated well  Mobility Referral Yes  Mobility visit 1 Mobility  Mobility Specialist Start Time (ACUTE ONLY) 1025  Mobility Specialist Stop Time (ACUTE ONLY) 1030  Mobility Specialist Time Calculation (min) (ACUTE ONLY) 5 min   Pt received in bed, agreeable to transfer B>C via RW and CGA. Tolerated well, no complaints. PWB on L heel. Left pt in chair with all needs met, call bell and phone in reach.   Deandrae Wajda Mobility Specialist Please contact via Special educational needs teacher or  Rehab office at [phone]

## 2024-02-03 NOTE — Plan of Care (Signed)

## 2024-02-04 ENCOUNTER — Encounter (HOSPITAL_COMMUNITY): Payer: Self-pay | Admitting: Cardiovascular Disease

## 2024-02-04 DIAGNOSIS — M869 Osteomyelitis, unspecified: Secondary | ICD-10-CM | POA: Diagnosis not present

## 2024-02-04 DIAGNOSIS — A480 Gas gangrene: Secondary | ICD-10-CM | POA: Diagnosis not present

## 2024-02-04 DIAGNOSIS — E119 Type 2 diabetes mellitus without complications: Secondary | ICD-10-CM | POA: Diagnosis not present

## 2024-02-04 DIAGNOSIS — R110 Nausea: Secondary | ICD-10-CM

## 2024-02-04 LAB — GLUCOSE, CAPILLARY
Glucose-Capillary: 284 mg/dL — ABNORMAL HIGH (ref 70–99)
Glucose-Capillary: 331 mg/dL — ABNORMAL HIGH (ref 70–99)
Glucose-Capillary: 261 mg/dL — ABNORMAL HIGH (ref 70–99)
Glucose-Capillary: 253 mg/dL — ABNORMAL HIGH (ref 70–99)

## 2024-02-04 LAB — BASIC METABOLIC PANEL WITH GFR
Anion gap: 11 (ref 5–15)
BUN: 5 mg/dL — ABNORMAL LOW (ref 6–20)
CO2: 27 mmol/L (ref 22–32)
Calcium: 9.3 mg/dL (ref 8.9–10.3)
Chloride: 94 mmol/L — ABNORMAL LOW (ref 98–111)
Creatinine, Ser: 0.68 mg/dL (ref 0.61–1.24)
GFR, Estimated: >60 mL/min (ref >60)
Glucose, Bld: 288 mg/dL — ABNORMAL HIGH (ref 70–99)
Potassium: 4 mmol/L (ref 3.5–5.1)
Sodium: 132 mmol/L — ABNORMAL LOW (ref 135–145)

## 2024-02-04 LAB — CBC
HCT: 38.3 % — ABNORMAL LOW (ref 39.0–52.0)
Hemoglobin: 12.4 g/dL — ABNORMAL LOW (ref 13.0–17.0)
MCH: 29 pg (ref 26.0–34.0)
MCHC: 32.4 g/dL (ref 30.0–36.0)
MCV: 89.5 fL (ref 80.0–100.0)
Platelets: 551 10*3/uL — ABNORMAL HIGH (ref 150–400)
RBC: 4.28 MIL/uL (ref 4.22–5.81)
RDW: 13.2 % (ref 11.5–15.5)
WBC: 12.7 10*3/uL — ABNORMAL HIGH (ref 4.0–10.5)
nRBC: 0 % (ref 0.0–0.2)

## 2024-02-04 MED ORDER — METOCLOPRAMIDE HCL 5 MG/ML IJ SOLN
5.0000 mg | Freq: Four times a day (QID) | INTRAMUSCULAR | Status: AC
  Administered 2024-02-04 (×3): 5 mg via INTRAVENOUS
  Filled 2024-02-04 (×3): qty 2

## 2024-02-04 MED ORDER — INSULIN GLARGINE-YFGN 100 UNIT/ML ~~LOC~~ SOLN
25.0000 [IU] | SUBCUTANEOUS | Status: DC
  Administered 2024-02-04 – 2024-02-05 (×2): 25 [IU] via SUBCUTANEOUS
  Filled 2024-02-04 (×2): qty 0.25

## 2024-02-04 MED ORDER — INSULIN ASPART 100 UNIT/ML IJ SOLN
8.0000 [IU] | Freq: Three times a day (TID) | INTRAMUSCULAR | Status: DC
  Administered 2024-02-04 – 2024-02-05 (×5): 8 [IU] via SUBCUTANEOUS

## 2024-02-04 NOTE — Plan of Care (Signed)
  Problem: Health Behavior/Discharge Planning: Goal: Ability to manage health-related needs will improve Outcome: Progressing   Problem: Clinical Measurements: Goal: Ability to maintain clinical measurements within normal limits will improve Outcome: Progressing   Problem: Nutrition: Goal: Adequate nutrition will be maintained Outcome: Progressing   Problem: Metabolic: Goal: Ability to maintain appropriate glucose levels will improve Outcome: Progressing

## 2024-02-04 NOTE — Plan of Care (Signed)

## 2024-02-04 NOTE — Progress Notes (Signed)
 PROGRESS NOTE    ANIV GELL  WUJ:[id number] DOB: 06/24/1976 DOA: 01/27/2024 PCP: Alejandro Hurt, FNP    Chief Complaint  Patient presents with   Foot Pain   Wound Infection   Code Sepsis    Brief Narrative:   Dakota Becker is a 48 y.o. male with medical history significant of diabetes, essential hypertension, hyperlipidemia, with great toe ulcer developed some blisters.  Not taking his diabetic medication.  He went to PCP and was put on oral antibiotics and referred to podiatry.  Recommended wound management but he never got contacted so started having increasing swelling in the left toe with pain for 5 days or so with some chills and nausea ,vomiting and foul odor.  Patient then presented to [facility].  Upon arrival to the ED podiatry was consulted.  Patient  met sepsis criteria secondary to osteomyelitis of the left great toe.  Patient was then taken to the OR and underwent left hallux amputation.  Patient underwent repeat debridement on 01/29/2024 by podiatry and ID  has been consulted for antibiotic management..    Assessment & Plan:   Principal Problem:   Toe osteomyelitis, left (HCC) Active Problems:   Gas gangrene of foot (HCC)   Gangrene of toe of left foot (HCC)   Controlled type 2 diabetes mellitus without complication, without long-term current use of insulin  (HCC)   Hypercalcemia   Constipation   Hypertension   Acute deep vein thrombosis (DVT) of left lower extremity (HCC)   Type 2 diabetes mellitus without complication, without long-term current use of insulin  (HCC)   Bacteremia  #1 sepsis secondary to gas gangrene of the left great toe with osteomyelitis/Streptococcus bacteremia -Patient seen in consultation by podiatry and underwent surgical intervention on admission with partial first ray amputation of the left foot and second toe amputation at MPJ level of the left foot with application of dissolvable antibiotic beads of the left foot. -  Patient followed by podiatry underwent repeat irrigation and debridement with allograft application with wound VAC treatment on 01/29/2024. - Per podiatry patient high risk for need for revision including transmetatarsal amputation. - ID consulted and patient currently on linezolid  and Unasyn . - NWB per podiatry. - Blood cultures from 01/27/2024 with Streptococcus constellatus with repeat blood cultures from 01/29/2024 with no growth to date. - Wound cultures with Enterococcus and Streptococcus noted. - Patient with a leukocytosis which has started to trend down. - Fever curve has trended down. - 2D echo with a EF of 60 to 65%, NWMA, grade 1 diastolic dysfunction, no vegetations noted. -ID recommended TEE which was done this morning to 02/02/2024 with a EF of 60 to 65%, no LA/LAA thrombus or masses noted, no evidence of endocarditis.  - Was on continue Unasyn , linezolid . -ID changed antibiotics to Augmentin  to complete 6 weeks from negative blood culture with EOT 03/10/2024. -ID was following but has signed off as of 02/02/2024. -Podiatry with no further plans to go to the OR, recommending to transition to heel WB in postop shoe for short distance transfers and will need outpatient follow-up 2 weeks postdischarge. - Podiatry was following but have signed off as of 02/02/2024.  2.  Left lower extremity DVT -Patient started on Eliquis  for anticoagulation as no further surgical intervention planned at this time.  3.  Nausea/decreased appetite - Patient with some nausea, some abdominal discomfort, feels food is sitting in his abdomen and not moving further down.   - Patient with history of poorly  controlled diabetes mellitus, patient states symptoms at started prior to admission.   - Placed on Reglan 5 mg IV every 6 hours x 3 doses.   - Check a gastric emptying study.   4.  Constipation - Patient placed on MiraLAX  twice daily, Senokot-S twice daily and received sorbitol  x 2 doses as well as Dulcolax  suppository with good results.   - Patient was having bowel movements however states has not had bowel movement over the past 24 hours and complaining of some mid abdominal discomfort and nausea.   -Increase MiraLAX  back to twice daily, increase Senokot-S back to twice daily.   - Place on IV Reglan 5 mg every 6 hours x 3 doses due to concerns for possible gastroparesis pending gastric emptying study.  - Follow.  5.  Diabetes mellitus type 2 -Hemoglobin A1c 9.8. - CBG 284 this morning. - Patient noted to be on glipizide, Jardiance, semaglutide prior to admission. - Increase Semglee  to 25 units daily.   - Increase meal coverage NovoLog  to 8 units 3 times daily with meals.   - SSI.  - Diabetes coordinator following.  6.  Hypertension - BP stable.   - Lisinopril  20 mg daily.   7.  Class I obesity -BMI of 32.84 kg/m. -Lifestyle modification - Outpatient follow-up with PCP.  8.  Hypercalcemia -Resolved.   DVT prophylaxis: Eliquis  Code Status: Full Family Communication: Updated patient.  No family at bedside. Disposition: SNF when bed available.  Status is: Inpatient Remains inpatient appropriate because: Severity of illness   Consultants:  Podiatry: Dr. Rosemarie Conquest 01/27/2024 ID: Dr. Zelda Hickman 01/29/2024 Wound care RN  Procedures: 2D echo 01/31/2024 CT left ankle and left foot 01/27/2024 Plain films of the left foot 01/27/2024, 01/29/2024 Lower extremity Dopplers 01/30/2024 Lower extremity arterial duplex 01/30/2024 Partial first ray amputation left foot/second toe amputation at MPJ level of the left foot/application of dissolvable antibiotic beads left foot per podiatry: Dr. Demetria Finch 01/27/2024 Repeat irrigation and debridement partial second metatarsal resection, left foot/dermal allograft application 5 x 4 cm left foot/application negative pressure wound therapy 5/4 cm, left foot per podiatry: Dr. Rosemarie Conquest 01/29/2024 TEE 02/02/2024  Antimicrobials: Anti-infectives (From admission, onward)     Start     Dose/Rate Route Frequency Ordered Stop   02/02/24 2200  amoxicillin -clavulanate (AUGMENTIN ) 875-125 MG per tablet 1 tablet        1 tablet Oral Every 12 hours 02/02/24 1533 03/12/24 0959   01/30/24 1200  Ampicillin -Sulbactam (UNASYN ) 3 g in sodium chloride  0.9 % 100 mL IVPB  Status:  Discontinued        3 g 200 mL/hr over 30 Minutes Intravenous Every 6 hours 01/30/24 1103 02/02/24 1533   01/29/24 2200  linezolid  (ZYVOX ) tablet 600 mg  Status:  Discontinued        600 mg Oral Every 12 hours 01/29/24 1040 02/02/24 1530   01/29/24 1130  cefTRIAXone  (ROCEPHIN ) 2 g in sodium chloride  0.9 % 100 mL IVPB  Status:  Discontinued        2 g 200 mL/hr over 30 Minutes Intravenous Every 24 hours 01/29/24 1036 01/30/24 1103   01/28/24 1130  piperacillin -tazobactam (ZOSYN ) IVPB 3.375 g  Status:  Discontinued        3.375 g 12.5 mL/hr over 240 Minutes Intravenous Every 8 hours 01/28/24 1035 01/29/24 1036   01/28/24 1000  metroNIDAZOLE  (FLAGYL ) IVPB 500 mg  Status:  Discontinued        500 mg 100 mL/hr over 60 Minutes Intravenous Every 12  hours 01/28/24 0925 01/28/24 1035   01/27/24 2200  linezolid  (ZYVOX ) IVPB 600 mg  Status:  Discontinued        600 mg 300 mL/hr over 60 Minutes Intravenous Every 12 hours 01/27/24 1953 01/27/24 2007   01/27/24 1835  vancomycin  (VANCOCIN ) powder  Status:  Discontinued          As needed 01/27/24 1835 01/27/24 1843   01/27/24 1834  tobramycin  (NEBCIN ) injection  Status:  Discontinued          As needed 01/27/24 1835 01/27/24 1843   01/27/24 1515  linezolid  (ZYVOX ) IVPB 600 mg  Status:  Discontinued        600 mg 300 mL/hr over 60 Minutes Intravenous Every 12 hours 01/27/24 1502 01/29/24 1036   01/27/24 1515  piperacillin -tazobactam (ZOSYN ) IVPB 3.375 g        3.375 g 100 mL/hr over 30 Minutes Intravenous  Once 01/27/24 1505 01/27/24 1635   01/27/24 1430  vancomycin  (VANCOCIN ) IVPB 1000 mg/200 mL premix  Status:  Discontinued       Placed in "Followed by"  Linked Group   1,000 mg 200 mL/hr over 60 Minutes Intravenous  Once 01/27/24 1419 01/27/24 1502   01/27/24 1430  vancomycin  (VANCOCIN ) IVPB 1000 mg/200 mL premix  Status:  Discontinued       Placed in "Followed by" Linked Group   1,000 mg 200 mL/hr over 60 Minutes Intravenous  Once 01/27/24 1419 01/27/24 1502   01/27/24 1415  Ampicillin -Sulbactam (UNASYN ) 3 g in sodium chloride  0.9 % 100 mL IVPB  Status:  Discontinued       Placed in "And" Linked Group   3 g 200 mL/hr over 30 Minutes Intravenous  Once 01/27/24 1412 01/27/24 1635   01/27/24 1415  vancomycin  (VANCOCIN ) IVPB 1000 mg/200 mL premix  Status:  Discontinued       Placed in "And" Linked Group   1,000 mg 200 mL/hr over 60 Minutes Intravenous  Once 01/27/24 1412 01/27/24 1419         Subjective: Patient sitting up in recliner.  Feels his food is just sitting and not making its way down.  Stated has not had a bowel movement.  Feeling somewhat constipated.  Some nausea.  No emesis.  Wife at bedside.   Objective: Vitals:   02/03/24 2221 02/04/24 0118 02/04/24 0441 02/04/24 0804  BP: (!) 144/80 (!) 144/81 (!) 140/87 136/87  Pulse: 98 87 90 92  Resp:    20  Temp: 99.5 F (37.5 C) 99 F (37.2 C) 99 F (37.2 C) 98 F (36.7 C)  TempSrc: Oral Oral Oral Oral  SpO2: 94% 100% 98% 96%  Weight:      Height:        Intake/Output Summary (Last 24 hours) at 02/04/2024 1250 Last data filed at 02/03/2024 2027 Gross per 24 hour  Intake --  Output 700 ml  Net -700 ml   Filed Weights   01/30/24 0421 01/31/24 0425 02/02/24 0532  Weight: 112.9 kg 104.2 kg 103.8 kg    Examination:  General exam: NAD Respiratory system: Lungs clear to auscultation bilaterally.  No wheezes, no crackles, no rhonchi.  Fair air movement.  Speaking in full sentences. Cardiovascular system: RRR no murmurs rubs or gallops.  No JVD.  No lower extremity edema.  Gastrointestinal system: Abdomen is soft, some mid abdominal discomfort, obese, positive bowel  sounds.  No rebound.  No guarding.  Central nervous system: Alert and oriented. No focal neurological deficits. Extremities:  Left foot in postop bandage with wound VAC in place.  Skin: No rashes, lesions or ulcers Psychiatry: Judgement and insight appear normal. Mood & affect appropriate.     Data Reviewed: I have personally reviewed following labs and imaging studies  CBC: Recent Labs  Lab 01/31/24 0241 02/01/24 0250 02/02/24 0244 02/03/24 0314 02/04/24 0243  WBC 15.9* 15.0* 12.0* 11.8* 12.7*  NEUTROABS  --  11.7* 9.2*  --   --   HGB 11.9* 12.0* 11.4* 11.7* 12.4*  HCT 36.4* 36.9* 35.3* 35.9* 38.3*  MCV 88.3 89.3 88.9 89.1 89.5  PLT 423* 443* 447* 448* 551*    Basic Metabolic Panel: Recent Labs  Lab 01/29/24 0252 01/30/24 0357 01/31/24 0241 02/01/24 0250 02/02/24 0244 02/03/24 0314 02/04/24 0243  NA 133* 134* 135 134* 134* 134* 132*  K 4.0 4.1 4.1 4.1 3.8 4.0 4.0  CL 99 99 98 97* 97* 98 94*  CO2 21* 20* 23 24 26 27 27   GLUCOSE 174* 204* 206* 229* 255* 296* 288*  BUN 9 7 9 9 9 6  5*  CREATININE 0.91 0.89 0.84 0.76 0.70 0.74 0.68  CALCIUM 9.1 9.1 9.1 9.2 8.8* 8.9 9.3  MG 1.8  --  2.0 2.0 1.9 1.9  --   PHOS  --  3.3  --   --   --   --   --     GFR: Estimated Creatinine Clearance: 141.4 mL/min (by C-G formula based on SCr of 0.68 mg/dL).  Liver Function Tests: No results for input(s): "AST", "ALT", "ALKPHOS", "BILITOT", "PROT", "ALBUMIN" in the last 168 hours.   CBG: Recent Labs  Lab 02/03/24 1607 02/03/24 2105 02/03/24 2222 02/04/24 0626 02/04/24 1149  GLUCAP 256* 220* 232* 284* 331*     Recent Results (from the past 240 hours)  Blood Cultures x 2 sites     Status: Abnormal   Collection Time: 01/27/24  2:55 PM   Specimen: BLOOD LEFT FOREARM  Result Value Ref Range Status   Specimen Description BLOOD LEFT FOREARM  Final   Special Requests   Final    Blood Culture adequate volume BOTTLES DRAWN AEROBIC AND ANAEROBIC   Culture  Setup Time   Final     GRAM POSITIVE COCCI IN BOTH AEROBIC AND ANAEROBIC BOTTLES CRITICAL VALUE NOTED.  VALUE IS CONSISTENT WITH PREVIOUSLY REPORTED AND CALLED VALUE.    Culture (A)  Final    STREPTOCOCCUS CONSTELLATUS SUSCEPTIBILITIES PERFORMED ON PREVIOUS CULTURE WITHIN THE LAST 5 DAYS. Performed at [facility], [address] [place], [place] [address]    Report Status 01/30/2024 FINAL  Final  Blood Cultures x 2 sites     Status: Abnormal   Collection Time: 01/27/24  2:56 PM   Specimen: BLOOD  Result Value Ref Range Status   Specimen Description   Final    BLOOD RIGHT ANTECUBITAL Performed at [facility], [address], [place], [place] [address]    Special Requests   Final    Blood Culture adequate volume BOTTLES DRAWN AEROBIC AND ANAEROBIC   Culture  Setup Time   Final    GRAM POSITIVE COCCI IN CHAINS IN BOTH AEROBIC AND ANAEROBIC BOTTLES CRITICAL RESULT CALLED TO, READ BACK BY AND VERIFIED WITH: PHARMD Normie Becton [id number] AT 1958, ADC Performed at [facility], [address], [place], [place] [address]    Culture STREPTOCOCCUS CONSTELLATUS (A)  Final   Report Status 01/30/2024 FINAL  Final   Organism ID, Bacteria STREPTOCOCCUS CONSTELLATUS  Final      Susceptibility   Streptococcus constellatus - MIC*    PENICILLIN <=0.06 SENSITIVE Sensitive     CEFTRIAXONE  0.25 SENSITIVE Sensitive     ERYTHROMYCIN <=0.12 SENSITIVE Sensitive     LEVOFLOXACIN 0.5 SENSITIVE Sensitive     VANCOMYCIN  0.5 SENSITIVE Sensitive     * STREPTOCOCCUS CONSTELLATUS  Blood Culture ID Panel (Reflexed)     Status: Abnormal   Collection Time: 01/27/24  2:56 PM  Result Value Ref Range Status   Enterococcus faecalis NOT DETECTED NOT DETECTED Final   Enterococcus Faecium NOT DETECTED NOT DETECTED Final   Listeria monocytogenes NOT DETECTED NOT DETECTED Final   Staphylococcus species NOT DETECTED NOT DETECTED Final   Staphylococcus aureus (BCID) NOT DETECTED NOT DETECTED Final   Staphylococcus  epidermidis NOT DETECTED NOT DETECTED Final   Staphylococcus lugdunensis NOT DETECTED NOT DETECTED Final   Streptococcus species DETECTED (A) NOT DETECTED Final    Comment: Not Enterococcus species, Streptococcus agalactiae, Streptococcus pyogenes, or Streptococcus pneumoniae. CRITICAL RESULT CALLED TO, READ BACK BY AND VERIFIED WITH: PHARMD TONY R. [id number] AT 1958, ADC    Streptococcus agalactiae NOT DETECTED NOT DETECTED Final   Streptococcus pneumoniae NOT DETECTED NOT DETECTED Final   Streptococcus pyogenes NOT DETECTED NOT DETECTED Final   A.calcoaceticus-baumannii NOT DETECTED NOT DETECTED Final   Bacteroides fragilis NOT DETECTED NOT DETECTED Final   Enterobacterales NOT DETECTED NOT DETECTED Final   Enterobacter cloacae complex NOT DETECTED NOT DETECTED Final   Escherichia coli NOT DETECTED NOT DETECTED Final   Klebsiella aerogenes NOT DETECTED NOT DETECTED Final   Klebsiella oxytoca NOT DETECTED NOT DETECTED Final   Klebsiella pneumoniae NOT DETECTED NOT DETECTED Final   Proteus species NOT DETECTED NOT DETECTED Final   Salmonella species NOT DETECTED NOT DETECTED Final   Serratia marcescens NOT DETECTED NOT DETECTED Final   Haemophilus influenzae NOT DETECTED NOT DETECTED Final   Neisseria meningitidis NOT DETECTED NOT DETECTED Final   Pseudomonas aeruginosa NOT DETECTED NOT DETECTED Final   Stenotrophomonas maltophilia NOT DETECTED NOT DETECTED Final   Candida albicans NOT DETECTED NOT DETECTED Final   Candida auris NOT DETECTED NOT DETECTED Final   Candida glabrata NOT DETECTED NOT DETECTED Final   Candida krusei NOT DETECTED NOT DETECTED Final   Candida parapsilosis NOT DETECTED NOT DETECTED Final   Candida tropicalis NOT DETECTED NOT DETECTED Final   Cryptococcus neoformans/gattii NOT DETECTED NOT DETECTED Final    Comment: Performed at [facility], [address] [place], [place] [address]  Aerobic/Anaerobic Culture w Gram Stain (surgical/deep wound)     Status:  None   Collection Time: 01/27/24  6:14 PM   Specimen: PATH Soft tissue resection  Result Value Ref Range Status   Specimen Description TISSUE  Final   Special Requests LEFT, 1ST RAY AMP  Final   Gram Stain   Final    NO WBC SEEN ABUNDANT GRAM POSITIVE COCCI MODERATE GRAM NEGATIVE RODS RARE GRAM POSITIVE RODS    Culture   Final    MODERATE ENTEROCOCCUS FAECALIS MODERATE STREPTOCOCCUS GROUP F Beta hemolytic streptococci are predictably susceptible to penicillin and other beta lactams. Susceptibility testing not routinely performed. FEW PREVOTELLA SPECIES BETA LACTAMASE POSITIVE Performed at [facility], [address] [place], [place] [address]    Report Status 01/30/2024 FINAL  Final   Organism ID, Bacteria ENTEROCOCCUS FAECALIS  Final      Susceptibility   Enterococcus faecalis - MIC*    AMPICILLIN  <=2 SENSITIVE Sensitive  VANCOMYCIN  1 SENSITIVE Sensitive     GENTAMICIN  SYNERGY SENSITIVE Sensitive     * MODERATE ENTEROCOCCUS FAECALIS  Aerobic/Anaerobic Culture w Gram Stain (surgical/deep wound)     Status: None   Collection Time: 01/27/24  6:15 PM   Specimen: PATH Bone resection; Tissue  Result Value Ref Range Status   Specimen Description BONE  Final   Special Requests LEFT, 1ST RAY BONE  Final   Gram Stain   Final    NO WBC SEEN MODERATE GRAM POSITIVE COCCI MODERATE GRAM NEGATIVE RODS FEW GRAM POSITIVE RODS    Culture   Final    MODERATE ENTEROCOCCUS FAECALIS SUSCEPTIBILITIES PERFORMED ON PREVIOUS CULTURE WITHIN THE LAST 5 DAYS. MODERATE STREPTOCOCCUS GROUP F Beta hemolytic streptococci are predictably susceptible to penicillin and other beta lactams. Susceptibility testing not routinely performed. FEW PREVOTELLA SPECIES BETA LACTAMASE POSITIVE Performed at [facility], [address] [place], [place] [address]    Report Status 01/30/2024 FINAL  Final  Culture, blood (Routine X 2) w Reflex to ID Panel     Status: None   Collection Time: 01/29/24  10:55 AM   Specimen: BLOOD LEFT ARM  Result Value Ref Range Status   Specimen Description BLOOD LEFT ARM  Final   Special Requests   Final    BOTTLES DRAWN AEROBIC AND ANAEROBIC Blood Culture results may not be optimal due to an inadequate volume of blood received in culture bottles   Culture   Final    NO GROWTH 5 DAYS Performed at [facility], [address], [place], [place] [address]    Report Status 02/03/2024 FINAL  Final  Culture, blood (Routine X 2) w Reflex to ID Panel     Status: None   Collection Time: 01/29/24 10:55 AM   Specimen: BLOOD LEFT ARM  Result Value Ref Range Status   Specimen Description BLOOD LEFT ARM  Final   Special Requests   Final    BOTTLES DRAWN AEROBIC AND ANAEROBIC Blood Culture results may not be optimal due to an inadequate volume of blood received in culture bottles   Culture   Final    NO GROWTH 5 DAYS Performed at [facility], [address], [place], [place] [address]    Report Status 02/03/2024 FINAL  Final         Radiology Studies: No results found.       Scheduled Meds:  acetaminophen   650 mg Rectal Once   amoxicillin -clavulanate  1 tablet Oral Q12H   apixaban   10 mg Oral BID   Followed by   Cecily Cohen ON 02/06/2024] apixaban   5 mg Oral BID   gabapentin   300 mg Oral TID   insulin  aspart  0-15 Units Subcutaneous TID WC   insulin  aspart  0-5 Units Subcutaneous QHS   insulin  aspart  8 Units Subcutaneous TID WC   insulin  glargine-yfgn  25 Units Subcutaneous Q24H   lisinopril   20 mg Oral Daily   mupirocin  ointment   Topical Daily   pneumococcal 20-valent conjugate vaccine  0.5 mL Intramuscular Tomorrow-1000   polyethylene glycol  17 g Oral Daily   senna-docusate  1 tablet Oral QHS   Continuous Infusions:     LOS: 8 days    Time spent: 35 minutes    Hilda Lovings, MD [facility]   To contact the attending provider between 7A-7P or the covering provider during after hours 7P-7A, please log into  the web site [email or web address] and access using universal  [place] password for that web site. If you do not have the password, please call the hospital operator.  02/04/2024, 12:50 PM

## 2024-02-04 NOTE — Anesthesia Postprocedure Evaluation (Signed)
 Anesthesia Post Note  Patient: Dakota Becker  Procedure(s) Performed: TRANSESOPHAGEAL ECHOCARDIOGRAM     Patient location during evaluation: Cath Lab Anesthesia Type: MAC Level of consciousness: awake Pain management: pain level controlled Vital Signs Assessment: post-procedure vital signs reviewed and stable Respiratory status: spontaneous breathing, nonlabored ventilation and respiratory function stable Cardiovascular status: blood pressure returned to baseline and stable Postop Assessment: no apparent nausea or vomiting Anesthetic complications: no   No notable events documented.  Last Vitals:  Vitals:   02/04/24 0441 02/04/24 0804  BP: (!) 140/87 136/87  Pulse: 90 92  Resp:  20  Temp: 37.2 C 36.7 C  SpO2: 98% 96%    Last Pain:  Vitals:   02/04/24 0804  TempSrc: Oral  PainSc:                  [name] [name]

## 2024-02-05 DIAGNOSIS — E119 Type 2 diabetes mellitus without complications: Secondary | ICD-10-CM | POA: Diagnosis not present

## 2024-02-05 DIAGNOSIS — S91302A Unspecified open wound, left foot, initial encounter: Secondary | ICD-10-CM | POA: Diagnosis not present

## 2024-02-05 DIAGNOSIS — M869 Osteomyelitis, unspecified: Secondary | ICD-10-CM | POA: Diagnosis not present

## 2024-02-05 DIAGNOSIS — T8140XA Infection following a procedure, unspecified, initial encounter: Secondary | ICD-10-CM | POA: Diagnosis not present

## 2024-02-05 DIAGNOSIS — I96 Gangrene, not elsewhere classified: Secondary | ICD-10-CM | POA: Diagnosis not present

## 2024-02-05 LAB — GLUCOSE, CAPILLARY
Glucose-Capillary: 257 mg/dL — ABNORMAL HIGH (ref 70–99)
Glucose-Capillary: 199 mg/dL — ABNORMAL HIGH (ref 70–99)
Glucose-Capillary: 208 mg/dL — ABNORMAL HIGH (ref 70–99)
Glucose-Capillary: 213 mg/dL — ABNORMAL HIGH (ref 70–99)
Glucose-Capillary: 270 mg/dL — ABNORMAL HIGH (ref 70–99)

## 2024-02-05 LAB — BASIC METABOLIC PANEL WITH GFR
Anion gap: 12 (ref 5–15)
BUN: 8 mg/dL (ref 6–20)
CO2: 29 mmol/L (ref 22–32)
Calcium: 9.4 mg/dL (ref 8.9–10.3)
Chloride: 93 mmol/L — ABNORMAL LOW (ref 98–111)
Creatinine, Ser: 0.87 mg/dL (ref 0.61–1.24)
GFR, Estimated: >60 mL/min (ref >60)
Glucose, Bld: 255 mg/dL — ABNORMAL HIGH (ref 70–99)
Potassium: 3.8 mmol/L (ref 3.5–5.1)
Sodium: 134 mmol/L — ABNORMAL LOW (ref 135–145)

## 2024-02-05 LAB — CBC
HCT: 38.2 % — ABNORMAL LOW (ref 39.0–52.0)
Hemoglobin: 12.3 g/dL — ABNORMAL LOW (ref 13.0–17.0)
MCH: 29 pg (ref 26.0–34.0)
MCHC: 32.2 g/dL (ref 30.0–36.0)
MCV: 90.1 fL (ref 80.0–100.0)
Platelets: 538 10*3/uL — ABNORMAL HIGH (ref 150–400)
RBC: 4.24 MIL/uL (ref 4.22–5.81)
RDW: 13.1 % (ref 11.5–15.5)
WBC: 11.9 10*3/uL — ABNORMAL HIGH (ref 4.0–10.5)
nRBC: 0 % (ref 0.0–0.2)

## 2024-02-05 MED ORDER — INSULIN GLARGINE-YFGN 100 UNIT/ML ~~LOC~~ SOLN
30.0000 [IU] | SUBCUTANEOUS | Status: DC
  Administered 2024-02-06 – 2024-02-08 (×3): 30 [IU] via SUBCUTANEOUS
  Filled 2024-02-05 (×3): qty 0.3

## 2024-02-05 MED ORDER — PANTOPRAZOLE SODIUM 40 MG PO TBEC
40.0000 mg | DELAYED_RELEASE_TABLET | Freq: Every day | ORAL | Status: DC
  Administered 2024-02-05 – 2024-02-08 (×4): 40 mg via ORAL
  Filled 2024-02-05 (×4): qty 1

## 2024-02-05 MED ORDER — SIMETHICONE 80 MG PO CHEW
160.0000 mg | CHEWABLE_TABLET | Freq: Four times a day (QID) | ORAL | Status: DC
  Administered 2024-02-05 – 2024-02-08 (×13): 160 mg via ORAL
  Filled 2024-02-05 (×13): qty 2

## 2024-02-05 MED ORDER — METOCLOPRAMIDE HCL 5 MG/ML IJ SOLN
5.0000 mg | Freq: Four times a day (QID) | INTRAMUSCULAR | Status: AC
  Administered 2024-02-05 – 2024-02-06 (×3): 5 mg via INTRAVENOUS
  Filled 2024-02-05 (×3): qty 2

## 2024-02-05 NOTE — TOC Progression Note (Addendum)
 Transition of Care [facility]) - Progression Note    Patient Details  Name: Dakota Becker MRN: [id number] Date of Birth: 03/05/1976  [facility] CM/SW Contact  Jennett Model, RN Phone Number: 02/05/2024, 2:56 PM  Clinical Narrative:    NCM spoke with patient, and informed him of the information that his insurance does not cover SNF benefits,  and they can not afford to pay privately,  NCM spoke with Sherrlyn Dolores with [employer] for wound vac, it is ready when patient needs it, NCM will just need to give her a call.  When NCM spoke with patient and  before he decided to go to SNF he did not have a preference of the [facility] agency and Loetta Ringer with [facility] stated she could take referral.  She is still able to take referral for HHRN, HHPT.  With wound vac change on Tues and Friday.  Lindbergh Reusing with  [employer] will provide wound Vac.  Wound Vac is in the patient's room.   Expected Discharge Plan: Skilled Nursing Facility Barriers to Discharge: Continued Medical Work up, English as a second language teacher, Other (must enter comment) (Facility will do auth and order wound vac)  Expected Discharge Plan and Services In-house Referral: Clinical Social Work     Living arrangements for the past 2 months: Single Family Home                                       Social Determinants of Health (SDOH) Interventions SDOH Screenings   Food Insecurity: No Food Insecurity (01/27/2024)  Housing: Low Risk  (01/27/2024)  Transportation Needs: No Transportation Needs (01/27/2024)  Utilities: Not At Risk (01/27/2024)  Social Connections: Socially Integrated (01/27/2024)  Tobacco Use: Low Risk  (02/02/2024)    Readmission Risk Interventions     No data to display

## 2024-02-05 NOTE — TOC Progression Note (Addendum)
 Transition of Care [facility]) - Progression Note    Patient Details  Name: Dakota Becker MRN: [id number] Date of Birth: [birth year]  Transition of [facility]) CM/SW Contact  Arron Big, [place] Phone Number: 02/05/2024, 11:28 AM  Clinical Narrative:   CSW met with patient and family at bedside to discuss choice of SNF. CSW informed patient that [facility] has declined at this time due to patients age. Patient stated he would like to go to [facility] for SNF. CSW reached out to [place] w/ [facility] to confirm bed availability. If [facility] can accept, facility will need to complete insurance auth and order wound vac for patient.   11:28 AM [facility] confirmed bed availability. CSW informed Bertell Broach that facility will need to start auth and order patient a wound vac.    11:50 AM Tanya informed CSW that facility is starting [place]. CSW informed her of expected DC date.   2:15 PM Facility reached out to CSW to inform that patient does not have SNF benefits with his insurance plan. CSW notified treatment team and patients spouse.  CSW inquired with spouse about private paying for SNF, she stated they cannot do that at this time.   TOC will continue to follow.    Expected Discharge Plan: Skilled Nursing Facility Barriers to Discharge: Continued Medical Work up, English as a second language teacher, Other (must enter comment) (Facility will do auth and order wound vac)  Expected Discharge Plan and Services In-house Referral: Clinical Social Work     Living arrangements for the past 2 months: Single Family Home                                       Social Determinants of Health (SDOH) Interventions SDOH Screenings   Food Insecurity: No Food Insecurity (01/27/2024)  Housing: Low Risk  (01/27/2024)  Transportation Needs: No Transportation Needs (01/27/2024)  Utilities: Not At Risk (01/27/2024)  Social Connections: Socially Integrated (01/27/2024)  Tobacco Use: Low Risk  (02/02/2024)    Readmission Risk  Interventions     No data to display

## 2024-02-05 NOTE — Progress Notes (Signed)
 PROGRESS NOTE    Dakota Becker  ZOX:[id number] DOB: Oct 20, 1975 DOA: 01/27/2024 PCP: Alejandro Hurt, FNP    Chief Complaint  Patient presents with   Foot Pain   Wound Infection   Code Sepsis    Brief Narrative:   Dakota Becker is a 48 y.o. male with medical history significant of diabetes, essential hypertension, hyperlipidemia, with great toe ulcer developed some blisters.  Not taking his diabetic medication.  He went to PCP and was put on oral antibiotics and referred to podiatry.  Recommended wound management but he never got contacted so started having increasing swelling in the left toe with pain for 5 days or so with some chills and nausea ,vomiting and foul odor.  Patient then presented to [facility].  Upon arrival to the ED podiatry was consulted.  Patient  met sepsis criteria secondary to osteomyelitis of the left great toe.  Patient was then taken to the OR and underwent left hallux amputation.  Patient underwent repeat debridement on 01/29/2024 by podiatry and ID  has been consulted for antibiotic management..    Assessment & Plan:   Principal Problem:   Toe osteomyelitis, left (HCC) Active Problems:   Gas gangrene of foot (HCC)   Gangrene of toe of left foot (HCC)   Controlled type 2 diabetes mellitus without complication, without long-term current use of insulin  (HCC)   Hypercalcemia   Constipation   Hypertension   Acute deep vein thrombosis (DVT) of left lower extremity (HCC)   Type 2 diabetes mellitus without complication, without long-term current use of insulin  (HCC)   Bacteremia   Nausea  #1 sepsis secondary to gas gangrene of the left great toe with osteomyelitis/Streptococcus bacteremia -Patient seen in consultation by podiatry and underwent surgical intervention on admission with partial first ray amputation of the left foot and second toe amputation at MPJ level of the left foot with application of dissolvable antibiotic beads of the left  foot. - Patient followed by podiatry underwent repeat irrigation and debridement with allograft application with wound VAC treatment on 01/29/2024. - Per podiatry patient high risk for need for revision including transmetatarsal amputation. - ID consulted and patient currently on linezolid  and Unasyn . - NWB per podiatry. - Blood cultures from 01/27/2024 with Streptococcus constellatus with repeat blood cultures from 01/29/2024 with no growth to date. - Wound cultures with Enterococcus and Streptococcus noted. - Patient with a leukocytosis which has started to trend down. - Fever curve has trended down. - 2D echo with a EF of 60 to 65%, NWMA, grade 1 diastolic dysfunction, no vegetations noted. -ID recommended TEE which was done this morning to 02/02/2024 with a EF of 60 to 65%, no LA/LAA thrombus or masses noted, no evidence of endocarditis.  - Was on continue Unasyn , linezolid . -ID changed antibiotics to Augmentin  to complete 6 weeks from negative blood culture with EOT 03/10/2024. -ID was following but has signed off as of 02/02/2024. -Podiatry with no further plans to go to the OR, recommending to transition to heel WB in postop shoe for short distance transfers and will need outpatient follow-up 2 weeks postdischarge. - Podiatry was following but have signed off as of 02/02/2024.  2.  Left lower extremity DVT -Patient started on Eliquis  for anticoagulation as no further surgical intervention planned at this time.  3.  Nausea/decreased appetite - Patient with some nausea, some abdominal discomfort, feels food is sitting in his abdomen and not moving further down.   - Patient with  history of poorly controlled diabetes mellitus, patient states symptoms at started prior to admission.   - Place on Reglan 5 mg IV every 6 hours x 3 doses.  - Gastric emptying study ordered however cannot be done until tomorrow 02/06/2024.  4.  Constipation - Patient placed on MiraLAX  twice daily, Senokot-S twice daily and  received sorbitol  x 2 doses as well as Dulcolax suppository with good results.   - Patient was having bowel movements however stated on 02/04/2024 that he has not had bowel movement over the past 24 hours and complaining of some mid abdominal discomfort and nausea.   - Patient placed on scheduled Reglan 5 mg every 6 hours x 3 doses with good results due to concerns for probable gastroparesis pending gastric emptying study. - Continue MiraLAX  daily, Senokot-S nightly, simethicone .   - Follow.  5.  Diabetes mellitus type 2 -Hemoglobin A1c 9.8. - CBG 257 this morning. - Patient noted to be on glipizide, Jardiance, semaglutide prior to admission. - Increase Semglee  to 30 units daily.   - Continue meal coverage NovoLog  to 8 units 3 times daily with meals.   - SSI.  - Diabetes coordinator following.  6.  Hypertension - BP stable.   - Continue lisinopril  and uptitrate as needed for better blood pressure control.   7.  Class I obesity -BMI of 32.84 kg/m. -Lifestyle modification - Outpatient follow-up with PCP.  8.  Hypercalcemia -Resolved.   DVT prophylaxis: Eliquis  Code Status: Full Family Communication: Updated patient.  Updated mother at bedside.  Disposition: SNF when bed available.  Status is: Inpatient Remains inpatient appropriate because: Severity of illness   Consultants:  Podiatry: Dr. Rosemarie Conquest 01/27/2024 ID: Dr. Zelda Hickman 01/29/2024 Wound care RN  Procedures: 2D echo 01/31/2024 CT left ankle and left foot 01/27/2024 Plain films of the left foot 01/27/2024, 01/29/2024 Lower extremity Dopplers 01/30/2024 Lower extremity arterial duplex 01/30/2024 Partial first ray amputation left foot/second toe amputation at MPJ level of the left foot/application of dissolvable antibiotic beads left foot per podiatry: Dr. Demetria Finch 01/27/2024 Repeat irrigation and debridement partial second metatarsal resection, left foot/dermal allograft application 5 x 4 cm left foot/application negative pressure  wound therapy 5/4 cm, left foot per podiatry: Dr. Rosemarie Conquest 01/29/2024 TEE 02/02/2024  Antimicrobials: Anti-infectives (From admission, onward)    Start     Dose/Rate Route Frequency Ordered Stop   02/02/24 2200  amoxicillin -clavulanate (AUGMENTIN ) 875-125 MG per tablet 1 tablet        1 tablet Oral Every 12 hours 02/02/24 1533 03/12/24 0959   01/30/24 1200  Ampicillin -Sulbactam (UNASYN ) 3 g in sodium chloride  0.9 % 100 mL IVPB  Status:  Discontinued        3 g 200 mL/hr over 30 Minutes Intravenous Every 6 hours 01/30/24 1103 02/02/24 1533   01/29/24 2200  linezolid  (ZYVOX ) tablet 600 mg  Status:  Discontinued        600 mg Oral Every 12 hours 01/29/24 1040 02/02/24 1530   01/29/24 1130  cefTRIAXone  (ROCEPHIN ) 2 g in sodium chloride  0.9 % 100 mL IVPB  Status:  Discontinued        2 g 200 mL/hr over 30 Minutes Intravenous Every 24 hours 01/29/24 1036 01/30/24 1103   01/28/24 1130  piperacillin -tazobactam (ZOSYN ) IVPB 3.375 g  Status:  Discontinued        3.375 g 12.5 mL/hr over 240 Minutes Intravenous Every 8 hours 01/28/24 1035 01/29/24 1036   01/28/24 1000  metroNIDAZOLE  (FLAGYL ) IVPB 500 mg  Status:  Discontinued  500 mg 100 mL/hr over 60 Minutes Intravenous Every 12 hours 01/28/24 0925 01/28/24 1035   01/27/24 2200  linezolid  (ZYVOX ) IVPB 600 mg  Status:  Discontinued        600 mg 300 mL/hr over 60 Minutes Intravenous Every 12 hours 01/27/24 1953 01/27/24 2007   01/27/24 1835  vancomycin  (VANCOCIN ) powder  Status:  Discontinued          As needed 01/27/24 1835 01/27/24 1843   01/27/24 1834  tobramycin  (NEBCIN ) injection  Status:  Discontinued          As needed 01/27/24 1835 01/27/24 1843   01/27/24 1515  linezolid  (ZYVOX ) IVPB 600 mg  Status:  Discontinued        600 mg 300 mL/hr over 60 Minutes Intravenous Every 12 hours 01/27/24 1502 01/29/24 1036   01/27/24 1515  piperacillin -tazobactam (ZOSYN ) IVPB 3.375 g        3.375 g 100 mL/hr over 30 Minutes Intravenous  Once  01/27/24 1505 01/27/24 1635   01/27/24 1430  vancomycin  (VANCOCIN ) IVPB 1000 mg/200 mL premix  Status:  Discontinued       Placed in "Followed by" Linked Group   1,000 mg 200 mL/hr over 60 Minutes Intravenous  Once 01/27/24 1419 01/27/24 1502   01/27/24 1430  vancomycin  (VANCOCIN ) IVPB 1000 mg/200 mL premix  Status:  Discontinued       Placed in "Followed by" Linked Group   1,000 mg 200 mL/hr over 60 Minutes Intravenous  Once 01/27/24 1419 01/27/24 1502   01/27/24 1415  Ampicillin -Sulbactam (UNASYN ) 3 g in sodium chloride  0.9 % 100 mL IVPB  Status:  Discontinued       Placed in "And" Linked Group   3 g 200 mL/hr over 30 Minutes Intravenous  Once 01/27/24 1412 01/27/24 1635   01/27/24 1415  vancomycin  (VANCOCIN ) IVPB 1000 mg/200 mL premix  Status:  Discontinued       Placed in "And" Linked Group   1,000 mg 200 mL/hr over 60 Minutes Intravenous  Once 01/27/24 1412 01/27/24 1419         Subjective: Patient lying in bed.  Stated had several small stools last night and this morning.  Improvement with nausea.  No emesis.  Denies any chest pain or shortness of breath.  Mother at bedside.  Objective: Vitals:   02/04/24 2114 02/04/24 2308 02/05/24 0421 02/05/24 0755  BP:  126/73 (!) 149/91 131/84  Pulse: (!) 102 100 96 93  Resp:  18 18 18   Temp:   98.6 F (37 C)   TempSrc:  Oral Oral   SpO2: 94% 96% 100% 99%  Weight:   101.7 kg   Height:        Intake/Output Summary (Last 24 hours) at 02/05/2024 0929 Last data filed at 02/04/2024 2038 Gross per 24 hour  Intake --  Output 0 ml  Net 0 ml   Filed Weights   01/31/24 0425 02/02/24 0532 02/05/24 0421  Weight: 104.2 kg 103.8 kg 101.7 kg    Examination:  General exam: NAD Respiratory system: Clear to auscultation bilaterally.  No wheezes, no crackles, no rhonchi.  Fair air movement.  Speaking in full sentences.  Cardiovascular system: Rate rhythm no murmurs rubs or gallops.  No JVD.  1+ left lower extremity edema.   Gastrointestinal system: Abdomen is soft, decreased mid abdominal discomfort, obese, positive bowel sounds.  No rebound.  No guarding.  Central nervous system: Alert and oriented. No focal neurological deficits. Extremities: 1+ left lower  extremity edema.  Left foot in postop bandage with wound VAC in place.  Skin: No rashes, lesions or ulcers Psychiatry: Judgement and insight appear normal. Mood & affect appropriate.     Data Reviewed: I have personally reviewed following labs and imaging studies  CBC: Recent Labs  Lab 02/01/24 0250 02/02/24 0244 02/03/24 0314 02/04/24 0243 02/05/24 0257  WBC 15.0* 12.0* 11.8* 12.7* 11.9*  NEUTROABS 11.7* 9.2*  --   --   --   HGB 12.0* 11.4* 11.7* 12.4* 12.3*  HCT 36.9* 35.3* 35.9* 38.3* 38.2*  MCV 89.3 88.9 89.1 89.5 90.1  PLT 443* 447* 448* 551* 538*    Basic Metabolic Panel: Recent Labs  Lab 01/30/24 0357 01/31/24 0241 02/01/24 0250 02/02/24 0244 02/03/24 0314 02/04/24 0243 02/05/24 0257  NA 134* 135 134* 134* 134* 132* 134*  K 4.1 4.1 4.1 3.8 4.0 4.0 3.8  CL 99 98 97* 97* 98 94* 93*  CO2 20* 23 24 26 27 27 29   GLUCOSE 204* 206* 229* 255* 296* 288* 255*  BUN 7 9 9 9 6  5* 8  CREATININE 0.89 0.84 0.76 0.70 0.74 0.68 0.87  CALCIUM 9.1 9.1 9.2 8.8* 8.9 9.3 9.4  MG  --  2.0 2.0 1.9 1.9  --   --   PHOS 3.3  --   --   --   --   --   --     GFR: Estimated Creatinine Clearance: 128.7 mL/min (by C-G formula based on SCr of 0.87 mg/dL).  Liver Function Tests: No results for input(s): "AST", "ALT", "ALKPHOS", "BILITOT", "PROT", "ALBUMIN" in the last 168 hours.   CBG: Recent Labs  Lab 02/04/24 0626 02/04/24 1149 02/04/24 1643 02/04/24 2105 02/05/24 0535  GLUCAP 284* 331* 261* 253* 257*     Recent Results (from the past 240 hours)  Blood Cultures x 2 sites     Status: Abnormal   Collection Time: 01/27/24  2:55 PM   Specimen: BLOOD LEFT FOREARM  Result Value Ref Range Status   Specimen Description BLOOD LEFT FOREARM   Final   Special Requests   Final    Blood Culture adequate volume BOTTLES DRAWN AEROBIC AND ANAEROBIC   Culture  Setup Time   Final    GRAM POSITIVE COCCI IN BOTH AEROBIC AND ANAEROBIC BOTTLES CRITICAL VALUE NOTED.  VALUE IS CONSISTENT WITH PREVIOUSLY REPORTED AND CALLED VALUE.    Culture (A)  Final    STREPTOCOCCUS CONSTELLATUS SUSCEPTIBILITIES PERFORMED ON PREVIOUS CULTURE WITHIN THE LAST 5 DAYS. Performed at [facility], [address], [place], [place] [address]    Report Status 01/30/2024 FINAL  Final  Blood Cultures x 2 sites     Status: Abnormal   Collection Time: 01/27/24  2:56 PM   Specimen: BLOOD  Result Value Ref Range Status   Specimen Description   Final    BLOOD RIGHT ANTECUBITAL Performed at [facility], [address], [place], [place] [address]    Special Requests   Final    Blood Culture adequate volume BOTTLES DRAWN AEROBIC AND ANAEROBIC   Culture  Setup Time   Final    GRAM POSITIVE COCCI IN CHAINS IN BOTH AEROBIC AND ANAEROBIC BOTTLES CRITICAL RESULT CALLED TO, READ BACK BY AND VERIFIED WITH: PHARMD Normie Becton [id number] AT 1958, ADC Performed at [facility], [address], [place], [place] [address]    Culture STREPTOCOCCUS CONSTELLATUS (A)  Final   Report Status 01/30/2024 FINAL  Final   Organism  ID, Bacteria STREPTOCOCCUS CONSTELLATUS  Final      Susceptibility   Streptococcus constellatus - MIC*    PENICILLIN <=0.06 SENSITIVE Sensitive     CEFTRIAXONE  0.25 SENSITIVE Sensitive     ERYTHROMYCIN <=0.12 SENSITIVE Sensitive     LEVOFLOXACIN 0.5 SENSITIVE Sensitive     VANCOMYCIN  0.5 SENSITIVE Sensitive     * STREPTOCOCCUS CONSTELLATUS  Blood Culture ID Panel (Reflexed)     Status: Abnormal   Collection Time: 01/27/24  2:56 PM  Result Value Ref Range Status   Enterococcus faecalis NOT DETECTED NOT DETECTED Final   Enterococcus Faecium NOT DETECTED NOT DETECTED Final   Listeria monocytogenes NOT DETECTED NOT DETECTED Final    Staphylococcus species NOT DETECTED NOT DETECTED Final   Staphylococcus aureus (BCID) NOT DETECTED NOT DETECTED Final   Staphylococcus epidermidis NOT DETECTED NOT DETECTED Final   Staphylococcus lugdunensis NOT DETECTED NOT DETECTED Final   Streptococcus species DETECTED (A) NOT DETECTED Final    Comment: Not Enterococcus species, Streptococcus agalactiae, Streptococcus pyogenes, or Streptococcus pneumoniae. CRITICAL RESULT CALLED TO, READ BACK BY AND VERIFIED WITH: PHARMD TONY R. [id number] AT 1956, ADC    Streptococcus agalactiae NOT DETECTED NOT DETECTED Final   Streptococcus pneumoniae NOT DETECTED NOT DETECTED Final   Streptococcus pyogenes NOT DETECTED NOT DETECTED Final   A.calcoaceticus-baumannii NOT DETECTED NOT DETECTED Final   Bacteroides fragilis NOT DETECTED NOT DETECTED Final   Enterobacterales NOT DETECTED NOT DETECTED Final   Enterobacter cloacae complex NOT DETECTED NOT DETECTED Final   Escherichia coli NOT DETECTED NOT DETECTED Final   Klebsiella aerogenes NOT DETECTED NOT DETECTED Final   Klebsiella oxytoca NOT DETECTED NOT DETECTED Final   Klebsiella pneumoniae NOT DETECTED NOT DETECTED Final   Proteus species NOT DETECTED NOT DETECTED Final   Salmonella species NOT DETECTED NOT DETECTED Final   Serratia marcescens NOT DETECTED NOT DETECTED Final   Haemophilus influenzae NOT DETECTED NOT DETECTED Final   Neisseria meningitidis NOT DETECTED NOT DETECTED Final   Pseudomonas aeruginosa NOT DETECTED NOT DETECTED Final   Stenotrophomonas maltophilia NOT DETECTED NOT DETECTED Final   Candida albicans NOT DETECTED NOT DETECTED Final   Candida auris NOT DETECTED NOT DETECTED Final   Candida glabrata NOT DETECTED NOT DETECTED Final   Candida krusei NOT DETECTED NOT DETECTED Final   Candida parapsilosis NOT DETECTED NOT DETECTED Final   Candida tropicalis NOT DETECTED NOT DETECTED Final   Cryptococcus neoformans/gattii NOT DETECTED NOT DETECTED Final    Comment: Performed  at [facility], [address] [place], [place] [address]  Aerobic/Anaerobic Culture w Gram Stain (surgical/deep wound)     Status: None   Collection Time: 01/27/24  6:14 PM   Specimen: PATH Soft tissue resection  Result Value Ref Range Status   Specimen Description TISSUE  Final   Special Requests LEFT, 1ST RAY AMP  Final   Gram Stain   Final    NO WBC SEEN ABUNDANT GRAM POSITIVE COCCI MODERATE GRAM NEGATIVE RODS RARE GRAM POSITIVE RODS    Culture   Final    MODERATE ENTEROCOCCUS FAECALIS MODERATE STREPTOCOCCUS GROUP F Beta hemolytic streptococci are predictably susceptible to penicillin and other beta lactams. Susceptibility testing not routinely performed. FEW PREVOTELLA SPECIES BETA LACTAMASE POSITIVE Performed at [facility], [address] [place], [place] [address]    Report Status 01/30/2024 FINAL  Final   Organism ID, Bacteria ENTEROCOCCUS FAECALIS  Final      Susceptibility   Enterococcus faecalis - MIC*  AMPICILLIN  <=2 SENSITIVE Sensitive     VANCOMYCIN  1 SENSITIVE Sensitive     GENTAMICIN  SYNERGY SENSITIVE Sensitive     * MODERATE ENTEROCOCCUS FAECALIS  Aerobic/Anaerobic Culture w Gram Stain (surgical/deep wound)     Status: None   Collection Time: 01/27/24  6:15 PM   Specimen: PATH Bone resection; Tissue  Result Value Ref Range Status   Specimen Description BONE  Final   Special Requests LEFT, 1ST RAY BONE  Final   Gram Stain   Final    NO WBC SEEN MODERATE GRAM POSITIVE COCCI MODERATE GRAM NEGATIVE RODS FEW GRAM POSITIVE RODS    Culture   Final    MODERATE ENTEROCOCCUS FAECALIS SUSCEPTIBILITIES PERFORMED ON PREVIOUS CULTURE WITHIN THE LAST 5 DAYS. MODERATE STREPTOCOCCUS GROUP F Beta hemolytic streptococci are predictably susceptible to penicillin and other beta lactams. Susceptibility testing not routinely performed. FEW PREVOTELLA SPECIES BETA LACTAMASE POSITIVE Performed at [facility], [address] [place], [place]  [address]    Report Status 01/30/2024 FINAL  Final  Culture, blood (Routine X 2) w Reflex to ID Panel     Status: None   Collection Time: 01/29/24 10:55 AM   Specimen: BLOOD LEFT ARM  Result Value Ref Range Status   Specimen Description BLOOD LEFT ARM  Final   Special Requests   Final    BOTTLES DRAWN AEROBIC AND ANAEROBIC Blood Culture results may not be optimal due to an inadequate volume of blood received in culture bottles   Culture   Final    NO GROWTH 5 DAYS Performed at [facility], [address], [place], [place] [address]    Report Status 02/03/2024 FINAL  Final  Culture, blood (Routine X 2) w Reflex to ID Panel     Status: None   Collection Time: 01/29/24 10:55 AM   Specimen: BLOOD LEFT ARM  Result Value Ref Range Status   Specimen Description BLOOD LEFT ARM  Final   Special Requests   Final    BOTTLES DRAWN AEROBIC AND ANAEROBIC Blood Culture results may not be optimal due to an inadequate volume of blood received in culture bottles   Culture   Final    NO GROWTH 5 DAYS Performed at [facility], [address] [place], [place] [address]    Report Status 02/03/2024 FINAL  Final         Radiology Studies: No results found.       Scheduled Meds:  acetaminophen   650 mg Rectal Once   amoxicillin -clavulanate  1 tablet Oral Q12H   apixaban   10 mg Oral BID   Followed by   Cecily Cohen ON 02/06/2024] apixaban   5 mg Oral BID   gabapentin   300 mg Oral TID   insulin  aspart  0-15 Units Subcutaneous TID WC   insulin  aspart  0-5 Units Subcutaneous QHS   insulin  aspart  8 Units Subcutaneous TID WC   insulin  glargine-yfgn  25 Units Subcutaneous Q24H   lisinopril   20 mg Oral Daily   metoCLOPramide (REGLAN) injection  5 mg Intravenous Q6H   mupirocin  ointment   Topical Daily   pneumococcal 20-valent conjugate vaccine  0.5 mL Intramuscular Tomorrow-1000   polyethylene glycol  17 g Oral Daily   senna-docusate  1 tablet Oral QHS   Continuous Infusions:      LOS: 9 days    Time spent: 35 minutes    Hilda Lovings, MD [facility]   To contact the attending provider between 7A-7P or  the covering provider during after hours 7P-7A, please log into the web site [email or web address] and access using universal [place] password for that web site. If you do not have the password, please call the hospital operator.  02/05/2024, 9:29 AM

## 2024-02-05 NOTE — Progress Notes (Signed)
 Mobility Specialist: Progress Note   02/05/24 1605  Mobility  Activity Ambulated with assistance in hallway  Level of Assistance Contact guard assist, steadying assist  Assistive Device Front wheel walker  Distance Ambulated (ft) 200 ft  LLE Weight Bearing Per Provider Order PWB (through heel)  Activity Response Tolerated well  Mobility Referral Yes  Mobility visit 1 Mobility  Mobility Specialist Start Time (ACUTE ONLY) 1414  Mobility Specialist Stop Time (ACUTE ONLY) 1438  Mobility Specialist Time Calculation (min) (ACUTE ONLY) 24 min    Pt was agreeable to mobility session - received in bed. SV for bed mobility and STS. CG for ambulation with chair follow. Took 3x seated breaks d/t fatigue. Asymptomatic but HR peaked to 157 BPM. Adhering to WB precautions well. Returned to room without fault. Left in chair with all needs met, call bell in reach. Mother in room.   Deloria Fetch Mobility Specialist Please contact via SecureChat or Rehab office at [phone]

## 2024-02-05 NOTE — Plan of Care (Signed)
  Problem: Health Behavior/Discharge Planning: Goal: Ability to manage health-related needs will improve Outcome: Progressing   Problem: Clinical Measurements: Goal: Ability to maintain clinical measurements within normal limits will improve Outcome: Progressing   Problem: Nutrition: Goal: Adequate nutrition will be maintained Outcome: Progressing   Problem: Metabolic: Goal: Ability to maintain appropriate glucose levels will improve Outcome: Progressing

## 2024-02-05 NOTE — Plan of Care (Signed)

## 2024-02-05 NOTE — Inpatient Diabetes Management (Signed)
 Inpatient Diabetes Program Recommendations  AACE/ADA: New Consensus Statement on Inpatient Glycemic Control (2015)  Target Ranges:  Prepandial:   less than 140 mg/dL      Peak postprandial:   less than 180 mg/dL (1-2 hours)      Critically ill patients:  140 - 180 mg/dL   Lab Results  Component Value Date   GLUCAP 257 (H) 02/05/2024   HGBA1C 9.8 (H) 01/27/2024    Latest Reference Range & Units 02/04/24 06:26 02/04/24 11:49 02/04/24 16:43 02/04/24 21:05 02/05/24 05:35  Glucose-Capillary 70 - 99 mg/dL 161 (H) 096 (H) 045 (H) 253 (H) 257 (H)    Diabetes history: DM 2 Outpatient Diabetes Medications: Glipizide 10 mg bid, Jardiance 10 mg Daily, Rybelsus 3 mg Daily Current orders for Inpatient glycemic control:  Semglee  25 units Daily Novolog  0-15 units tid + hs Novolog  8 units tid meal coverage  Inpatient Diabetes Program Recommendations:    -   Increase Semglee  to 30 units  Pt has been taught insulin  and given sample of freestyle Libre CGM. Will follow up as needed.  Thanks, Eloise Hake RN, MSN, BC-ADM Inpatient Diabetes Coordinator Team Pager [phone] (8a-5p)

## 2024-02-05 NOTE — TOC Transition Note (Deleted)
 Transition of Care [facility]) - Discharge Note   Patient Details  Name: Dakota Becker MRN: [id number] Date of Birth: 1975/11/28  [facility]) CM/SW Contact:  Arron Big, LCSWA Phone Number: 02/05/2024, 11:20 AM   Clinical Narrative:   CSW met with patient and family at bedside to discuss choice of SNF. CSW informed patient that [facility] has declined at this time due to patients age. Patient stated he would like to go to [facility] for SNF. CSW reached out to [place] w/ [facility] to confirm bed availability. If [facility] can accept, facility will need to complete insurance auth and order wound vac for patient.   TOC will continue to follow.      Barriers to Discharge: Continued Medical Work up, English as a second language teacher, Other (must enter comment) (Facility will do auth and order wound vac)   Patient Goals and CMS Choice Patient states their goals for this hospitalization and ongoing recovery are:: To get better          Discharge Placement                       Discharge Plan and Services Additional resources added to the After Visit Summary for   In-house Referral: Clinical Social Work                                   Social Drivers of Health (SDOH) Interventions SDOH Screenings   Food Insecurity: No Food Insecurity (01/27/2024)  Housing: Low Risk  (01/27/2024)  Transportation Needs: No Transportation Needs (01/27/2024)  Utilities: Not At Risk (01/27/2024)  Social Connections: Socially Integrated (01/27/2024)  Tobacco Use: Low Risk  (02/02/2024)     Readmission Risk Interventions     No data to display

## 2024-02-06 ENCOUNTER — Inpatient Hospital Stay (HOSPITAL_COMMUNITY)

## 2024-02-06 DIAGNOSIS — Z0389 Encounter for observation for other suspected diseases and conditions ruled out: Secondary | ICD-10-CM | POA: Diagnosis not present

## 2024-02-06 DIAGNOSIS — A480 Gas gangrene: Secondary | ICD-10-CM | POA: Diagnosis not present

## 2024-02-06 DIAGNOSIS — S91302A Unspecified open wound, left foot, initial encounter: Secondary | ICD-10-CM | POA: Diagnosis not present

## 2024-02-06 DIAGNOSIS — T8140XA Infection following a procedure, unspecified, initial encounter: Secondary | ICD-10-CM | POA: Diagnosis not present

## 2024-02-06 DIAGNOSIS — E119 Type 2 diabetes mellitus without complications: Secondary | ICD-10-CM | POA: Diagnosis not present

## 2024-02-06 DIAGNOSIS — M869 Osteomyelitis, unspecified: Secondary | ICD-10-CM | POA: Diagnosis not present

## 2024-02-06 LAB — BASIC METABOLIC PANEL WITH GFR
Anion gap: 10 (ref 5–15)
BUN: 9 mg/dL (ref 6–20)
CO2: 27 mmol/L (ref 22–32)
Calcium: 9.1 mg/dL (ref 8.9–10.3)
Chloride: 98 mmol/L (ref 98–111)
Creatinine, Ser: 0.77 mg/dL (ref 0.61–1.24)
GFR, Estimated: >60 mL/min (ref >60)
Glucose, Bld: 229 mg/dL — ABNORMAL HIGH (ref 70–99)
Potassium: 4 mmol/L (ref 3.5–5.1)
Sodium: 135 mmol/L (ref 135–145)

## 2024-02-06 LAB — CBC
HCT: 36.1 % — ABNORMAL LOW (ref 39.0–52.0)
Hemoglobin: 11.7 g/dL — ABNORMAL LOW (ref 13.0–17.0)
MCH: 29 pg (ref 26.0–34.0)
MCHC: 32.4 g/dL (ref 30.0–36.0)
MCV: 89.6 fL (ref 80.0–100.0)
Platelets: 519 10*3/uL — ABNORMAL HIGH (ref 150–400)
RBC: 4.03 MIL/uL — ABNORMAL LOW (ref 4.22–5.81)
RDW: 13.1 % (ref 11.5–15.5)
WBC: 10.6 10*3/uL — ABNORMAL HIGH (ref 4.0–10.5)
nRBC: 0 % (ref 0.0–0.2)

## 2024-02-06 LAB — GLUCOSE, CAPILLARY
Glucose-Capillary: 263 mg/dL — ABNORMAL HIGH (ref 70–99)
Glucose-Capillary: 337 mg/dL — ABNORMAL HIGH (ref 70–99)
Glucose-Capillary: 188 mg/dL — ABNORMAL HIGH (ref 70–99)
Glucose-Capillary: 237 mg/dL — ABNORMAL HIGH (ref 70–99)

## 2024-02-06 MED ORDER — TECHNETIUM TC 99M SULFUR COLLOID: 2.2000 | Freq: Once | INTRAVENOUS | Status: DC | PRN

## 2024-02-06 MED ORDER — TECHNETIUM TC 99M SULFUR COLLOID
2.2000 | Freq: Once | INTRAVENOUS | Status: AC | PRN
Start: 2024-02-06 — End: 2024-02-06
  Administered 2024-02-06: 2.2 via INTRAVENOUS

## 2024-02-06 MED ORDER — METOCLOPRAMIDE HCL 5 MG PO TABS
5.0000 mg | ORAL_TABLET | Freq: Three times a day (TID) | ORAL | Status: DC
  Administered 2024-02-06 – 2024-02-08 (×6): 5 mg via ORAL
  Filled 2024-02-06 (×7): qty 1

## 2024-02-06 MED ORDER — APIXABAN 5 MG PO TABS
5.0000 mg | ORAL_TABLET | Freq: Two times a day (BID) | ORAL | Status: DC
  Administered 2024-02-06 – 2024-02-08 (×5): 5 mg via ORAL
  Filled 2024-02-06 (×4): qty 1

## 2024-02-06 MED ORDER — APIXABAN 5 MG PO TABS: 5.0000 mg | ORAL_TABLET | Freq: Two times a day (BID) | ORAL | Status: DC

## 2024-02-06 MED ORDER — INSULIN ASPART 100 UNIT/ML IJ SOLN
10.0000 [IU] | Freq: Three times a day (TID) | INTRAMUSCULAR | Status: DC
  Administered 2024-02-06 – 2024-02-08 (×6): 10 [IU] via SUBCUTANEOUS

## 2024-02-06 MED ORDER — METOCLOPRAMIDE HCL 5 MG/ML IJ SOLN
5.0000 mg | Freq: Four times a day (QID) | INTRAMUSCULAR | Status: DC
  Administered 2024-02-06: 5 mg via INTRAVENOUS
  Filled 2024-02-06 (×2): qty 2

## 2024-02-06 NOTE — Telephone Encounter (Signed)
 Dakota Becker

## 2024-02-06 NOTE — TOC Progression Note (Signed)
 Transition of Care [facility]) - Progression Note    Patient Details  Name: Dakota Becker MRN: [id number] Date of Birth: 1976/01/13  Transition of Care [facility]) CM/SW Contact  Arron Big, [place] Phone Number: 02/06/2024, 11:17 AM  Clinical Narrative:   CSW spoke with Voncille [place], pts wife, about plans for discharge. Tameka stated she would like her husband to discharge to SNF a this time. She spoke with Janeann Mean w/ [facility] Admissions about doing a privately paying and is waiting to hear back regarding if upfront costs. Tameka stated she would feel more comfortable with her husband being at a SNF for at least 7-10 days before discharging home. CSW reached out to [place] about Tameka wanting to private pay - per Janeann Mean, she is going to call Tameka to discuss that she has to pay 30 days upfront and then see if she wants to proceed with SNF placement. CSW updated treatment team.   TOC will continue to follow.    Expected Discharge Plan: Skilled Nursing Facility Barriers to Discharge: Continued Medical Work up, English as a second language teacher, Other (must enter comment) (Facility will do auth and order wound vac)  Expected Discharge Plan and Services In-house Referral: Clinical Social Work     Living arrangements for the past 2 months: Single Family Home                                       Social Determinants of Health (SDOH) Interventions SDOH Screenings   Food Insecurity: No Food Insecurity (01/27/2024)  Housing: Low Risk  (01/27/2024)  Transportation Needs: No Transportation Needs (01/27/2024)  Utilities: Not At Risk (01/27/2024)  Social Connections: Socially Integrated (01/27/2024)  Tobacco Use: Low Risk  (02/02/2024)    Readmission Risk Interventions     No data to display

## 2024-02-06 NOTE — TOC Progression Note (Signed)
 Transition of Care [facility]) - Progression Note    Patient Details  Name: Dakota Becker MRN: [id number] Date of Birth: 1976-03-20  [facility]) CM/SW Contact  Jennett Model, RN Phone Number: 02/06/2024, 12:55 PM  Clinical Narrative:    Per CSW wife feels more comfortable with patient going to SNF at this time and will let to pay privately.  NCM contacted Sherrlyn Dolores with [facility] and she states to turn the wound vac back in , this NCM returned the wound vac to [place].  NCM informed Loetta Ringer with [facility] that patient will be going to SNF.   Expected Discharge Plan: Skilled Nursing Facility Barriers to Discharge: Continued Medical Work up, English as a second language teacher, Other (must enter comment) (Facility will do auth and order wound vac)  Expected Discharge Plan and Services In-house Referral: Clinical Social Work     Living arrangements for the past 2 months: Single Family Home                                       Social Determinants of Health (SDOH) Interventions SDOH Screenings   Food Insecurity: No Food Insecurity (01/27/2024)  Housing: Low Risk  (01/27/2024)  Transportation Needs: No Transportation Needs (01/27/2024)  Utilities: Not At Risk (01/27/2024)  Social Connections: Socially Integrated (01/27/2024)  Tobacco Use: Low Risk  (02/02/2024)    Readmission Risk Interventions     No data to display

## 2024-02-06 NOTE — Progress Notes (Signed)
 PT Cancellation Note  Patient Details Name: Dakota Becker MRN: [id number] DOB: December 17, 1975   Cancelled Treatment:    Reason Eval/Treat Not Completed: Patient at procedure or test/unavailable. Pt off the unit for gastric emptying study. RN reports procedure to take 2-4 hours. PT to return as able to progress mobility.  Jeyden Coffelt, PT, DPT Acute Rehabilitation Services Secure chat preferred Office #: [phone]    Jenna Moan 02/06/2024, 9:11 AM

## 2024-02-06 NOTE — Plan of Care (Signed)

## 2024-02-06 NOTE — Progress Notes (Signed)
 PROGRESS NOTE    Dakota Becker  ZOX:[id number] DOB: [birth year] DOA: 01/27/2024 PCP: Alejandro Hurt, FNP    Chief Complaint  Patient presents with   Foot Pain   Wound Infection   Code Sepsis    Brief Narrative:   Dakota Becker is a 48 y.o. male with medical history significant of diabetes, essential hypertension, hyperlipidemia, with great toe ulcer developed some blisters.  Not taking his diabetic medication.  He went to PCP and was put on oral antibiotics and referred to podiatry.  Recommended wound management but he never got contacted so started having increasing swelling in the left toe with pain for 5 days or so with some chills and nausea ,vomiting and foul odor.  Patient then presented to [facility].  Upon arrival to the ED podiatry was consulted.  Patient  met sepsis criteria secondary to osteomyelitis of the left great toe.  Patient was then taken to the OR and underwent left hallux amputation.  Patient underwent repeat debridement on 01/29/2024 by podiatry and ID  has been consulted for antibiotic management..    Assessment & Plan:   Principal Problem:   Toe osteomyelitis, left (HCC) Active Problems:   Gas gangrene of foot (HCC)   Gangrene of toe of left foot (HCC)   Controlled type 2 diabetes mellitus without complication, without long-term current use of insulin  (HCC)   Hypercalcemia   Constipation   Hypertension   Acute deep vein thrombosis (DVT) of left lower extremity (HCC)   Type 2 diabetes mellitus without complication, without long-term current use of insulin  (HCC)   Bacteremia   Nausea  #1 sepsis secondary to gas gangrene of the left great toe with osteomyelitis/Streptococcus bacteremia -Patient seen in consultation by podiatry and underwent surgical intervention on admission with partial first ray amputation of the left foot and second toe amputation at MPJ level of the left foot with application of dissolvable antibiotic beads of the left  foot. - Patient followed by podiatry underwent repeat irrigation and debridement with allograft application with wound VAC treatment on 01/29/2024. - Per podiatry patient high risk for need for revision including transmetatarsal amputation. - ID consulted and patient currently on linezolid  and Unasyn . - NWB per podiatry. - Blood cultures from 01/27/2024 with Streptococcus constellatus with repeat blood cultures from 01/29/2024 with no growth to date. - Wound cultures with Enterococcus and Streptococcus noted. - Patient with a leukocytosis which has started to trend down. - Fever curve has trended down. - 2D echo with a EF of 60 to 65%, NWMA, grade 1 diastolic dysfunction, no vegetations noted. -ID recommended TEE which was done this morning to 02/02/2024 with a EF of 60 to 65%, no LA/LAA thrombus or masses noted, no evidence of endocarditis.  - Was on continue Unasyn , linezolid . -ID changed antibiotics to Augmentin  to complete 6 weeks from negative blood culture with EOT 03/10/2024. -ID was following but has signed off as of 02/02/2024. -Podiatry with no further plans to go to the OR, recommending to transition to heel WB in postop shoe for short distance transfers and will need outpatient follow-up 2 weeks postdischarge. - Podiatry was following but have signed off as of 02/02/2024.  2.  Left lower extremity DVT -Patient started on Eliquis  for anticoagulation as no further surgical intervention planned at this time.  3.  Nausea/decreased appetite - Patient with some nausea, some abdominal discomfort, feels food is sitting in his abdomen and not moving further down.   - Patient with  history of poorly controlled diabetes mellitus, patient states symptoms at started prior to admission.   - Placed on Reglan 5 mg IV every 6 hours x 3 doses.  - Patient underwent gastric emptying study this morning which was normal.   - Change IV Reglan to oral Reglan 5 mg AC and at bedtime while in-house and on discharge  could probably place as needed.  4.  Constipation - Patient placed on MiraLAX  twice daily, Senokot-S twice daily and received sorbitol  x 2 doses as well as Dulcolax suppository with good results.   - Patient was having bowel movements however stated on 02/04/2024 that he has not had bowel movement over the past 24 hours and complaining of some mid abdominal discomfort and nausea.   - Patient placed on scheduled Reglan 5 mg every 6 hours x 3 doses with good results due to concerns for probable gastroparesis pending gastric emptying study. - Continue MiraLAX  daily, Senokot-S nightly, simethicone .   - Follow.  5.  Diabetes mellitus type 2 -Hemoglobin A1c 9.8. - CBG 263 this morning. - Patient noted to be on glipizide, Jardiance, semaglutide prior to admission. - Continue Semglee  to 30 units daily.   - Increase meal coverage NovoLog  to 10 units 3 times daily with meals.   - SSI. - Diabetes coordinator following.  6.  Hypertension - BP stable.   - Continue lisinopril .   7.  Class I obesity -BMI of 32.84 kg/m. -Lifestyle modification - Outpatient follow-up with PCP.  8.  Hypercalcemia -Resolved.   DVT prophylaxis: Eliquis  Code Status: Full Family Communication: Updated patient.  Updated mother and father at bedside.  Disposition: SNF when bed available hopefully in the next 24 hours..  Status is: Inpatient Remains inpatient appropriate because: Severity of illness   Consultants:  Podiatry: Dr. Rosemarie Conquest 01/27/2024 ID: Dr. Zelda Hickman 01/29/2024 Wound care RN  Procedures: 2D echo 01/31/2024 CT left ankle and left foot 01/27/2024 Plain films of the left foot 01/27/2024, 01/29/2024 Lower extremity Dopplers 01/30/2024 Lower extremity arterial duplex 01/30/2024 Partial first ray amputation left foot/second toe amputation at MPJ level of the left foot/application of dissolvable antibiotic beads left foot per podiatry: Dr. Demetria Finch 01/27/2024 Repeat irrigation and debridement partial second  metatarsal resection, left foot/dermal allograft application 5 x 4 cm left foot/application negative pressure wound therapy 5/4 cm, left foot per podiatry: Dr. Rosemarie Conquest 01/29/2024 TEE 02/02/2024 Gastric emptying study 02/06/2024  Antimicrobials: Anti-infectives (From admission, onward)    Start     Dose/Rate Route Frequency Ordered Stop   02/02/24 2200  amoxicillin -clavulanate (AUGMENTIN ) 875-125 MG per tablet 1 tablet        1 tablet Oral Every 12 hours 02/02/24 1533 03/12/24 0959   01/30/24 1200  Ampicillin -Sulbactam (UNASYN ) 3 g in sodium chloride  0.9 % 100 mL IVPB  Status:  Discontinued        3 g 200 mL/hr over 30 Minutes Intravenous Every 6 hours 01/30/24 1103 02/02/24 1533   01/29/24 2200  linezolid  (ZYVOX ) tablet 600 mg  Status:  Discontinued        600 mg Oral Every 12 hours 01/29/24 1040 02/02/24 1530   01/29/24 1130  cefTRIAXone  (ROCEPHIN ) 2 g in sodium chloride  0.9 % 100 mL IVPB  Status:  Discontinued        2 g 200 mL/hr over 30 Minutes Intravenous Every 24 hours 01/29/24 1036 01/30/24 1103   01/28/24 1130  piperacillin -tazobactam (ZOSYN ) IVPB 3.375 g  Status:  Discontinued        3.375 g 12.5  mL/hr over 240 Minutes Intravenous Every 8 hours 01/28/24 1035 01/29/24 1036   01/28/24 1000  metroNIDAZOLE  (FLAGYL ) IVPB 500 mg  Status:  Discontinued        500 mg 100 mL/hr over 60 Minutes Intravenous Every 12 hours 01/28/24 0925 01/28/24 1035   01/27/24 2200  linezolid  (ZYVOX ) IVPB 600 mg  Status:  Discontinued        600 mg 300 mL/hr over 60 Minutes Intravenous Every 12 hours 01/27/24 1953 01/27/24 2007   01/27/24 1835  vancomycin  (VANCOCIN ) powder  Status:  Discontinued          As needed 01/27/24 1835 01/27/24 1843   01/27/24 1834  tobramycin  (NEBCIN ) injection  Status:  Discontinued          As needed 01/27/24 1835 01/27/24 1843   01/27/24 1515  linezolid  (ZYVOX ) IVPB 600 mg  Status:  Discontinued        600 mg 300 mL/hr over 60 Minutes Intravenous Every 12 hours 01/27/24 1502  01/29/24 1036   01/27/24 1515  piperacillin -tazobactam (ZOSYN ) IVPB 3.375 g        3.375 g 100 mL/hr over 30 Minutes Intravenous  Once 01/27/24 1505 01/27/24 1635   01/27/24 1430  vancomycin  (VANCOCIN ) IVPB 1000 mg/200 mL premix  Status:  Discontinued       Placed in "Followed by" Linked Group   1,000 mg 200 mL/hr over 60 Minutes Intravenous  Once 01/27/24 1419 01/27/24 1502   01/27/24 1430  vancomycin  (VANCOCIN ) IVPB 1000 mg/200 mL premix  Status:  Discontinued       Placed in "Followed by" Linked Group   1,000 mg 200 mL/hr over 60 Minutes Intravenous  Once 01/27/24 1419 01/27/24 1502   01/27/24 1415  Ampicillin -Sulbactam (UNASYN ) 3 g in sodium chloride  0.9 % 100 mL IVPB  Status:  Discontinued       Placed in "And" Linked Group   3 g 200 mL/hr over 30 Minutes Intravenous  Once 01/27/24 1412 01/27/24 1635   01/27/24 1415  vancomycin  (VANCOCIN ) IVPB 1000 mg/200 mL premix  Status:  Discontinued       Placed in "And" Linked Group   1,000 mg 200 mL/hr over 60 Minutes Intravenous  Once 01/27/24 1412 01/27/24 1419         Subjective: Patient laying in bed, just returned from gastric emptying study.  Denies any chest pain or shortness of breath.  No nausea or vomiting.  No abdominal pain.  Some complaints of left foot pain.  Mother and father at bedside.    Objective: Vitals:   02/06/24 0503 02/06/24 0748 02/06/24 1140 02/06/24 1236  BP: 138/73 128/76 117/85 117/85  Pulse: 92 100    Resp: 18 20 18    Temp: 98.5 F (36.9 C) 98.6 F (37 C)    TempSrc: Oral Oral    SpO2: 94% 92% 100%   Weight: 99.8 kg     Height:        Intake/Output Summary (Last 24 hours) at 02/06/2024 1325 Last data filed at 02/06/2024 0838 Gross per 24 hour  Intake --  Output 300 ml  Net -300 ml   Filed Weights   02/02/24 0532 02/05/24 0421 02/06/24 0503  Weight: 103.8 kg 101.7 kg 99.8 kg    Examination:  General exam: NAD Respiratory system: CTAB.  No wheezes, no crackles, no rhonchi.  Fair air  movement.  Speaking in full sentences.  Cardiovascular system: RRR no murmurs rubs or gallops.  No JVD.  1+ left  lower extremity edema.  Gastrointestinal system: Abdomen is soft, nontender, nondistended, positive bowel sounds.  No rebound.  No guarding.  Central nervous system: Alert and oriented. No focal neurological deficits. Extremities: 1+ left lower extremity edema.  Left foot in postop bandage with wound VAC in place.  Skin: No rashes, lesions or ulcers Psychiatry: Judgement and insight appear normal. Mood & affect appropriate.     Data Reviewed: I have personally reviewed following labs and imaging studies  CBC: Recent Labs  Lab 02/01/24 0250 02/02/24 0244 02/03/24 0314 02/04/24 0243 02/05/24 0257 02/06/24 0319  WBC 15.0* 12.0* 11.8* 12.7* 11.9* 10.6*  NEUTROABS 11.7* 9.2*  --   --   --   --   HGB 12.0* 11.4* 11.7* 12.4* 12.3* 11.7*  HCT 36.9* 35.3* 35.9* 38.3* 38.2* 36.1*  MCV 89.3 88.9 89.1 89.5 90.1 89.6  PLT 443* 447* 448* 551* 538* 519*    Basic Metabolic Panel: Recent Labs  Lab 01/31/24 0241 02/01/24 0250 02/02/24 0244 02/03/24 0314 02/04/24 0243 02/05/24 0257 02/06/24 0319  NA 135 134* 134* 134* 132* 134* 135  K 4.1 4.1 3.8 4.0 4.0 3.8 4.0  CL 98 97* 97* 98 94* 93* 98  CO2 23 24 26 27 27 29 27   GLUCOSE 206* 229* 255* 296* 288* 255* 229*  BUN 9 9 9 6  5* 8 9  CREATININE 0.84 0.76 0.70 0.74 0.68 0.87 0.77  CALCIUM 9.1 9.2 8.8* 8.9 9.3 9.4 9.1  MG 2.0 2.0 1.9 1.9  --   --   --     GFR: Estimated Creatinine Clearance: 138.9 mL/min (by C-G formula based on SCr of 0.77 mg/dL).  Liver Function Tests: No results for input(s): "AST", "ALT", "ALKPHOS", "BILITOT", "PROT", "ALBUMIN" in the last 168 hours.   CBG: Recent Labs  Lab 02/05/24 1124 02/05/24 1538 02/05/24 2117 02/06/24 0617 02/06/24 1210  GLUCAP 208* 213* 270* 263* 337*     Recent Results (from the past 240 hours)  Blood Cultures x 2 sites     Status: Abnormal   Collection Time:  01/27/24  2:55 PM   Specimen: BLOOD LEFT FOREARM  Result Value Ref Range Status   Specimen Description BLOOD LEFT FOREARM  Final   Special Requests   Final    Blood Culture adequate volume BOTTLES DRAWN AEROBIC AND ANAEROBIC   Culture  Setup Time   Final    GRAM POSITIVE COCCI IN BOTH AEROBIC AND ANAEROBIC BOTTLES CRITICAL VALUE NOTED.  VALUE IS CONSISTENT WITH PREVIOUSLY REPORTED AND CALLED VALUE.    Culture (A)  Final    STREPTOCOCCUS CONSTELLATUS SUSCEPTIBILITIES PERFORMED ON PREVIOUS CULTURE WITHIN THE LAST 5 DAYS. Performed at [facility], [address] [place], [place] [address]    Report Status 01/30/2024 FINAL  Final  Blood Cultures x 2 sites     Status: Abnormal   Collection Time: 01/27/24  2:56 PM   Specimen: BLOOD  Result Value Ref Range Status   Specimen Description   Final    BLOOD RIGHT ANTECUBITAL Performed at [facility], [address] [place], [place] [address]    Special Requests   Final    Blood Culture adequate volume BOTTLES DRAWN AEROBIC AND ANAEROBIC   Culture  Setup Time   Final    GRAM POSITIVE COCCI IN CHAINS IN BOTH AEROBIC AND ANAEROBIC BOTTLES CRITICAL RESULT CALLED TO, READ BACK BY AND VERIFIED WITH: PHARMD Normie Becton [id number] AT 1958, ADC Performed at [facility], [address], [place], [place] [address]    Culture STREPTOCOCCUS CONSTELLATUS (A)  Final   Report Status 01/30/2024 FINAL  Final   Organism ID, Bacteria STREPTOCOCCUS CONSTELLATUS  Final      Susceptibility   Streptococcus constellatus - MIC*    PENICILLIN <=0.06 SENSITIVE Sensitive     CEFTRIAXONE  0.25 SENSITIVE Sensitive     ERYTHROMYCIN <=0.12 SENSITIVE Sensitive     LEVOFLOXACIN 0.5 SENSITIVE Sensitive     VANCOMYCIN  0.5 SENSITIVE Sensitive     * STREPTOCOCCUS CONSTELLATUS  Blood Culture ID Panel (Reflexed)     Status: Abnormal   Collection Time: 01/27/24  2:56 PM  Result Value Ref Range Status   Enterococcus faecalis NOT DETECTED NOT  DETECTED Final   Enterococcus Faecium NOT DETECTED NOT DETECTED Final   Listeria monocytogenes NOT DETECTED NOT DETECTED Final   Staphylococcus species NOT DETECTED NOT DETECTED Final   Staphylococcus aureus (BCID) NOT DETECTED NOT DETECTED Final   Staphylococcus epidermidis NOT DETECTED NOT DETECTED Final   Staphylococcus lugdunensis NOT DETECTED NOT DETECTED Final   Streptococcus species DETECTED (A) NOT DETECTED Final    Comment: Not Enterococcus species, Streptococcus agalactiae, Streptococcus pyogenes, or Streptococcus pneumoniae. CRITICAL RESULT CALLED TO, READ BACK BY AND VERIFIED WITH: PHARMD TONY R. [id number] AT 1958, ADC    Streptococcus agalactiae NOT DETECTED NOT DETECTED Final   Streptococcus pneumoniae NOT DETECTED NOT DETECTED Final   Streptococcus pyogenes NOT DETECTED NOT DETECTED Final   A.calcoaceticus-baumannii NOT DETECTED NOT DETECTED Final   Bacteroides fragilis NOT DETECTED NOT DETECTED Final   Enterobacterales NOT DETECTED NOT DETECTED Final   Enterobacter cloacae complex NOT DETECTED NOT DETECTED Final   Escherichia coli NOT DETECTED NOT DETECTED Final   Klebsiella aerogenes NOT DETECTED NOT DETECTED Final   Klebsiella oxytoca NOT DETECTED NOT DETECTED Final   Klebsiella pneumoniae NOT DETECTED NOT DETECTED Final   Proteus species NOT DETECTED NOT DETECTED Final   Salmonella species NOT DETECTED NOT DETECTED Final   Serratia marcescens NOT DETECTED NOT DETECTED Final   Haemophilus influenzae NOT DETECTED NOT DETECTED Final   Neisseria meningitidis NOT DETECTED NOT DETECTED Final   Pseudomonas aeruginosa NOT DETECTED NOT DETECTED Final   Stenotrophomonas maltophilia NOT DETECTED NOT DETECTED Final   Candida albicans NOT DETECTED NOT DETECTED Final   Candida auris NOT DETECTED NOT DETECTED Final   Candida glabrata NOT DETECTED NOT DETECTED Final   Candida krusei NOT DETECTED NOT DETECTED Final   Candida parapsilosis NOT DETECTED NOT DETECTED Final   Candida  tropicalis NOT DETECTED NOT DETECTED Final   Cryptococcus neoformans/gattii NOT DETECTED NOT DETECTED Final    Comment: Performed at [facility], [address] [place], [place] [address]  Aerobic/Anaerobic Culture w Gram Stain (surgical/deep wound)     Status: None   Collection Time: 01/27/24  6:14 PM   Specimen: PATH Soft tissue resection  Result Value Ref Range Status   Specimen Description TISSUE  Final   Special Requests LEFT, 1ST RAY AMP  Final   Gram Stain   Final    NO WBC SEEN ABUNDANT GRAM POSITIVE COCCI MODERATE GRAM NEGATIVE RODS RARE GRAM POSITIVE RODS    Culture   Final    MODERATE ENTEROCOCCUS FAECALIS MODERATE STREPTOCOCCUS GROUP F Beta hemolytic streptococci are predictably susceptible to penicillin and other beta lactams. Susceptibility testing not routinely performed. FEW PREVOTELLA SPECIES BETA LACTAMASE POSITIVE Performed at [facility], [address], [place], [place] [address]    Report Status 01/30/2024 FINAL  Final   Organism ID, Bacteria ENTEROCOCCUS FAECALIS  Final      Susceptibility   Enterococcus faecalis - MIC*    AMPICILLIN  <=2 SENSITIVE Sensitive     VANCOMYCIN  1 SENSITIVE Sensitive     GENTAMICIN  SYNERGY SENSITIVE Sensitive     * MODERATE ENTEROCOCCUS FAECALIS  Aerobic/Anaerobic Culture w Gram Stain (surgical/deep wound)     Status: None   Collection Time: 01/27/24  6:15 PM   Specimen: PATH Bone resection; Tissue  Result Value Ref Range Status   Specimen Description BONE  Final   Special Requests LEFT, 1ST RAY BONE  Final   Gram Stain   Final    NO WBC SEEN MODERATE GRAM POSITIVE COCCI MODERATE GRAM NEGATIVE RODS FEW GRAM POSITIVE RODS    Culture   Final    MODERATE ENTEROCOCCUS FAECALIS SUSCEPTIBILITIES PERFORMED ON PREVIOUS CULTURE WITHIN THE LAST 5 DAYS. MODERATE STREPTOCOCCUS GROUP F Beta hemolytic streptococci are predictably susceptible to penicillin and other beta lactams. Susceptibility testing not routinely  performed. FEW PREVOTELLA SPECIES BETA LACTAMASE POSITIVE Performed at [facility], [address] [place], [place] [address]    Report Status 01/30/2024 FINAL  Final  Culture, blood (Routine X 2) w Reflex to ID Panel     Status: None   Collection Time: 01/29/24 10:55 AM   Specimen: BLOOD LEFT ARM  Result Value Ref Range Status   Specimen Description BLOOD LEFT ARM  Final   Special Requests   Final    BOTTLES DRAWN AEROBIC AND ANAEROBIC Blood Culture results may not be optimal due to an inadequate volume of blood received in culture bottles   Culture   Final    NO GROWTH 5 DAYS Performed at [facility], [address], [place], [place] [address]    Report Status 02/03/2024 FINAL  Final  Culture, blood (Routine X 2) w Reflex to ID Panel     Status: None   Collection Time: 01/29/24 10:55 AM   Specimen: BLOOD LEFT ARM  Result Value Ref Range Status   Specimen Description BLOOD LEFT ARM  Final   Special Requests   Final    BOTTLES DRAWN AEROBIC AND ANAEROBIC Blood Culture results may not be optimal due to an inadequate volume of blood received in culture bottles   Culture   Final    NO GROWTH 5 DAYS Performed at [facility], [address] [place], [place] [address]    Report Status 02/03/2024 FINAL  Final         Radiology Studies: No results found.       Scheduled Meds:  acetaminophen   650 mg Rectal Once   amoxicillin -clavulanate  1 tablet Oral Q12H   apixaban   5 mg Oral BID   gabapentin   300 mg Oral TID   insulin  aspart  0-15 Units Subcutaneous TID WC   insulin  aspart  0-5 Units Subcutaneous QHS   insulin  aspart  10 Units Subcutaneous TID WC   insulin  glargine-yfgn  30 Units Subcutaneous Q24H   lisinopril   20 mg Oral Daily   metoCLOPramide (REGLAN) injection  5 mg Intravenous Q6H   mupirocin  ointment   Topical Daily   pantoprazole   40 mg Oral Q0600   pneumococcal 20-valent conjugate vaccine  0.5 mL Intramuscular Tomorrow-1000    polyethylene glycol  17 g Oral Daily   senna-docusate  1 tablet Oral QHS   simethicone   160 mg Oral QID   Continuous Infusions:     LOS: 10  days    Time spent: 35 minutes    Hilda Lovings, MD [facility]   To contact the attending provider between 7A-7P or the covering provider during after hours 7P-7A, please log into the web site [email or web address] and access using universal [place] password for that web site. If you do not have the password, please call the hospital operator.  02/06/2024, 1:25 PM

## 2024-02-06 NOTE — Consult Note (Addendum)
 WOC Nurse Consult Note: Vac intact to left foot with good seal at cont suction.  It is covered by an ace wrap to the LLE wound. It was changed yesterday, according to the patient, related to machine reading "blockage." Discussed plan of care with case manager and patient plans to discharge tomorrow to a SNF, which has another brand of Negative pressure wound therapy.   I will plan to come and remove the previous dressing tomorrow when the patient plans to discharge and apply Mepitel and moist gauze and the facility can apply their brand of NPWT tomorrow after the transfer.   Case management notified [employer] and they stated they will come and pick up the previous supplies which were delivered to the room.  Thank-you,  Wiliam Harder MSN, RN, CWOCN, [place], CNS [phone]

## 2024-02-07 ENCOUNTER — Inpatient Hospital Stay (HOSPITAL_COMMUNITY)

## 2024-02-07 ENCOUNTER — Telehealth: Payer: Self-pay

## 2024-02-07 ENCOUNTER — Encounter (HOSPITAL_BASED_OUTPATIENT_CLINIC_OR_DEPARTMENT_OTHER): Admitting: Internal Medicine

## 2024-02-07 DIAGNOSIS — M7989 Other specified soft tissue disorders: Secondary | ICD-10-CM | POA: Diagnosis not present

## 2024-02-07 DIAGNOSIS — R600 Localized edema: Secondary | ICD-10-CM | POA: Diagnosis not present

## 2024-02-07 DIAGNOSIS — R7881 Bacteremia: Secondary | ICD-10-CM | POA: Diagnosis not present

## 2024-02-07 DIAGNOSIS — K5900 Constipation, unspecified: Secondary | ICD-10-CM | POA: Diagnosis not present

## 2024-02-07 DIAGNOSIS — T8140XA Infection following a procedure, unspecified, initial encounter: Secondary | ICD-10-CM | POA: Diagnosis not present

## 2024-02-07 DIAGNOSIS — R110 Nausea: Secondary | ICD-10-CM | POA: Diagnosis not present

## 2024-02-07 DIAGNOSIS — I96 Gangrene, not elsewhere classified: Secondary | ICD-10-CM | POA: Diagnosis not present

## 2024-02-07 DIAGNOSIS — I10 Essential (primary) hypertension: Secondary | ICD-10-CM | POA: Diagnosis not present

## 2024-02-07 DIAGNOSIS — M869 Osteomyelitis, unspecified: Secondary | ICD-10-CM | POA: Diagnosis not present

## 2024-02-07 DIAGNOSIS — S91302A Unspecified open wound, left foot, initial encounter: Secondary | ICD-10-CM | POA: Diagnosis not present

## 2024-02-07 LAB — COMPREHENSIVE METABOLIC PANEL WITH GFR
ALT: 12 U/L (ref 0–44)
AST: 16 U/L (ref 15–41)
Albumin: 2.5 g/dL — ABNORMAL LOW (ref 3.5–5.0)
Alkaline Phosphatase: 61 U/L (ref 38–126)
Anion gap: 9 (ref 5–15)
BUN: 10 mg/dL (ref 6–20)
CO2: 26 mmol/L (ref 22–32)
Calcium: 9.5 mg/dL (ref 8.9–10.3)
Chloride: 96 mmol/L — ABNORMAL LOW (ref 98–111)
Creatinine, Ser: 0.93 mg/dL (ref 0.61–1.24)
GFR, Estimated: >60 mL/min (ref >60)
Glucose, Bld: 309 mg/dL — ABNORMAL HIGH (ref 70–99)
Potassium: 4.1 mmol/L (ref 3.5–5.1)
Sodium: 131 mmol/L — ABNORMAL LOW (ref 135–145)
Total Bilirubin: 0.6 mg/dL (ref 0.0–1.2)
Total Protein: 8.2 g/dL — ABNORMAL HIGH (ref 6.5–8.1)

## 2024-02-07 LAB — CBC
HCT: 39.3 % (ref 39.0–52.0)
Hemoglobin: 12.7 g/dL — ABNORMAL LOW (ref 13.0–17.0)
MCH: 28.9 pg (ref 26.0–34.0)
MCHC: 32.3 g/dL (ref 30.0–36.0)
MCV: 89.3 fL (ref 80.0–100.0)
Platelets: 647 10*3/uL — ABNORMAL HIGH (ref 150–400)
RBC: 4.4 MIL/uL (ref 4.22–5.81)
RDW: 12.8 % (ref 11.5–15.5)
WBC: 12.9 10*3/uL — ABNORMAL HIGH (ref 4.0–10.5)
nRBC: 0 % (ref 0.0–0.2)

## 2024-02-07 LAB — GLUCOSE, CAPILLARY
Glucose-Capillary: 263 mg/dL — ABNORMAL HIGH (ref 70–99)
Glucose-Capillary: 273 mg/dL — ABNORMAL HIGH (ref 70–99)
Glucose-Capillary: 181 mg/dL — ABNORMAL HIGH (ref 70–99)
Glucose-Capillary: 158 mg/dL — ABNORMAL HIGH (ref 70–99)

## 2024-02-07 MED ORDER — IOHEXOL 350 MG/ML SOLN
75.0000 mL | Freq: Once | INTRAVENOUS | Status: AC | PRN
  Administered 2024-02-07: 75 mL via INTRAVENOUS

## 2024-02-07 NOTE — Consult Note (Signed)
 WOC Nurse wound follow up Vac intact with good seal to left foot full thickness post-op wound.  Area not visualized, dressing was previously changed earlier this week by the bedside nurse related to leaking and next dressing is scheduled to occur on Fri.  Pt plans to discharge today and will have home health assistance with changes.  Provided with a sheet of Mepitel for use at home and attached [employer] home Vac machine to 125mm cont suction.  Please re-consult if further assistance is needed.  Thank-you,  Wiliam Harder MSN, RN, CWOCN, [place], CNS [phone]

## 2024-02-07 NOTE — Telephone Encounter (Signed)
 Daniella with [employer] called wanting to know what home health agency will be in charge of changing wound vac. Call [phone] and [phone]

## 2024-02-07 NOTE — Plan of Care (Signed)
  Problem: Education: Goal: Knowledge of General Education information will improve Description: Including pain rating scale, medication(s)/side effects and non-pharmacologic comfort measures Outcome: Progressing   Problem: Elimination: Goal: Will not experience complications related to bowel motility Outcome: Progressing   Problem: Pain Managment: Goal: General experience of comfort will improve and/or be controlled Outcome: Progressing   Problem: Safety: Goal: Ability to remain free from injury will improve Outcome: Progressing   Problem: Metabolic: Goal: Ability to maintain appropriate glucose levels will improve Outcome: Progressing   Problem: Skin Integrity: Goal: Risk for impaired skin integrity will decrease Outcome: Progressing

## 2024-02-07 NOTE — TOC Progression Note (Signed)
 Transition of Care [facility]) - Progression Note    Patient Details  Name: Dakota Becker MRN: [id number] Date of Birth: 12/29/1975  [facility] CM/SW Contact  Arron Big, [place] Phone Number: 02/07/2024, 11:44 AM  Clinical Narrative:   CSW spoke with Janeann Mean to follow up on conversation with patients wife. Janeann Mean informed CSW that they spoke and [facility] rquires 30 days of payment upfront. Per Voncille Guadalajara, that is not feasible for family at the moment. CSW informed Tameka that most SNFs have the same policy. At this time, Voncille Guadalajara stated that she will take patient home and would like HHRN/PT set up. CSW notified treatment team.  TOC will continue to follow.     Expected Discharge Plan: Home w Home Health Services Barriers to Discharge: Continued Medical Work up  Expected Discharge Plan and Services In-house Referral: Clinical Social Work     Living arrangements for the past 2 months: Single Family Home                                       Social Determinants of Health (SDOH) Interventions SDOH Screenings   Food Insecurity: No Food Insecurity (01/27/2024)  Housing: Low Risk  (01/27/2024)  Transportation Needs: No Transportation Needs (01/27/2024)  Utilities: Not At Risk (01/27/2024)  Social Connections: Socially Integrated (01/27/2024)  Tobacco Use: Low Risk  (02/02/2024)    Readmission Risk Interventions     No data to display

## 2024-02-07 NOTE — Progress Notes (Signed)
 Physical Therapy Treatment Patient Details Name: Dakota Becker MRN: [id number] DOB: 1975/12/26 Today's Date: 02/07/2024   History of Present Illness Pt is a 48 y.o. male presenting 5/3 with L foot pain, blisters on L great toe. Found to have gangrene, osteomyelitis of first ray and 2nd toe. 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 Underwent L foot partial second MT resection and wound vac placement on 01/29/24.  PMH significant for DM, HTN, HLD.    PT Comments  Per pt he will not be able to go to SNF unless he stays 30 days and given family will be paying out of pocket, unable to afford at this time. Pt does exceptionally well with therapy today. He is supervision for bed mobility and transfers. And is able to ambulate 120 feet in hallway using Rolator, demonstrating appropriate use of Rolator for seated rest break. Pt is also able to bump up and down steps on his bottom with use of rail on his R at a contact guard level, including transfer to Rolator at top and bottom of the stairs. PT recommending HHPT at discharge. PT will continue to follow acutely.    If plan is discharge home, recommend the following: A little help with walking and/or transfers;A little help with bathing/dressing/bathroom;Assistance with cooking/housework;Assist for transportation;Help with stairs or ramp for entrance   Can travel by private vehicle     Yes  Equipment Recommendations  BSC/3in1;Rollator (4 wheels);Other (comment) (tub bench)       Precautions / Restrictions Precautions Precautions: Fall Recall of Precautions/Restrictions: Intact Precaution/Restrictions Comments: watch HR Required Braces or Orthoses: Other Brace Other Brace: post-op shoe on L Restrictions Weight Bearing Restrictions Per Provider Order: Yes LLE Weight Bearing Per Provider Order: Partial weight bearing LLE Partial Weight Bearing Percentage or Pounds: Heel only     Mobility  Bed  Mobility Overal bed mobility: Needs Assistance Bed Mobility: Supine to Sit     Supine to sit: Supervision     General bed mobility comments: sitting on EOB on entry with OT    Transfers Overall transfer level: Needs assistance Equipment used: Rollator (4 wheels) Transfers: Sit to/from Stand Sit to Stand: Supervision           General transfer comment: pt able to stand from steps to Rollator, and then able to perform safe sitting and standing from Rollator braced against wall    Ambulation/Gait Ambulation/Gait assistance: Contact guard assist, +2 safety/equipment (Chair Follow) Gait Distance (Feet): 120 Feet Assistive device: Rollator (4 wheels) Gait Pattern/deviations: Step-to pattern, Decreased stride length, Decreased step length - right, Decreased stance time - left, Step-through pattern Gait velocity: decreased Gait velocity interpretation: <1.8 ft/sec, indicate of risk for recurrent falls   General Gait Details: pt with good adherence to weightbearing status, taking longer steps, cues for looking up and out, educated on safe use of Rollator, pt able to demonstrate safe use of Rollator   Stairs Stairs: Yes Stairs assistance: Contact guard assist Stair Management: One rail Right, Backwards, Step to pattern, Seated/boosting Number of Stairs: 10 General stair comments: given set up of stairwell therapy and patient agreed that scooting up the steps on his bottom is the best option for stability and maintaining weightbearing through heel, pt demonstrated good sequencing, able to stand on second to top step and sit back onto Rollator, and then sequence bumping back down steps and standing to Rollator at the bottom. Pt will need someone to carry the Rollator  up and down the steps.       Balance Overall balance assessment: Needs assistance Sitting-balance support: Feet supported Sitting balance-Leahy Scale: Good     Standing balance support: Bilateral upper extremity  supported, Reliant on assistive device for balance, During functional activity Standing balance-Leahy Scale: Poor Standing balance comment: Pt dependent on RW                            Communication Communication Communication: No apparent difficulties  Cognition Arousal: Alert Behavior During Therapy: WFL for tasks assessed/performed   PT - Cognitive impairments: No apparent impairments                         Following commands: Intact      Cueing Cueing Techniques: Verbal cues     General Comments General comments (skin integrity, edema, etc.): max noted HR with stair training 152 bpm, no increased diaphoresis with therapy today      Pertinent Vitals/Pain Pain Assessment Pain Assessment: Faces Faces Pain Scale: Hurts a little bit Pain Location: LLE Pain Descriptors / Indicators: Operative site guarding, Discomfort Pain Intervention(s): Monitored during session, Premedicated before session     PT Goals (current goals can now be found in the care plan section) Acute Rehab PT Goals Patient Stated Goal: Bounce back to my independence PT Goal Formulation: With patient/family Time For Goal Achievement: 02/13/24 Potential to Achieve Goals: Good Progress towards PT goals: Progressing toward goals    Frequency    Min 3X/week       AM-PAC PT "6 Clicks" Mobility   Outcome Measure  Help needed turning from your back to your side while in a flat bed without using bedrails?: A Little Help needed moving from lying on your back to sitting on the side of a flat bed without using bedrails?: A Little Help needed moving to and from a bed to a chair (including a wheelchair)?: A Little Help needed standing up from a chair using your arms (e.g., wheelchair or bedside chair)?: A Little Help needed to walk in hospital room?: A Little Help needed climbing 3-5 steps with a railing? : A Little 6 Click Score: 18    End of Session Equipment Utilized During  Treatment: Gait belt;Other (comment) (post-op shoe) Activity Tolerance: Patient tolerated treatment well Patient left: in chair;with call bell/phone within reach;with family/visitor present Nurse Communication: Mobility status;Other (comment) (tachycardia and R medial great toe wound) PT Visit Diagnosis: Difficulty in walking, not elsewhere classified (R26.2);Pain Pain - Right/Left: Left Pain - part of body: Ankle and joints of foot     Time: 5284-1324 PT Time Calculation (min) (ACUTE ONLY): 25 min  Charges:    $Gait Training: 23-37 mins PT General Charges $$ ACUTE PT VISIT: 1 Visit                     Tamecca Artiga B. Jewel Mortimer PT, DPT Acute Rehabilitation Services Please use secure chat or  Call Office [phone]    Verlie Glisson Fleet 02/07/2024, 10:00 AM

## 2024-02-07 NOTE — Care Management Important Message (Signed)
 Important Message  Patient Details  Name: Dakota Becker MRN: [id number] Date of Birth: 01-13-1976   Important Message Given:  Yes - Medicare IM     Janith Melnick 02/07/2024, 9:55 AM

## 2024-02-07 NOTE — Consult Note (Signed)
 ORTHOPAEDIC CONSULTATION  REQUESTING PHYSICIAN: Etter Hermann., *  Chief Complaint: Left lower leg swelling  HPI: Dakota Becker is a 48 y.o. male who developed painful nodules on the left lower leg about 2 weeks ago.  Then became septic and developed a severe DFU that required surgical treatment and ray amp by podiatry.  Patient was noted to have 3 tender nodules prior to discharge.  He states that since being on abx, the nodules have significantly improved.  Ortho consulted.  Past Medical History:  Diagnosis Date   Diabetes mellitus without complication (HCC)    Hyperlipidemia    Hypertension    Past Surgical History:  Procedure Laterality Date   AMPUTATION Left 01/27/2024   Procedure: Partial first ray amp, gas gangrene;  Surgeon: Evertt Hoe, DPM;  Location: [facility] OR;  Service: Orthopedics/Podiatry;  Laterality: Left;  Partial first ray amputation, gas gangrene with Antibiotic Bead Placement   AMPUTATION TOE Left 01/27/2024   Procedure: AMPUTATION 2nd Toe;  Surgeon: Evertt Hoe, DPM;  Location: [facility] OR;  Service: Orthopedics/Podiatry;  Laterality: Left;   APPENDECTOMY     INCISION AND DRAINAGE OF WOUND Left 01/29/2024   Procedure: Left foot I&D, integra graft, vac app;  Surgeon: Evertt Hoe, DPM;  Location: [facility] OR;  Service: Orthopedics/Podiatry;  Laterality: Left;  Left foot I&D, integra graft, vac app   TRANSESOPHAGEAL ECHOCARDIOGRAM (CATH LAB) N/A 02/02/2024   Procedure: TRANSESOPHAGEAL ECHOCARDIOGRAM;  Surgeon: Maudine Sos, MD;  Location: [facility] INVASIVE CV LAB;  Service: Cardiovascular;  Laterality: N/A;   Social History   Socioeconomic History   Marital status: Married    Spouse name: Not on file   Number of children: Not on file   Years of education: Not on file   Highest education level: Not on file  Occupational History   Not on file  Tobacco Use   Smoking status: Never   Smokeless tobacco: Never  Vaping Use   Vaping status:  Never Used  Substance and Sexual Activity   Alcohol use: Yes    Alcohol/week: 0.0 - 2.0 standard drinks of alcohol   Drug use: No   Sexual activity: Not on file  Other Topics Concern   Not on file  Social History Narrative   Not on file   Social Drivers of Health   Financial Resource Strain: Not on file  Food Insecurity: No Food Insecurity (01/27/2024)   Hunger Vital Sign    Worried About Running Out of Food in the Last Year: Never true    Ran Out of Food in the Last Year: Never true  Transportation Needs: No Transportation Needs (01/27/2024)   PRAPARE - Administrator, Civil Service (Medical): No    Lack of Transportation (Non-Medical): No  Physical Activity: Not on file  Stress: Not on file  Social Connections: Socially Integrated (01/27/2024)   Social Connection and Isolation Panel [NHANES]    Frequency of Communication with Friends and Family: More than three times a week    Frequency of Social Gatherings with Friends and Family: More than three times a week    Attends Religious Services: More than 4 times per year    Active Member of [employer] or Organizations: Yes    Attends Engineer, structural: More than 4 times per year    Marital Status: Married   Family History  Problem Relation Age of Onset   Diabetes Mother    Diabetes Father    - negative except  otherwise stated in the family history section No Known Allergies Prior to Admission medications   Medication Sig Start Date End Date Taking? Authorizing Provider  gabapentin  (NEURONTIN ) 300 MG capsule Take 300 mg by mouth 3 (three) times daily.   Yes [provider]  glipiZIDE (GLUCOTROL XL) 10 MG 24 hr tablet Take 10 mg by mouth in the morning and at bedtime.   Yes [provider]  JARDIANCE 25 MG TABS tablet Take 10 mg by mouth daily. 05/21/20  Yes [provider]  lisinopril  (ZESTRIL ) 20 MG tablet Take 20 mg by mouth daily.   Yes [provider]  Semaglutide  (RYBELSUS) 3 MG TABS Take 3 mg by mouth daily at 12 noon. 11/01/22  Yes [provider]   CT TIBIA FIBULA LEFT W CONTRAST Result Date: 02/07/2024 CLINICAL DATA:  concern for soft tissue infection. Status post partial first and second ray amputation the left foot. EXAM: CT OF THE LOWER LEFT EXTREMITY WITH CONTRAST TECHNIQUE: Multidetector CT imaging of the lower left extremity was performed according to the standard protocol following intravenous contrast administration. RADIATION DOSE REDUCTION: This exam was performed according to the departmental dose-optimization program which includes automated exposure control, adjustment of the mA and/or kV according to patient size and/or use of iterative reconstruction technique. CONTRAST:  75mL OMNIPAQUE IOHEXOL 350 MG/ML SOLN COMPARISON:  Left foot radiographs dated 01/29/2024. FINDINGS: Bones/Joint/Cartilage Status post amputation of the first ray to the level of the proximal metatarsal shaft. Transsection margin appears intact. Known secondary ray transsection margin not included within the field of view. No evidence of acute osteolysis or erosive changes. No acute fracture or dislocation. Ligaments Ligaments are suboptimally evaluated by CT. Soft tissue / Muscles and Tendons Postsurgical changes with soft tissue swelling, soft tissue air and cutaneous staples at the distal medial foot amputation stump. Diffuse circumferential subcutaneous edema extending from the left proximal to mid calf/shin distally through the foot. A few regions of focal cutaneous prominence along the medial mid to distal shin are nonspecific and could reflect blisters. There is a 3.8 x 2.2 x 0.7 cm irregular curvilinear fluid collection of indeterminate sterility within the medial subcutaneous soft tissues of the ankle, overlying the superficial margin of the posterior tibialis tendon (series 4, image 334 and series 9, image 50). Abscess cannot be excluded. IMPRESSION: 1. Diffuse  nonspecific circumferential subcutaneous edema extending from the visualized left foot to the proximal calf/shin, may reflect cellulitis in the appropriate setting. There is a 3.8 x 2.2 x 0.7 cm irregular curvilinear fluid collection of indeterminate sterility within the medial subcutaneous soft tissues of the ankle, overlying the superficial margin of the posterior tibialis tendon. Abscess cannot be excluded. 2. Postsurgical changes related to recent partial amputations of the first and second rays of the left foot. The proximal first metatarsal transsection margin appears intact. Known second ray transsection margin is not included within the field of view. No acute osseous abnormality identified. Electronically Signed   By: Mannie Seek M.D.   On: 02/07/2024 16:06   NM GASTRIC EMPTYING Result Date: 02/06/2024 CLINICAL DATA:  Concern for gastroparesis. EXAM: NUCLEAR MEDICINE GASTRIC EMPTYING SCAN TECHNIQUE: After oral ingestion of radiolabeled meal, sequential abdominal images were obtained for 120 minutes. Residual percentage of activity remaining within the stomach was calculated at 60 and 120 minutes. RADIOPHARMACEUTICALS:  2.2 mCi Tc-20m sulfur colloid in standardized meal COMPARISON:  No recent relevant priors available for comparison. FINDINGS: Expected location of the stomach in the left upper quadrant.  Ingested meal empties the stomach gradually over the course of the study with 14% retention at 60 min and 5% retention at 120 min (normal retention less than 30% at a 120 min). IMPRESSION: Normal gastric emptying study. Electronically Signed   By: Tama Fails M.D.   On: 02/06/2024 14:44   - pertinent xrays, CT, MRI studies were reviewed and independently interpreted  Positive ROS: All other systems have been reviewed and were otherwise negative with the exception of those mentioned in the HPI and as above.  Physical Exam: General: No acute distress Cardiovascular: No pedal  edema Respiratory: No cyanosis, no use of accessory musculature GI: No organomegaly, abdomen is soft and non-tender Skin: No lesions in the area of chief complaint Neurologic: Sensation intact distally Psychiatric: Patient is at baseline mood and affect Lymphatic: No axillary or cervical lymphadenopathy  MUSCULOSKELETAL:  Left lower leg - 3 1 cm nodules, soft, mildly tender, no surrounding cellulitis -   Assessment: Left lower leg nodules  Plan: - probable small abscesses that have significantly improved and responded to abx therapy.  Would recommend continuing abx and finish out 6 week course of augmentin  recommended by ID for the foot which will be sufficient for the abscesses - patient instructed to return to ED if he notices any worsening - plan discussed with Dr. Ada Acres. - patient is stable for d/c from ortho perspective.  Thank you for the consult and the opportunity to see Mr. Sherryn Donalds Claria Crofts, MD [facility] 5:35 PM

## 2024-02-07 NOTE — Progress Notes (Signed)
 PROGRESS NOTE    Dakota Becker  ZOX:[id number] DOB: [birth year] DOA: 01/27/2024 PCP: Dakota Hurt, FNP  Chief Complaint  Patient presents with   Foot Pain   Wound Infection   Code Sepsis    Brief Narrative:   Dakota Becker is Dakota Becker 48 y.o. male with medical history significant of diabetes, essential hypertension, hyperlipidemia, with great toe ulcer developed some blisters.  Not taking his diabetic medication.  He went to PCP and was put on oral antibiotics and referred to podiatry.  Recommended wound management but he never got contacted so started having increasing swelling in the left toe with pain for 5 days or so with some chills and nausea ,vomiting and foul odor.  Patient then presented to [facility].  Upon arrival to the ED podiatry was consulted.  Patient  met sepsis criteria secondary to osteomyelitis of the left great toe.  Patient was then taken to the OR and underwent left hallux amputation.  Patient underwent repeat debridement on 01/29/2024 by podiatry and ID  has been consulted for antibiotic management..   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 CT with nec fasciitis of the great toe with findings of emphysematous osteomyelitis or osteonecrosis S/p partial first ray amputation of L foot, 2nd toe amputation at MPJ level of L foot, application of dissolvable abx  beads left foot 5/3 5/5 had repeat I&D partial second metatarsal resection left foot, dermal allograft application 5x4 cm L foot, application negative pressure wound therapy 5x4 cm L foot TEE with EF 60-65%, no RWMA, no evidence of vegetation/infective endocarditis  Blood culture 5/3 with strep constellatus  Blood cultures 5/5 NGTD x5 Wound culture with moderate enterococcus faecalis (amp sensitive), moderate strep group F, few prevotella, beta lactamase positive Seen by ID 5/9, recommending augmentin  to complete 6 weeks from negative blood cultures.  Has ID follow up on 6/3.   Today, had nodules to left leg, concerning for abscesses (he notes this has gradually improved with size and pain).  CT showed 3.8x2.2x0.7 cm irregular curvilinear fluid collection of indeterminate sterility.  Discussed with ortho, Dr. Christiane Cowing, recommending continuing abx and finish 6 week course of augmentin  - no plan for any drainage.   Transition to heel WB in post op shoe for short distance transfers, follow up in 2 weeks in office (last podiatry note 5/9)  Left Lower Extremity DVT Presume this was provoked in setting of infection above Needs at least 3 months anticoagulation  Nausea  Poor PO intake Normal gastric emptying study On scheduled reglan   Constipation Bowel regimen   Hypertension Lisinopril   Obesity Body mass index is 29.04 kg/m.  Hypercalcemia resolved      DVT prophylaxis: eliquis  Code Status: full Family Communication: none Disposition:   Status is: Inpatient Remains inpatient appropriate because: need for ongoing inpatient care   Consultants:  Ortho ID Podiatry cardiology  Procedures:  5/5 Procedure:  1.  Repeat irrigation and debridement partial second metatarsal resection, left foot 2.  Dermal allograft application 5 x 4 cm, left foot 3.  Application negative pressure wound therapy 5 x 4 cm, left foot  5/3 Procedure:  1. Partial first ray amputation of left foot 2.  2nd toe amputation at MPJ level of left foot 3. Application dissolvable antibiotic beads left foot  Antimicrobials:  Anti-infectives (From admission, onward)    Start     Dose/Rate Route Frequency Ordered Stop   02/02/24 2200  amoxicillin -clavulanate (AUGMENTIN ) 875-125 MG per tablet 1 tablet        1 tablet Oral Every 12 hours 02/02/24 1533 03/12/24 0959   01/30/24 1200  Ampicillin -Sulbactam (UNASYN ) 3 g in sodium chloride  0.9 % 100 mL IVPB  Status:  Discontinued        3 g 200 mL/hr over 30 Minutes Intravenous Every 6 hours 01/30/24 1103 02/02/24 1533   01/29/24 2200  linezolid  (ZYVOX ) tablet 600 mg  Status:  Discontinued        600 mg Oral Every 12 hours 01/29/24 1040 02/02/24 1530   01/29/24 1130  cefTRIAXone  (ROCEPHIN ) 2 g in sodium chloride  0.9 % 100 mL IVPB  Status:  Discontinued        2 g 200 mL/hr over 30 Minutes Intravenous Every 24 hours 01/29/24 1036 01/30/24 1103   01/28/24 1130  piperacillin -tazobactam (ZOSYN ) IVPB 3.375 g  Status:  Discontinued        3.375 g 12.5 mL/hr over 240 Minutes Intravenous Every 8 hours 01/28/24 1035 01/29/24 1036   01/28/24 1000  metroNIDAZOLE  (FLAGYL ) IVPB 500 mg  Status:  Discontinued        500 mg 100 mL/hr over 60 Minutes Intravenous Every 12 hours 01/28/24 0925 01/28/24 1035   01/27/24 2200  linezolid  (ZYVOX ) IVPB 600 mg  Status:  Discontinued        600 mg 300 mL/hr over 60 Minutes Intravenous Every 12 hours 01/27/24 1953 01/27/24 2007   01/27/24 1835  vancomycin  (VANCOCIN ) powder  Status:  Discontinued          As needed 01/27/24 1835 01/27/24 1843   01/27/24 1834  tobramycin  (NEBCIN ) injection  Status:  Discontinued          As needed 01/27/24 1835 01/27/24 1843   01/27/24 1515  linezolid  (ZYVOX ) IVPB 600 mg  Status:  Discontinued        600 mg 300 mL/hr over 60 Minutes Intravenous Every 12 hours 01/27/24 1502 01/29/24 1036   01/27/24 1515  piperacillin -tazobactam (ZOSYN ) IVPB 3.375 g        3.375 g 100 mL/hr over 30 Minutes  Intravenous  Once 01/27/24 1505 01/27/24 1635   01/27/24 1430  vancomycin  (VANCOCIN ) IVPB 1000 mg/200 mL premix  Status:  Discontinued       Placed in "Followed by" Linked Group   1,000 mg 200 mL/hr over 60 Minutes Intravenous  Once 01/27/24 1419 01/27/24 1502   01/27/24 1430  vancomycin  (VANCOCIN ) IVPB 1000 mg/200 mL premix  Status:  Discontinued       Placed in "Followed by" Linked Group   1,000 mg 200 mL/hr over 60 Minutes Intravenous  Once 01/27/24 1419 01/27/24 1502   01/27/24 1415  Ampicillin -Sulbactam (UNASYN ) 3 g in sodium chloride  0.9 % 100 mL IVPB  Status:  Discontinued       Placed in "And" Linked Group   3 g 200 mL/hr over 30 Minutes Intravenous  Once 01/27/24 1412 01/27/24 1635  01/27/24 1415  vancomycin  (VANCOCIN ) IVPB 1000 mg/200 mL premix  Status:  Discontinued       Placed in "And" Linked Group   1,000 mg 200 mL/hr over 60 Minutes Intravenous  Once 01/27/24 1412 01/27/24 1419       Subjective: Pain is improving  Objective: Vitals:   02/07/24 0510 02/07/24 0725 02/07/24 1004 02/07/24 1031  BP: 122/86 (!) 144/83 (!) 132/90 (!) 153/92  Pulse: (!) 105 96  99  Resp: 17 20  19   Temp: 99.2 F (37.3 C) 99.4 F (37.4 C)  99.2 F (37.3 C)  TempSrc: Oral Oral  Oral  SpO2: 95% 95%  99%  Weight:      Height:        Intake/Output Summary (Last 24 hours) at 02/07/2024 1857 Last data filed at 02/07/2024 1753 Gross per 24 hour  Intake 236 ml  Output 550 ml  Net -314 ml   Filed Weights   02/02/24 0532 02/05/24 0421 02/06/24 0503  Weight: 103.8 kg 101.7 kg 99.8 kg    Examination:  General exam: Appears calm and comfortable  Respiratory system: unlabored Cardiovascular system: RRR Central nervous system: Alert and oriented. No focal neurological deficits. Extremities: LLE with dressing, L shin with 3 1-2 cm nodules with mild fluctuance   Data Reviewed: I have personally reviewed following labs and imaging studies  CBC: Recent Labs  Lab 02/01/24 0250  02/02/24 0244 02/03/24 0314 02/04/24 0243 02/05/24 0257 02/06/24 0319 02/07/24 0942  WBC 15.0* 12.0* 11.8* 12.7* 11.9* 10.6* 12.9*  NEUTROABS 11.7* 9.2*  --   --   --   --   --   HGB 12.0* 11.4* 11.7* 12.4* 12.3* 11.7* 12.7*  HCT 36.9* 35.3* 35.9* 38.3* 38.2* 36.1* 39.3  MCV 89.3 88.9 89.1 89.5 90.1 89.6 89.3  PLT 443* 447* 448* 551* 538* 519* 647*    Basic Metabolic Panel: Recent Labs  Lab 02/01/24 0250 02/02/24 0244 02/03/24 0314 02/04/24 0243 02/05/24 0257 02/06/24 0319 02/07/24 0942  NA 134* 134* 134* 132* 134* 135 131*  K 4.1 3.8 4.0 4.0 3.8 4.0 4.1  CL 97* 97* 98 94* 93* 98 96*  CO2 24 26 27 27 29 27 26   GLUCOSE 229* 255* 296* 288* 255* 229* 309*  BUN 9 9 6  5* 8 9 10   CREATININE 0.76 0.70 0.74 0.68 0.87 0.77 0.93  CALCIUM 9.2 8.8* 8.9 9.3 9.4 9.1 9.5  MG 2.0 1.9 1.9  --   --   --   --     GFR: Estimated Creatinine Clearance: 119.5 mL/min (by C-G formula based on SCr of 0.93 mg/dL).  Liver Function Tests: Recent Labs  Lab 02/07/24 0942  AST 16  ALT 12  ALKPHOS 61  BILITOT 0.6  PROT 8.2*  ALBUMIN 2.5*    CBG: Recent Labs  Lab 02/06/24 1612 02/06/24 2144 02/07/24 0621 02/07/24 1030 02/07/24 1619  GLUCAP 188* 237* 263* 273* 181*     Recent Results (from the past 240 hours)  Culture, blood (Routine X 2) w Reflex to ID Panel     Status: None   Collection Time: 01/29/24 10:55 AM   Specimen: BLOOD LEFT ARM  Result Value Ref Range Status   Specimen Description BLOOD LEFT ARM  Final   Special Requests   Final    BOTTLES DRAWN AEROBIC AND ANAEROBIC Blood Culture results may not be optimal due to an inadequate volume of blood received in culture bottles   Culture   Final  NO GROWTH 5 DAYS Performed at [facility], [address] [place], [place] [address]    Report Status 02/03/2024 FINAL  Final  Culture, blood (Routine X 2) w Reflex to ID Panel     Status: None   Collection Time: 01/29/24 10:55 AM   Specimen: BLOOD LEFT ARM  Result  Value Ref Range Status   Specimen Description BLOOD LEFT ARM  Final   Special Requests   Final    BOTTLES DRAWN AEROBIC AND ANAEROBIC Blood Culture results may not be optimal due to an inadequate volume of blood received in culture bottles   Culture   Final    NO GROWTH 5 DAYS Performed at [facility], [address] [place], [place] [address]    Report Status 02/03/2024 FINAL  Final         Radiology Studies: CT TIBIA FIBULA LEFT W CONTRAST Result Date: 02/07/2024 CLINICAL DATA:  concern for soft tissue infection. Status post partial first and second ray amputation the left foot. EXAM: CT OF THE LOWER LEFT EXTREMITY WITH CONTRAST TECHNIQUE: Multidetector CT imaging of the lower left extremity was performed according to the standard protocol following intravenous contrast administration. RADIATION DOSE REDUCTION: This exam was performed according to the departmental dose-optimization program which includes automated exposure control, adjustment of the mA and/or kV according to patient size and/or use of iterative reconstruction technique. CONTRAST:  75mL OMNIPAQUE IOHEXOL 350 MG/ML SOLN COMPARISON:  Left foot radiographs dated 01/29/2024. FINDINGS: Bones/Joint/Cartilage Status post amputation of the first ray to the level of the proximal metatarsal shaft. Transsection margin appears intact. Known secondary ray transsection margin not included within the field of view. No evidence of acute osteolysis or erosive changes. No acute fracture or dislocation. Ligaments Ligaments are suboptimally evaluated by CT. Soft tissue / Muscles and Tendons Postsurgical changes with soft tissue swelling, soft tissue air and cutaneous staples at the distal medial foot amputation stump. Diffuse circumferential subcutaneous edema extending from the left proximal to mid calf/shin distally through the foot. Creig Landin few regions of focal cutaneous prominence along the medial mid to distal shin are nonspecific and could  reflect blisters. There is Dayra Rapley 3.8 x 2.2 x 0.7 cm irregular curvilinear fluid collection of indeterminate sterility within the medial subcutaneous soft tissues of the ankle, overlying the superficial margin of the posterior tibialis tendon (series 4, image 334 and series 9, image 50). Abscess cannot be excluded. IMPRESSION: 1. Diffuse nonspecific circumferential subcutaneous edema extending from the visualized left foot to the proximal calf/shin, may reflect cellulitis in the appropriate setting. There is Trevonn Hallum 3.8 x 2.2 x 0.7 cm irregular curvilinear fluid collection of indeterminate sterility within the medial subcutaneous soft tissues of the ankle, overlying the superficial margin of the posterior tibialis tendon. Abscess cannot be excluded. 2. Postsurgical changes related to recent partial amputations of the first and second rays of the left foot. The proximal first metatarsal transsection margin appears intact. Known second ray transsection margin is not included within the field of view. No acute osseous abnormality identified. Electronically Signed   By: Mannie Seek M.D.   On: 02/07/2024 16:06   NM GASTRIC EMPTYING Result Date: 02/06/2024 CLINICAL DATA:  Concern for gastroparesis. EXAM: NUCLEAR MEDICINE GASTRIC EMPTYING SCAN TECHNIQUE: After oral ingestion of radiolabeled meal, sequential abdominal images were obtained for 120 minutes. Residual percentage of activity remaining within the stomach was calculated at 60 and 120 minutes. RADIOPHARMACEUTICALS:  2.2 mCi Tc-91m sulfur colloid in standardized meal COMPARISON:  No recent relevant priors available for comparison. FINDINGS: Expected location of the stomach in the left upper quadrant. Ingested meal empties the stomach gradually over the course of the study with 14% retention at 60 min and 5% retention at 120 min (normal retention less than 30% at Corayma Cashatt 120 min). IMPRESSION: Normal gastric emptying study. Electronically Signed   By: Tama Fails M.D.   On:  02/06/2024 14:44        Scheduled Meds:  acetaminophen   650 mg Rectal Once   amoxicillin -clavulanate  1 tablet Oral Q12H   apixaban   5 mg Oral BID   gabapentin   300 mg Oral TID   insulin  aspart  0-15 Units Subcutaneous TID WC   insulin  aspart  0-5 Units Subcutaneous QHS   insulin  aspart  10 Units Subcutaneous TID WC   insulin  glargine-yfgn  30 Units Subcutaneous Q24H   lisinopril   20 mg Oral Daily   metoCLOPramide  5 mg Oral TID AC & HS   mupirocin  ointment   Topical Daily   pantoprazole   40 mg Oral Q0600   pneumococcal 20-valent conjugate vaccine  0.5 mL Intramuscular Tomorrow-1000   polyethylene glycol  17 g Oral Daily   senna-docusate  1 tablet Oral QHS   simethicone   160 mg Oral QID   Continuous Infusions:   LOS: 11 days    Time spent: over 30 min    Donnetta Gains, MD [facility]   To contact the attending provider between 7A-7P or the covering provider during after hours 7P-7A, please log into the web site [email or web address] and access using universal [facility] password for that web site. If you do not have the password, please call the hospital operator.  02/07/2024, 6:57 PM

## 2024-02-07 NOTE — TOC Progression Note (Signed)
 Transition of Care [facility]) - Progression Note    Patient Details  Name: MUNTASIR BETHUNE MRN: [id number] Date of Birth: [birth year]  Transition of [facility]) CM/SW Contact  Jennett Model, RN Phone Number: 02/07/2024, 12:03 PM  Clinical Narrative:    NCM was notified by CSW that wife has decided to have patient go home with [facility].  He is set up with [facility] for [facility], HHPT,  the [facility] will be out on Friday for wound vac change.  Patient states he is ok with [employer] for rollator and bsc.  NCM made referral to Jermaine with [employer] ,this will be brought to patient's room.     Expected Discharge Plan: Home w Home Health Services Barriers to Discharge: Continued Medical Work up  Expected Discharge Plan and Services In-house Referral: Clinical Social Work     Living arrangements for the past 2 months: Single Family Home                                       Social Determinants of Health (SDOH) Interventions SDOH Screenings   Food Insecurity: No Food Insecurity (01/27/2024)  Housing: Low Risk  (01/27/2024)  Transportation Needs: No Transportation Needs (01/27/2024)  Utilities: Not At Risk (01/27/2024)  Social Connections: Socially Integrated (01/27/2024)  Tobacco Use: Low Risk  (02/02/2024)    Readmission Risk Interventions     No data to display

## 2024-02-07 NOTE — TOC Transition Note (Signed)
 Transition of Care [facility]) - Discharge Note   Patient Details  Name: Dakota Becker MRN: [id number] Date of Birth: March 20, 1976  Transition of Care [facility]) CM/SW Contact:  Jennett Model, RN Phone Number: 02/07/2024, 12:18 PM   Clinical Narrative:    For dc today, NCM notified Kelly with [facility] , and [employer] will supply rollator and 3 n 1.  Patient states he will purchase a tub bench on his own.       Barriers to Discharge: Continued Medical Work up   Patient Goals and CMS Choice Patient states their goals for this hospitalization and ongoing recovery are:: To get better          Discharge Placement                       Discharge Plan and Services Additional resources added to the After Visit Summary for   In-house Referral: Clinical Social Work                                   Social Drivers of Health (SDOH) Interventions SDOH Screenings   Food Insecurity: No Food Insecurity (01/27/2024)  Housing: Low Risk  (01/27/2024)  Transportation Needs: No Transportation Needs (01/27/2024)  Utilities: Not At Risk (01/27/2024)  Social Connections: Socially Integrated (01/27/2024)  Tobacco Use: Low Risk  (02/02/2024)     Readmission Risk Interventions     No data to display

## 2024-02-07 NOTE — Progress Notes (Signed)
 Occupational Therapy Treatment Patient Details Name: Dakota Becker MRN: [id number] DOB: [birth year] Today's Date: 02/07/2024   History of present illness Pt is a 48 y.o. male presenting 5/3 with L foot pain, blisters on L great toe. Found to have gangrene, osteomyelitis of first ray and 2nd toe. 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 Underwent L foot partial second MT resection and wound vac placement on 01/29/24.  PMH significant for DM, HTN, HLD.   OT comments  Pt presented at the EOB and reported that he will not be able to go to SNF for a short stay visit. He requires to go up 12 steps to enter the home and his father can assist PRN with taking in/out of the home. Pt at this time completed stair climbing with PT/OT with CGA with chair at bottom of steps and 4WW placed at the top of steps with bumping backwards up the steps. Pt then was able to use 4WW back to the room with no LOB and supervision to CGA with educated on 4WW use. He also was educated further on DME equipment use and was also administered gait belt and red theraband. At this time recommendation for [facility] services due to decline with SNF short stay.       If plan is discharge home, recommend the following:  A little help with walking and/or transfers;A little help with bathing/dressing/bathroom;Assistance with cooking/housework;Assist for transportation;Help with stairs or ramp for entrance   Equipment Recommendations  BSC/3in1;Tub/shower bench (5MW)    Recommendations for Other Services      Precautions / Restrictions Precautions Precautions: Fall Recall of Precautions/Restrictions: Intact Precaution/Restrictions Comments: watch HR Required Braces or Orthoses: Other Brace Other Brace: post-op shoe on L Restrictions Weight Bearing Restrictions Per Provider Order: Yes LLE Weight Bearing Per Provider Order: Partial weight bearing LLE Partial Weight Bearing Percentage or Pounds: Heel  only       Mobility Bed Mobility Overal bed mobility: Needs Assistance Bed Mobility: Sit to Supine       Sit to supine: Modified independent (Device/Increase time)   General bed mobility comments: sitting on EOB    Transfers Overall transfer level: Needs assistance Equipment used: Rollator (4 wheels), Rolling walker (2 wheels) Transfers: Sit to/from Stand Sit to Stand: Supervision Stand pivot transfers: Contact guard assist   Step pivot transfers: Contact guard assist     General transfer comment: pt able to stand from steps to Rollator, and then able to perform safe sitting and standing from Rollator braced against wall     Balance Overall balance assessment: Needs assistance Sitting-balance support: Feet supported Sitting balance-Leahy Scale: Good     Standing balance support: Bilateral upper extremity supported, Reliant on assistive device for balance, During functional activity Standing balance-Leahy Scale: Poor Standing balance comment: Pt dependent on RW                           ADL either performed or assessed with clinical judgement   ADL Overall ADL's : Needs assistance/impaired Eating/Feeding: Independent;Sitting   Grooming: Wash/dry hands;Set up;Sitting   Upper Body Bathing: Set up;Sitting   Lower Body Bathing: Minimal assistance   Upper Body Dressing : Set up;Sitting   Lower Body Dressing: Minimal assistance;Sit to/from stand;Sitting/lateral leans Lower Body Dressing Details (indicate cue type and reason): due to wound vac need assist with start dressing over vac then completed rest Toilet Transfer: Contact guard assist;Rolling walker (  2 wheels)   Toileting- Clothing Manipulation and Hygiene: Supervision/safety;Cueing for safety       Functional mobility during ADLs: Contact guard assist;Rolling walker (2 wheels);Rollator (4 wheels)      Extremity/Trunk Assessment Upper Extremity Assessment Upper Extremity Assessment: Overall  WFL for tasks assessed   Lower Extremity Assessment Lower Extremity Assessment: Defer to PT evaluation        Vision   Vision Assessment?: Wears glasses for reading   Perception Perception Perception: Within Functional Limits   Praxis Praxis Praxis: [facility]   Communication Communication Communication: No apparent difficulties   Cognition Arousal: Alert Behavior During Therapy: WFL for tasks assessed/performed                                 Following commands: Intact        Cueing   Cueing Techniques: Verbal cues  Exercises      Shoulder Instructions       General Comments max HR with stairs 152    Pertinent Vitals/ Pain       Pain Assessment Pain Assessment: Faces Faces Pain Scale: Hurts a little bit Breathing: normal Pain Location: LLE Pain Descriptors / Indicators: Operative site guarding, Discomfort Pain Intervention(s): Limited activity within patient's tolerance  Home Living                                          Prior Functioning/Environment              Frequency  Min 2X/week        Progress Toward Goals  OT Goals(current goals can now be found in the care plan section)  Progress towards OT goals: Progressing toward goals  Acute Rehab OT Goals Patient Stated Goal: to do the stairs OT Goal Formulation: With patient Time For Goal Achievement: 02/14/24 Potential to Achieve Goals: Good ADL Goals Pt Will Perform Lower Body Bathing: with modified independence;sitting/lateral leans;sit to/from stand Pt Will Perform Lower Body Dressing: sitting/lateral leans;sit to/from stand Pt Will Transfer to Toilet: with supervision;ambulating Pt Will Perform Tub/Shower Transfer: with modified independence;ambulating;rolling walker;Tub transfer  Plan      Co-evaluation                 AM-PAC OT "6 Clicks" Daily Activity     Outcome Measure   Help from another person eating meals?: None Help from another  person taking care of personal grooming?: A Little Help from another person toileting, which includes using toliet, bedpan, or urinal?: A Little Help from another person bathing (including washing, rinsing, drying)?: A Lot Help from another person to put on and taking off regular upper body clothing?: A Little Help from another person to put on and taking off regular lower body clothing?: A Little 6 Click Score: 18    End of Session Equipment Utilized During Treatment: Rolling walker (2 wheels);Rollator (4 wheels);Gait belt  OT Visit Diagnosis: Unsteadiness on feet (R26.81);Other abnormalities of gait and mobility (R26.89);Repeated falls (R29.6);Muscle weakness (generalized) (M62.81);Pain Pain - Right/Left: Left Pain - part of body: Leg   Activity Tolerance Patient tolerated treatment well   Patient Left in bed;with call bell/phone within reach   Nurse Communication Mobility status        Time: 1610-9604 OT Time Calculation (min): 61 min  Charges: OT General Charges $OT Visit: 1 Visit  OT Treatments $Self Care/Home Management : 23-37 mins  Erving Heather OTR/L  Acute Rehab Services  [phone] office number   Stevphen Elders 02/07/2024, 10:39 AM

## 2024-02-08 ENCOUNTER — Telehealth: Payer: Self-pay

## 2024-02-08 ENCOUNTER — Other Ambulatory Visit (HOSPITAL_COMMUNITY): Payer: Self-pay

## 2024-02-08 DIAGNOSIS — T8140XA Infection following a procedure, unspecified, initial encounter: Secondary | ICD-10-CM | POA: Diagnosis not present

## 2024-02-08 DIAGNOSIS — M869 Osteomyelitis, unspecified: Secondary | ICD-10-CM | POA: Diagnosis not present

## 2024-02-08 DIAGNOSIS — S91302A Unspecified open wound, left foot, initial encounter: Secondary | ICD-10-CM | POA: Diagnosis not present

## 2024-02-08 LAB — COMPREHENSIVE METABOLIC PANEL WITH GFR
ALT: 16 U/L (ref 0–44)
AST: 12 U/L — ABNORMAL LOW (ref 15–41)
Albumin: 2.3 g/dL — ABNORMAL LOW (ref 3.5–5.0)
Alkaline Phosphatase: 60 U/L (ref 38–126)
Anion gap: 9 (ref 5–15)
BUN: 8 mg/dL (ref 6–20)
CO2: 27 mmol/L (ref 22–32)
Calcium: 9.2 mg/dL (ref 8.9–10.3)
Chloride: 99 mmol/L (ref 98–111)
Creatinine, Ser: 0.73 mg/dL (ref 0.61–1.24)
GFR, Estimated: >60 mL/min (ref >60)
Glucose, Bld: 174 mg/dL — ABNORMAL HIGH (ref 70–99)
Potassium: 4.2 mmol/L (ref 3.5–5.1)
Sodium: 135 mmol/L (ref 135–145)
Total Bilirubin: 0.2 mg/dL (ref 0.0–1.2)
Total Protein: 7.4 g/dL (ref 6.5–8.1)

## 2024-02-08 LAB — CBC WITH DIFFERENTIAL/PLATELET
Abs Immature Granulocytes: 0.03 10*3/uL (ref 0.00–0.07)
Basophils Absolute: 0 10*3/uL (ref 0.0–0.1)
Basophils Relative: 0 %
Eosinophils Absolute: 0.1 10*3/uL (ref 0.0–0.5)
Eosinophils Relative: 1 %
HCT: 37.3 % — ABNORMAL LOW (ref 39.0–52.0)
Hemoglobin: 12.2 g/dL — ABNORMAL LOW (ref 13.0–17.0)
Immature Granulocytes: 0 %
Lymphocytes Relative: 20 %
Lymphs Abs: 2 10*3/uL (ref 0.7–4.0)
MCH: 28.6 pg (ref 26.0–34.0)
MCHC: 32.7 g/dL (ref 30.0–36.0)
MCV: 87.6 fL (ref 80.0–100.0)
Monocytes Absolute: 0.7 10*3/uL (ref 0.1–1.0)
Monocytes Relative: 6 %
Neutro Abs: 7.5 10*3/uL (ref 1.7–7.7)
Neutrophils Relative %: 73 %
Platelets: 549 10*3/uL — ABNORMAL HIGH (ref 150–400)
RBC: 4.26 MIL/uL (ref 4.22–5.81)
RDW: 12.8 % (ref 11.5–15.5)
WBC: 10.3 10*3/uL (ref 4.0–10.5)
nRBC: 0 % (ref 0.0–0.2)

## 2024-02-08 LAB — GLUCOSE, CAPILLARY
Glucose-Capillary: 179 mg/dL — ABNORMAL HIGH (ref 70–99)
Glucose-Capillary: 179 mg/dL — ABNORMAL HIGH (ref 70–99)

## 2024-02-08 LAB — MAGNESIUM: Magnesium: 2 mg/dL (ref 1.7–2.4)

## 2024-02-08 LAB — PHOSPHORUS: Phosphorus: 3.8 mg/dL (ref 2.5–4.6)

## 2024-02-08 MED ORDER — BLOOD GLUCOSE TEST VI STRP
1.0000 | ORAL_STRIP | Freq: Three times a day (TID) | 0 refills | Status: AC
  Filled 2024-02-08: qty 100, 30d supply, fill #0

## 2024-02-08 MED ORDER — LANCET DEVICE MISC
1.0000 | Freq: Three times a day (TID) | 0 refills | Status: AC
  Filled 2024-02-08: qty 1, 30d supply, fill #0

## 2024-02-08 MED ORDER — AMOXICILLIN-POT CLAVULANATE 875-125 MG PO TABS
1.0000 | ORAL_TABLET | Freq: Two times a day (BID) | ORAL | 0 refills | Status: DC
  Filled 2024-02-08: qty 120, 60d supply, fill #0

## 2024-02-08 MED ORDER — BLOOD GLUCOSE MONITOR SYSTEM W/DEVICE KIT
1.0000 | PACK | Freq: Three times a day (TID) | 0 refills | Status: AC
  Filled 2024-02-08: qty 1, 30d supply, fill #0

## 2024-02-08 MED ORDER — OXYCODONE HCL 5 MG PO TABS
5.0000 mg | ORAL_TABLET | Freq: Four times a day (QID) | ORAL | 0 refills | Status: AC | PRN
  Filled 2024-02-08: qty 20, 5d supply, fill #0

## 2024-02-08 MED ORDER — ACCU-CHEK SOFTCLIX LANCETS MISC
1.0000 | Freq: Three times a day (TID) | 0 refills | Status: AC
  Filled 2024-02-08: qty 100, 30d supply, fill #0

## 2024-02-08 MED ORDER — INSULIN ASPART 100 UNIT/ML FLEXPEN
10.0000 [IU] | PEN_INJECTOR | Freq: Three times a day (TID) | SUBCUTANEOUS | 1 refills | Status: AC
  Filled 2024-02-08: qty 9, 30d supply, fill #0

## 2024-02-08 MED ORDER — APIXABAN 5 MG PO TABS
5.0000 mg | ORAL_TABLET | Freq: Two times a day (BID) | ORAL | 0 refills | Status: AC
Start: 2024-02-08 — End: 2024-06-27
  Filled 2024-02-08: qty 60, 30d supply, fill #0

## 2024-02-08 MED ORDER — INSULIN GLARGINE 100 UNIT/ML SOLOSTAR PEN
30.0000 [IU] | PEN_INJECTOR | Freq: Every day | SUBCUTANEOUS | 1 refills | Status: AC
  Filled 2024-02-08: qty 9, 30d supply, fill #0

## 2024-02-08 MED ORDER — PANTOPRAZOLE SODIUM 40 MG PO TBEC
40.0000 mg | DELAYED_RELEASE_TABLET | Freq: Every day | ORAL | 0 refills | Status: DC
  Filled 2024-02-08: qty 30, 30d supply, fill #0

## 2024-02-08 NOTE — Progress Notes (Addendum)
 Pt has DC order. Pt was seen by diabetic coordinator and podiatry before DC. Podiatry changed the dressing of the LLE woundvac. AVS was given and explained to pt, all questions were answered. TOC will prepare pt's med. Father is providing transport.

## 2024-02-08 NOTE — Progress Notes (Signed)
 PODIATRY PROGRESS NOTE Patient Name: Dakota Becker  DOB [birth year] DOA 01/27/2024  Hospital Day: 74  Assessment:  48 y.o. 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 Partial first ray amputation of left foot 2. 2nd toe amputation at MPJ level of left foot 3. Application dissolvable antibiotic beads left foot  DOS: 01/29/24 1.  Repeat irrigation and debridement partial second metatarsal resection, left foot 2.  Dermal allograft application 5 x 4 cm, left foot 3.  Application negative pressure wound therapy 5 x 4 cm, left foot      AF WBC 10.3  Path: A. FOOT, PARTIAL LEFT FIRST RAY AND 2ND TOE, AMPUTATION:  - Benign bone with mild acute inflammation and bacterial colonies  - Margin shows acute inflammation and necrosis, and appears partially viable  - No malignancy identified   Cultures:   MODERATE ENTEROCOCCUS FAECALIS MODERATE STREPTOCOCCUS GROUP F FEW PREVOTELLA SPECIES    CT L foot: Soft tissue emphysema of 1st ray, intraosseous gas in phalanges of great toe, osteomyelitis great toe  Plan:  - S/p Repeat debridement with partial 2nd met resection, graft application partial closure of surgical site.  - Overall continuing to progress postop, maceration distal amp site due to vac drape - peeled it back some and applied betadine to area. Vac to be changed at home tomorrow.  - ABX per ID recommendations appreciate - Anticoagulation: On Eliquis  due to DVT, continue - Wound care: 2 x weekly vac changes at Home with [facility] Rn, try to dry area distal amp with betadine during changes or leave that area open to air and cover with gauze dressing.  - WB status: Heel WB in post op shoe for short distance transfers - Pt to follow up in 2 weeks in office, will have them arrange. Will sign off please msg with questions.  Maridee Shoemaker, DPM [facility]    Subjective:  Discussed overall continues to be improving, he says he has decreased pain in his calf and ankle at this time and swelling has gone down. He is aware of follow up info, office will call.   Objective:   Vitals:   02/08/24 0720 02/08/24 1106  BP: 124/78 126/87  Pulse: 95 (!) 108  Resp: 16 20  Temp: 98.5 F (36.9 C) 98.2 F (36.8 C)  SpO2: 98% 100%       Latest Ref Rng & Units 02/08/2024    3:38 AM 02/07/2024    9:42 AM 02/06/2024    3:19 AM  CBC  WBC 4.0 - 10.5 K/uL 10.3  12.9  10.6   Hemoglobin 13.0 - 17.0 g/dL 13.0  86.5  78.4   Hematocrit 39.0 - 52.0 % 37.3  39.3  36.1   Platelets 150 - 400 K/uL 549  647  519        Latest Ref Rng & Units 02/08/2024    3:38 AM 02/07/2024    9:42 AM 02/06/2024    3:19 AM  BMP  Glucose 70 - 99 mg/dL 696  295  284   BUN 6 - 20 mg/dL 8  10  9    Creatinine 0.61 - 1.24 mg/dL 1.32  4.40  1.02   Sodium 135 - 145 mmol/L 135  131  135   Potassium 3.5 - 5.1 mmol/L 4.2  4.1  4.0   Chloride  98 - 111 mmol/L 99  96  98   CO2 22 - 32 mmol/L 27  26  27    Calcium 8.9 - 10.3 mg/dL 9.2  9.5  9.1     General: AAOx3, NAD  Lower Extremity Exam S/p first and 2nd ray amp Left foot  Decreasing edema left foot/ankle  Wound vac intact and good seal  Distal closed part of amp site healing well though does have maceration due to vac drape   Pain with compression of left calf Able to DF/PF left ankle     Radiology:  Results reviewed. See assessment for pertinent imaging results

## 2024-02-08 NOTE — Telephone Encounter (Signed)
 Patient wife called and stated reported that patient is in the hospital and has a boil on the bottom of foot with an infection. Patient is on a round of antibiotics but wife thinks that it is not working and wound nurse is changing bandages when not suppose to. Patient wife wants to know if you can come and take a look at the wound.

## 2024-02-08 NOTE — Discharge Summary (Signed)
 Physician Discharge Summary  MELANIE HUME WGN:[id number] DOB: [birth year] DOA: 01/27/2024  PCP: Alejandro Hurt, FNP  Admit date: 01/27/2024 Discharge date: 02/08/2024  Time spent: 40 minutes  Recommendations for Outpatient Follow-up:  Follow outpatient CBC/CMP  Follow with podiatry as planned Continue heel weight bearing for short distances in post op shoe Follow with ID as scheduled Wound vac per podiatry Starting insulin  at discharge - follow with PCP for adjustments Continue anticoagulation x3 months, after this, needs discussion with PCP regarding whether he'd benefit from more long term anticoagulation based on risk factors  Discharge Diagnoses:  Principal Problem:   Toe osteomyelitis, left (HCC) Active Problems:   Gas gangrene of foot (HCC)   Gangrene of toe of left foot (HCC)   Controlled type 2 diabetes mellitus without complication, without long-term current use of insulin  (HCC)   Hypercalcemia   Constipation   Hypertension   Acute deep vein thrombosis (DVT) of left lower extremity (HCC)   Type 2 diabetes mellitus without complication, without long-term current use of insulin  (HCC)   Bacteremia   Nausea   Discharge Condition: stable  Diet recommendation: heart healthy, diabetic  Filed Weights   02/02/24 0532 02/05/24 0421 02/06/24 0503  Weight: 103.8 kg 101.7 kg 99.8 kg    History of present illness:   VIKRANT DELOERA is Etsuko Dierolf 48 y.o. male with medical history significant of diabetes, essential hypertension, hyperlipidemia, with great toe ulcer developed some blisters.  Not taking his diabetic medication.  He went to PCP and was put on oral antibiotics and referred to podiatry.  Recommended wound management but he never got contacted so started having increasing swelling in the left toe with pain for 5 days or so with some chills and nausea ,vomiting and foul odor.  Patient then presented to [facility].  Upon arrival to the ED podiatry was consulted.   Patient  met sepsis criteria secondary to osteomyelitis of the left great toe.  Patient was then taken to the OR and underwent left hallux amputation.  Patient underwent repeat debridement on 01/29/2024 by podiatry.  He was admitted for diabetic foot infection.  Required partial first ray amputation of L foot and 2nd toe amputation at MPJ level followed by repeat I&D.  Now with wound vac.  He had strep bacteremia.  ID consulted.  Now planning for extended course of augmentin .    See below for additional details.  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 CT with nec fasciitis of the great toe with findings of emphysematous osteomyelitis or osteonecrosis S/p partial first ray amputation of L foot, 2nd toe amputation at MPJ level of L foot, application of dissolvable abx beads left foot 5/3 5/5 had repeat I&D partial second metatarsal resection left foot, dermal allograft application 5x4 cm L foot, application negative pressure wound therapy 5x4 cm L foot TEE with EF 60-65%, no RWMA, no evidence of vegetation/infective endocarditis  Blood culture 5/3 with strep constellatus  Blood cultures 5/5 NGTD x5 Wound culture with moderate enterococcus faecalis (amp sensitive), moderate strep group F, few prevotella, beta lactamase positive Seen by ID 5/9, recommending augmentin  to complete 6 weeks from negative blood cultures.  Has ID follow up on 6/3.   Today, had nodules to left leg, concerning for abscesses (he notes this has gradually improved with size and pain).  CT showed 3.8x2.2x0.7 cm irregular curvilinear fluid collection of indeterminate  sterility.  Discussed with ortho, Dr. Christiane Cowing, recommending continuing abx and finish 6 week course of augmentin  - no plan for any drainage.   Transition to heel WB in post op shoe for short distance transfers, follow up in 2  weeks in office (last podiatry note 5/9)   Left Lower Extremity DVT Presume this was provoked in setting of infection above Needs at least 3 months anticoagulation Follow with PCP outpatient to discuss whether duration of anticoagulation should be extended   Nausea  Poor PO intake Normal gastric emptying study Improved, follow outpatient   Constipation Bowel regimen    Hypertension Lisinopril    Obesity Body mass index is 29.04 kg/m.   Hypercalcemia Resolved     Procedures: 5/5 Procedure:  1.  Repeat irrigation and debridement partial second metatarsal resection, left foot 2.  Dermal allograft application 5 x 4 cm, left foot 3.  Application negative pressure wound therapy 5 x 4 cm, left foot   5/3 Procedure:  1. Partial first ray amputation of left foot 2. 2nd toe amputation at MPJ level of left foot 3. Application dissolvable antibiotic beads left foot  TEE  IMPRESSIONS     1. Left ventricular ejection fraction, by estimation, is 60 to 65%. The  left ventricle has normal function. The left ventricle has no regional  wall motion abnormalities.   2. Right ventricular systolic function is normal. The right ventricular  size is normal.   3. No left atrial/left atrial appendage thrombus was detected.   4. The mitral valve is normal in structure. Trivial mitral valve  regurgitation. No evidence of mitral stenosis.   5. The aortic valve is tricuspid. Aortic valve regurgitation is not  visualized. No aortic stenosis is present.   Conclusion(s)/Recommendation(s): Normal biventricular function without  evidence of hemodynamically significant valvular heart disease. No  evidence of vegetation/infective endocarditis on this transesophageael  echocardiogram.   Echo IMPRESSIONS     1. Left ventricular ejection fraction, by estimation, is 60 to 65%. The  left ventricle has normal function. The left ventricle has no regional  wall motion abnormalities. Left  ventricular diastolic parameters are  consistent with Grade I diastolic  dysfunction (impaired relaxation).   2. Right ventricular systolic function is normal. The right ventricular  size is normal.   3. The mitral valve is normal in structure. No evidence of mitral valve  regurgitation. No evidence of mitral stenosis.   4. The aortic valve is normal in structure. Aortic valve regurgitation is  not visualized. No aortic stenosis is present.   5. The inferior vena cava is normal in size with greater than 50%  respiratory variability, suggesting right atrial pressure of 3 mmHg.   Conclusion(s)/Recommendation(s): No evidence of valvular vegetations on  this transthoracic echocardiogram. Consider Jaleya Pebley transesophageal  echocardiogram to exclude infective endocarditis if clinically indicated.   LE arterial US   Summary:  Left: No evidence of arterial stenosis.   LE US   Summary:  RIGHT:  - No evidence of common femoral vein obstruction.         LEFT:  - Findings consistent with acute deep vein thrombosis involving the left  peroneal veins.    Consultations: Cardiology ID Orthopedics podiatry  Discharge Exam: Vitals:   02/08/24 0340 02/08/24 0720  BP: 122/84 124/78  Pulse: (!) 101 95  Resp: 16 16  Temp: 99.3 F (37.4 C) 98.5 F (36.9 C)  SpO2: 95% 98%   No new complaints Mother, father at bedside  General: No acute distress. Cardiovascular:  RRR Lungs: unlabored Neurological: Alert and oriented 3. Moves all extremities 4. Cranial nerves II through XII grossly intact. Extremities: wound vac to LLE, 3 areas of fluctuance (1-2 cm in diameter) - improving from yesterday  Discharge Instructions   Discharge Instructions     Call MD for:  difficulty breathing, headache or visual disturbances   Complete by: As directed    Call MD for:  extreme fatigue   Complete by: As directed    Call MD for:  hives   Complete by: As directed    Call MD for:  persistant dizziness  or light-headedness   Complete by: As directed    Call MD for:  persistant nausea and vomiting   Complete by: As directed    Call MD for:  redness, tenderness, or signs of infection (pain, swelling, redness, odor or green/yellow discharge around incision site)   Complete by: As directed    Call MD for:  severe uncontrolled pain   Complete by: As directed    Call MD for:  temperature >100.4   Complete by: As directed    Diet - low sodium heart healthy   Complete by: As directed    Discharge instructions   Complete by: As directed    You were seen for Gethsemane Fischler diabetic foot infection.  You had an infection of the bone requiring amputation.    You've had IV antibiotics and now we're transitioning you to oral antibiotics.  You'll be on oral antibiotics for 6 weeks.  You're high risk for needing additional procedures/interventions/amputations.  It's important that you continue wound care as recommended by podiatry.  Weight bear to the heel in the post op shoe only for short distances.  Good blood sugar control is important for wound healing.  Follow up with podiatry as recommended.  If you have worsening pain, swelling, fevers, etc. Return to the hospital for additional care/evaluation.  I'm starting you on insulin  for better blood sugar control.  Watch your blood sugars at home and keep Arisha Gervais log.  Bring this to your PCP so they can adjust your insulin  additionally going forward.  You had Lovene Maret DVT (blood clot) in your left leg.  I think this was "provoked" in the setting of your infection.  You'll need at least 3 months of anticoagulation.  After this, you can discuss with your PCP whether you need more long term anticoagulation or if this can be discontinued.    Return for new, recurrent, or worsening symptoms.  Please ask your PCP to request records from this hospitalization so they know what was done and what the next steps will be.   Discharge wound care:   Complete by: As directed    NPWT foot wound;  change 2x wk Q Tues/Friday Integra graft in place; stapled, do not disrupt, cover with Mepitel and black foam  Per podiatry   Increase activity slowly   Complete by: As directed       Allergies as of 02/08/2024   No Known Allergies      Medication List     STOP taking these medications    glipiZIDE 10 MG 24 hr tablet Commonly known as: GLUCOTROL XL   Jardiance 25 MG Tabs tablet Generic drug: empagliflozin   Rybelsus 3 MG Tabs Generic drug: Semaglutide       TAKE these medications    amoxicillin -clavulanate 875-125 MG tablet Commonly known as: AUGMENTIN  Take 1 tablet by mouth every 12 (twelve) hours.   apixaban  5 MG Tabs  tablet Commonly known as: ELIQUIS  Take 1 tablet (5 mg total) by mouth 2 (two) times daily.   Blood Glucose Monitoring Suppl Devi 1 each by Does not apply route in the morning, at noon, and at bedtime. May substitute to any manufacturer covered by patient's insurance.   BLOOD GLUCOSE TEST STRIPS Strp 1 each by In Vitro route in the morning, at noon, and at bedtime. May substitute to any manufacturer covered by patient's insurance.   gabapentin  300 MG capsule Commonly known as: NEURONTIN  Take 300 mg by mouth 3 (three) times daily.   insulin  aspart 100 UNIT/ML FlexPen Commonly known as: NOVOLOG  Inject 10 Units into the skin 3 (three) times daily with meals.   insulin  glargine 100 UNIT/ML Solostar Pen Commonly known as: LANTUS  Inject 30 Units into the skin daily.   Lancet Device Misc 1 each by Does not apply route in the morning, at noon, and at bedtime. May substitute to any manufacturer covered by patient's insurance.   Lancets Misc. Misc 1 each by Does not apply route in the morning, at noon, and at bedtime. May substitute to any manufacturer covered by patient's insurance.   lisinopril  20 MG tablet Commonly known as: ZESTRIL  Take 20 mg by mouth daily.   oxyCODONE  5 MG immediate release tablet Commonly known as: Roxicodone  Take 1  tablet (5 mg total) by mouth every 6 (six) hours as needed for up to 5 days.   pantoprazole  40 MG tablet Commonly known as: PROTONIX  Take 1 tablet (40 mg total) by mouth daily at 6 (six) AM. Start taking on: Feb 09, 2024               Durable Medical Equipment  (From admission, onward)           Start     Ordered   02/07/24 1202  For home use only DME Bedside commode  Once       Question:  Patient needs Revis Whalin bedside commode to treat with the following condition  Answer:  Weakness   02/07/24 1201   02/07/24 1201  For home use only DME 4 wheeled rolling walker with seat  Once       Question:  Patient needs Braulio Kiedrowski walker to treat with the following condition  Answer:  Weakness   02/07/24 1201              Discharge Care Instructions  (From admission, onward)           Start     Ordered   02/08/24 0000  Discharge wound care:       Comments: NPWT foot wound; change 2x wk Q Tues/Friday Integra graft in place; stapled, do not disrupt, cover with Mepitel and black foam  Per podiatry   02/08/24 1041           No Known Allergies  Contact information for follow-up providers     Alejandro Hurt, FNP Follow up.   Specialty: Family Medicine Why: call your PCP to schedule Shaunna Rosetti hospital follow up appointment Contact information: [address] [place] [address] [phone]         [employer] Follow up.   Why: rollator, 3 n 1 Contact information: [phone]        [facility] Follow up.   Why: Important Resources and Contacts  General Questions: [phone] Order Supplies: [phone], option 2, then dial ext. [phone] 24/7 Technical Support: [phone], option 3. Help with V.Mercer Peifer.C. Therapy unit  such as changing  full canister, charging the unit, therapy alarms, troubleshooting dressing applications or questions  regarding safety. Reach out to your health care provider managing your V.Ringo Sherod.C. Therapy for any  questions directly  related to your therapy Contact information: If unit alarms or if experiencing issues to contact the home health, wound  care clinic, or physician's office that is managing the V.Shakiyla Kook.C. Therapy.  If unsuccessful, call [facility] Technical Support at [phone], option 3        [name], MD Follow up on 02/27/2024.   Specialty: Infectious Diseases Why: you have an appointment on June 3rd at 9 am for infectious disease follow up Contact information: [address], [address] [place] [address] [phone]         Evertt Hoe, DPM Follow up.   Specialty: Podiatry Why: follow up as scheduled Contact information: [address] [place] [address] [phone]              Contact information for after-discharge care     Destination     [facility] [place] [place] Preferred SNF .   Service: Skilled Nursing Contact information: [address] [place]  [phone]                      The results of significant diagnostics from this hospitalization (including imaging, microbiology, ancillary and laboratory) are listed below for reference.    Significant Diagnostic Studies: CT TIBIA FIBULA LEFT W CONTRAST Result Date: 02/07/2024 CLINICAL DATA:  concern for soft tissue infection. Status post partial first and second ray amputation the left foot. EXAM: CT OF THE LOWER LEFT EXTREMITY WITH CONTRAST TECHNIQUE: Multidetector CT imaging of the lower left extremity was performed according to the standard protocol following intravenous contrast administration. RADIATION DOSE REDUCTION: This exam was performed according to the departmental dose-optimization program which includes automated exposure control, adjustment of the mA and/or kV according to patient size and/or use of iterative reconstruction technique. CONTRAST:  75mL OMNIPAQUE IOHEXOL 350 MG/ML SOLN COMPARISON:  Left foot radiographs  dated 01/29/2024. FINDINGS: Bones/Joint/Cartilage Status post amputation of the first ray to the level of the proximal metatarsal shaft. Transsection margin appears intact. Known secondary ray transsection margin not included within the field of view. No evidence of acute osteolysis or erosive changes. No acute fracture or dislocation. Ligaments Ligaments are suboptimally evaluated by CT. Soft tissue / Muscles and Tendons Postsurgical changes with soft tissue swelling, soft tissue air and cutaneous staples at the distal medial foot amputation stump. Diffuse circumferential subcutaneous edema extending from the left proximal to mid calf/shin distally through the foot. Laronda Lisby few regions of focal cutaneous prominence along the medial mid to distal shin are nonspecific and could reflect blisters. There is Jermarion Poffenberger 3.8 x 2.2 x 0.7 cm irregular curvilinear fluid collection of indeterminate sterility within the medial subcutaneous soft tissues of the ankle, overlying the superficial margin of the posterior tibialis tendon (series 4, image 334 and series 9, image 50). Abscess cannot be excluded. IMPRESSION: 1. Diffuse nonspecific circumferential subcutaneous edema extending from the visualized left foot to the proximal calf/shin, may reflect cellulitis in the appropriate setting. There is Rosalina Dingwall 3.8 x 2.2 x 0.7 cm irregular curvilinear fluid collection of indeterminate sterility within the medial subcutaneous soft tissues of the ankle, overlying the superficial margin of the posterior tibialis tendon. Abscess cannot be excluded. 2. Postsurgical changes related to recent partial amputations of the first and second  rays of the left foot. The proximal first metatarsal transsection margin appears intact. Known second ray transsection margin is not included within the field of view. No acute osseous abnormality identified. Electronically Signed   By: Mannie Seek M.D.   On: 02/07/2024 16:06   NM GASTRIC EMPTYING Result Date:  02/06/2024 CLINICAL DATA:  Concern for gastroparesis. EXAM: NUCLEAR MEDICINE GASTRIC EMPTYING SCAN TECHNIQUE: After oral ingestion of radiolabeled meal, sequential abdominal images were obtained for 120 minutes. Residual percentage of activity remaining within the stomach was calculated at 60 and 120 minutes. RADIOPHARMACEUTICALS:  2.2 mCi Tc-46m sulfur colloid in standardized meal COMPARISON:  No recent relevant priors available for comparison. FINDINGS: Expected location of the stomach in the left upper quadrant. Ingested meal empties the stomach gradually over the course of the study with 14% retention at 60 min and 5% retention at 120 min (normal retention less than 30% at Iyah Laguna 120 min). IMPRESSION: Normal gastric emptying study. Electronically Signed   By: Tama Fails M.D.   On: 02/06/2024 14:44   EP STUDY Result Date: 02/03/2024 See surgical note for result.  ECHO TEE Result Date: 02/02/2024    TRANSESOPHOGEAL ECHO REPORT   Patient Name:   MCKAY FITZNER [facility] Date of Exam: 02/02/2024 Medical Rec #:  [id number]       Height:       73.0 in Accession #:    [id number]      Weight:       228.8 lb Date of Birth:  1976/03/16       BSA:          2.278 m Patient Age:    47 years        BP:           156/85 mmHg Patient Gender: M               HR:           93 bpm. Exam Location:  Inpatient Procedure: Transesophageal Echo, Color Doppler and Cardiac Doppler (Both            Spectral and Color Flow Doppler were utilized during procedure). Indications:     endocarditis  History:         Patient has prior history of Echocardiogram examinations, most                  recent 01/31/2024. Signs/Symptoms:Bacteremia; Risk                  Factors:Diabetes and Hypertension.  Sonographer:     Dione Franks RDCS Referring Phys:  [id number] Darline Eis HALEY Diagnosing Phys: Maudine Sos MD PROCEDURE: After discussion of the risks and benefits of Jacqueline Spofford TEE, an informed consent was obtained from the patient. The transesophogeal probe was  passed without difficulty through the esophogus of the patient. Imaged were obtained with the patient in Stacia Feazell left lateral decubitus position. Local oropharyngeal anesthetic was provided with viscous lidocaine . Sedation performed by different physician. The patient was monitored while under deep sedation. Anesthestetic sedation was provided intravenously by Anesthesiology: 173mg  of Propofol , 20mg  of Lidocaine . The patient's vital signs; including heart rate, blood pressure, and oxygen saturation; remained stable throughout the procedure. The patient developed no complications during the procedure.  IMPRESSIONS  1. Left ventricular ejection fraction, by estimation, is 60 to 65%. The left ventricle has normal function. The left ventricle has no regional wall motion abnormalities.  2. Right ventricular systolic function is normal. The right ventricular size is  normal.  3. No left atrial/left atrial appendage thrombus was detected.  4. The mitral valve is normal in structure. Trivial mitral valve regurgitation. No evidence of mitral stenosis.  5. The aortic valve is tricuspid. Aortic valve regurgitation is not visualized. No aortic stenosis is present. Conclusion(s)/Recommendation(s): Normal biventricular function without evidence of hemodynamically significant valvular heart disease. No evidence of vegetation/infective endocarditis on this transesophageael echocardiogram. FINDINGS  Left Ventricle: Left ventricular ejection fraction, by estimation, is 60 to 65%. The left ventricle has normal function. The left ventricle has no regional wall motion abnormalities. The left ventricular internal cavity size was normal in size. There is  no left ventricular hypertrophy. Right Ventricle: The right ventricular size is normal. No increase in right ventricular wall thickness. Right ventricular systolic function is normal. Left Atrium: Left atrial size was normal in size. No left atrial/left atrial appendage thrombus was detected.  Right Atrium: Right atrial size was normal in size. Pericardium: There is no evidence of pericardial effusion. Mitral Valve: The mitral valve is normal in structure. Trivial mitral valve regurgitation. No evidence of mitral valve stenosis. Tricuspid Valve: The tricuspid valve is normal in structure. Tricuspid valve regurgitation is not demonstrated. No evidence of tricuspid stenosis. Aortic Valve: The aortic valve is tricuspid. Aortic valve regurgitation is not visualized. No aortic stenosis is present. Pulmonic Valve: The pulmonic valve was normal in structure. Pulmonic valve regurgitation is not visualized. No evidence of pulmonic stenosis. Aorta: The aortic root is normal in size and structure. IAS/Shunts: No atrial level shunt detected by color flow Doppler. Maudine Sos MD Electronically signed by Maudine Sos MD Signature Date/Time: 02/02/2024/2:04:27 PM    Final    ECHOCARDIOGRAM COMPLETE Result Date: 01/31/2024    ECHOCARDIOGRAM REPORT   Patient Name:   Casmir Linnell [facility] Date of Exam: 01/31/2024 Medical Rec #:  [id number]       Height:       73.0 in Accession #:    [id number]      Weight:       229.7 lb Date of Birth:  July 16, 1976       BSA:          2.282 m Patient Age:    47 years        BP:           139/83 mmHg Patient Gender: M               HR:           105 bpm. Exam Location:  Inpatient Procedure: 2D Echo (Both Spectral and Color Flow Doppler were utilized during            procedure). Indications:    Bacteremia  History:        Patient has no prior history of Echocardiogram examinations.                 Risk Factors:Diabetes.  Sonographer:    Dione Franks RDCS Referring Phys: [id number] [facility] IMPRESSIONS  1. Left ventricular ejection fraction, by estimation, is 60 to 65%. The left ventricle has normal function. The left ventricle has no regional wall motion abnormalities. Left ventricular diastolic parameters are consistent with Grade I diastolic dysfunction (impaired relaxation).  2.  Right ventricular systolic function is normal. The right ventricular size is normal.  3. The mitral valve is normal in structure. No evidence of mitral valve regurgitation. No evidence of mitral stenosis.  4. The aortic valve is normal in structure. Aortic valve regurgitation is  not visualized. No aortic stenosis is present.  5. The inferior vena cava is normal in size with greater than 50% respiratory variability, suggesting right atrial pressure of 3 mmHg. Conclusion(s)/Recommendation(s): No evidence of valvular vegetations on this transthoracic echocardiogram. Consider Alizaya Oshea transesophageal echocardiogram to exclude infective endocarditis if clinically indicated. FINDINGS  Left Ventricle: Left ventricular ejection fraction, by estimation, is 60 to 65%. The left ventricle has normal function. The left ventricle has no regional wall motion abnormalities. The left ventricular internal cavity size was normal in size. There is  borderline left ventricular hypertrophy. Left ventricular diastolic parameters are consistent with Grade I diastolic dysfunction (impaired relaxation). Right Ventricle: The right ventricular size is normal. No increase in right ventricular wall thickness. Right ventricular systolic function is normal. Left Atrium: Left atrial size was normal in size. Right Atrium: Right atrial size was normal in size. Pericardium: There is no evidence of pericardial effusion. Mitral Valve: The mitral valve is normal in structure. No evidence of mitral valve regurgitation. No evidence of mitral valve stenosis. Tricuspid Valve: The tricuspid valve is normal in structure. Tricuspid valve regurgitation is not demonstrated. No evidence of tricuspid stenosis. Aortic Valve: The aortic valve is normal in structure. Aortic valve regurgitation is not visualized. No aortic stenosis is present. Pulmonic Valve: The pulmonic valve was normal in structure. Pulmonic valve regurgitation is not visualized. No evidence of pulmonic  stenosis. Aorta: The aortic root is normal in size and structure. Venous: The inferior vena cava is normal in size with greater than 50% respiratory variability, suggesting right atrial pressure of 3 mmHg. IAS/Shunts: No atrial level shunt detected by color flow Doppler.  LEFT VENTRICLE PLAX 2D LVIDd:         4.80 cm   Diastology LVIDs:         2.80 cm   LV e' medial:    8.92 cm/s LV PW:         1.10 cm   LV E/e' medial:  10.0 LV IVS:        1.10 cm   LV e' lateral:   16.10 cm/s LVOT diam:     2.30 cm   LV E/e' lateral: 5.6 LV SV:         67 LV SV Index:   29 LVOT Area:     4.15 cm  RIGHT VENTRICLE             IVC RV Basal diam:  2.80 cm     IVC diam: 2.00 cm RV S prime:     20.80 cm/s TAPSE (M-mode): 2.4 cm LEFT ATRIUM           Index        RIGHT ATRIUM           Index LA diam:      3.90 cm 1.71 cm/m   RA Area:     12.80 cm LA Vol (A2C): 38.1 ml 16.69 ml/m  RA Volume:   29.40 ml  12.88 ml/m  AORTIC VALVE LVOT Vmax:   105.00 cm/s LVOT Vmean:  70.500 cm/s LVOT VTI:    0.162 m  AORTA Ao Root diam: 3.40 cm Ao Asc diam:  3.10 cm MITRAL VALVE MV Area (PHT): 3.77 cm    SHUNTS MV Decel Time: 201 msec    Systemic VTI:  0.16 m MV E velocity: 89.60 cm/s  Systemic Diam: 2.30 cm MV Elizar Alpern velocity: 95.60 cm/s MV E/Ziyan Schoon ratio:  0.94 Dorothye Gathers MD Electronically signed by Dorothye Gathers MD  Signature Date/Time: 01/31/2024/5:17:07 PM    Final    VAS US  LOWER EXTREMITY VENOUS (DVT) Result Date: 01/30/2024  Lower Venous DVT Study Patient Name:  Jolon Linnell Richardson [facility]  Date of Exam:   01/30/2024 Medical Rec #: [id number]        Accession #:    [id number] Date of Birth: June 23, 1976        Patient Gender: M Patient Age:   15 years Exam Location:  [facility] Procedure:      VAS US  LOWER EXTREMITY VENOUS (DVT) Referring Phys: Russ Course --------------------------------------------------------------------------------  Indications: Pain, and Swelling.  Limitations: Poor ultrasound/tissue interface. Comparison Study: Previous exam on  01/05/2024 was negative for DVT. Performing Technologist: Arlyce Berger RVT, RDMS  Examination Guidelines: Chey Cho complete evaluation includes B-mode imaging, spectral Doppler, color Doppler, and power Doppler as needed of all accessible portions of each vessel. Bilateral testing is considered an integral part of Daaron Dimarco complete examination. Limited examinations for reoccurring indications may be performed as noted. The reflux portion of the exam is performed with the patient in reverse Trendelenburg.  +-----+---------------+---------+-----------+----------+--------------+ RIGHTCompressibilityPhasicitySpontaneityPropertiesThrombus Aging +-----+---------------+---------+-----------+----------+--------------+ CFV  Full           Yes      Yes                                 +-----+---------------+---------+-----------+----------+--------------+   +---------+---------------+---------+-----------+----------+--------------+ LEFT     CompressibilityPhasicitySpontaneityPropertiesThrombus Aging +---------+---------------+---------+-----------+----------+--------------+ CFV      Full           Yes      Yes                                 +---------+---------------+---------+-----------+----------+--------------+ SFJ      Full                                                        +---------+---------------+---------+-----------+----------+--------------+ FV Prox  Full           Yes      Yes                                 +---------+---------------+---------+-----------+----------+--------------+ FV Mid   Full           Yes      Yes                                 +---------+---------------+---------+-----------+----------+--------------+ FV DistalFull           Yes      Yes                                 +---------+---------------+---------+-----------+----------+--------------+ PFV      Full                                                         +---------+---------------+---------+-----------+----------+--------------+ POP  Full           Yes      Yes                                 +---------+---------------+---------+-----------+----------+--------------+ PTV      Full                                                        +---------+---------------+---------+-----------+----------+--------------+ PERO     None           No       No                   Acute          +---------+---------------+---------+-----------+----------+--------------+     Summary: RIGHT: - No evidence of common femoral vein obstruction.   LEFT: - Findings consistent with acute deep vein thrombosis involving the left peroneal veins.  - Ultrasound characteristics of enlarged lymph nodes noted in the groin. Subcutaneous edema in area of calf and ankle.  *See table(s) above for measurements and observations. Electronically signed by Delaney Fearing on 01/30/2024 at 2:53:10 PM.    Final    VAS US  LOWER EXTREMITY ARTERIAL DUPLEX Result Date: 01/30/2024 LOWER EXTREMITY ARTERIAL DUPLEX STUDY Patient Name:  JAKEVIOUS LICAUSI [facility]  Date of Exam:   01/30/2024 Medical Rec #: [id number]        Accession #:    [id number] Date of Birth: 05-Aug-1976        Patient Gender: M Patient Age:   45 years Exam Location:  [facility] Procedure:      VAS US  LOWER EXTREMITY ARTERIAL DUPLEX Referring Phys: Russ Course --------------------------------------------------------------------------------  Indications: Gangrene. High Risk Factors: Hypertension, hyperlipidemia, Diabetes, no history of                    smoking.  Current ABI: n/Makyi Ledo Comparison Study: No previous exams Performing Technologist: Jody Hill RVT, RDMS  Examination Guidelines: Archita Lomeli complete evaluation includes B-mode imaging, spectral Doppler, color Doppler, and power Doppler as needed of all accessible portions of each vessel. Bilateral testing is considered an integral part of Jessen Siegman complete examination. Limited  examinations for reoccurring indications may be performed as noted.  +---------+--------+-----+--------+--------+--------+ RIGHT    PSV cm/sRatioStenosisWaveformComments +---------+--------+-----+--------+--------+--------+ PERO Prox86                                    +---------+--------+-----+--------+--------+--------+ PERO Mid 49                                    +---------+--------+-----+--------+--------+--------+  +-----------+--------+-----+--------+---------+--------+ LEFT       PSV cm/sRatioStenosisWaveform Comments +-----------+--------+-----+--------+---------+--------+ CFA Mid    92                   biphasic          +-----------+--------+-----+--------+---------+--------+ DFA        60                   biphasic          +-----------+--------+-----+--------+---------+--------+ SFA Prox   94  biphasic          +-----------+--------+-----+--------+---------+--------+ SFA Mid    125                  triphasic         +-----------+--------+-----+--------+---------+--------+ SFA Distal 81                   triphasic         +-----------+--------+-----+--------+---------+--------+ POP Prox   74                   triphasic         +-----------+--------+-----+--------+---------+--------+ POP Distal 66                   triphasic         +-----------+--------+-----+--------+---------+--------+ TP Trunk   84                   triphasic         +-----------+--------+-----+--------+---------+--------+ ATA Prox   76                   triphasic         +-----------+--------+-----+--------+---------+--------+ ATA Mid    111                  triphasic         +-----------+--------+-----+--------+---------+--------+ ATA Distal 121                  triphasic         +-----------+--------+-----+--------+---------+--------+ PTA Prox   119                  triphasic          +-----------+--------+-----+--------+---------+--------+ PTA Mid    118                  triphasic         +-----------+--------+-----+--------+---------+--------+ PTA Distal 89                   triphasic         +-----------+--------+-----+--------+---------+--------+ PERO Prox  92                   triphasic         +-----------+--------+-----+--------+---------+--------+ PERO Mid   49                   biphasic          +-----------+--------+-----+--------+---------+--------+ PERO Distal52                   triphasic         +-----------+--------+-----+--------+---------+--------+ Unable to image DPA due to wound vac/bandage placement.  Summary: Left: No evidence of arterial stenosis.  See table(s) above for measurements and observations. Electronically signed by Delaney Fearing on 01/30/2024 at 2:52:57 PM.    Final    DG Foot 2 Views Left Result Date: 01/29/2024 CLINICAL DATA:  Osteomyelitis left foot.  Postoperative evaluation. EXAM: LEFT FOOT - 2 VIEW COMPARISON:  Left foot radiographs 01/27/2024, CT left foot 01/27/2024 FINDINGS: Recent amputation of the first ray to the metatarsal midshaft and of the second toe phalanges seen on 01/27/2024 radiographs. On the current radiographs there is repeat amputation of the first ray now to the proximal metatarsal shaft and repeat amputation of the second ray now to the proximal to mid metatarsal shaft. Expected postoperative changes of distal medial foot subcutaneous air and  surgical skin staples. Interval removal of the prior antibiotic beads except for Hashem Goynes likely single antibiotic bead in the location of the now resected distal aspect of the second metatarsal. Small plantar calcaneal heel spur. Mild dorsal talonavicular degenerative spurring. No acute fracture dislocation. IMPRESSION: Repeat amputation of the first ray now to the proximal metatarsal shaft and repeat amputation of the second ray now to the proximal to mid metatarsal  shaft. The amputation margins are sharp. Electronically Signed   By: Bertina Broccoli M.D.   On: 01/29/2024 14:42   DG Foot 2 Views Left Result Date: 01/27/2024 CLINICAL DATA:  Postoperative. EXAM: LEFT FOOT - 2 VIEW COMPARISON:  Left foot x-ray 01/27/2024 FINDINGS: There is been interval amputation of the distal first metatarsal and first phalanx as well as the second phalanx. There is no acute fracture or dislocation. There are antibiotic seeds and soft tissue swelling over the surgical site. IMPRESSION: Interval amputation of the distal first metatarsal and first phalanx as well as the second phalanx. Electronically Signed   By: Tyron Gallon M.D.   On: 01/27/2024 19:08   CT Foot Left Wo Contrast Result Date: 01/27/2024 CLINICAL DATA:  Osteonecrosis suspected. Gangrene. Necrotizing fasciitis. EXAM: CT OF THE LEFT FOOT AND ANKLE WITHOUT CONTRAST TECHNIQUE: Multidetector CT imaging of the left foot was performed according to the standard protocol. Multiplanar CT image reconstructions were also generated. RADIATION DOSE REDUCTION: This exam was performed according to the departmental dose-optimization program which includes automated exposure control, adjustment of the mA and/or kV according to patient size and/or use of iterative reconstruction technique. COMPARISON:  None Available. FINDINGS: Bones/Joint/Cartilage There is intraosseous gas in the phalanges of the great toe consistent with emphysematous osteomyelitis or osteonecrosis. There is slight fragmentation of the tuft of the distal phalanx of the great toe. No other acute fracture. There is no dislocation. The bones are well mineralized. No arthritic changes. Ligaments Suboptimally assessed by CT. Muscles and Tendons No intramuscular fluid collection. Soft tissues There is diffuse subcutaneous edema. Extensive soft tissue gas in the great toe and along the dorsal of the first metatarsal consistent with necrotizing fasciitis. There is irregularity of the  skin of the distal great toe likely representing ulceration. IMPRESSION: Necrotizing fasciitis of the great toe with findings of emphysematous osteomyelitis or osteonecrosis. MRI may provide better evaluation if clinically indicated. Electronically Signed   By: Angus Bark M.D.   On: 01/27/2024 16:24   CT Ankle Left Wo Contrast Result Date: 01/27/2024 CLINICAL DATA:  Osteonecrosis suspected. Gangrene. Necrotizing fasciitis. EXAM: CT OF THE LEFT FOOT AND ANKLE WITHOUT CONTRAST TECHNIQUE: Multidetector CT imaging of the left foot was performed according to the standard protocol. Multiplanar CT image reconstructions were also generated. RADIATION DOSE REDUCTION: This exam was performed according to the departmental dose-optimization program which includes automated exposure control, adjustment of the mA and/or kV according to patient size and/or use of iterative reconstruction technique. COMPARISON:  None Available. FINDINGS: Bones/Joint/Cartilage There is intraosseous gas in the phalanges of the great toe consistent with emphysematous osteomyelitis or osteonecrosis. There is slight fragmentation of the tuft of the distal phalanx of the great toe. No other acute fracture. There is no dislocation. The bones are well mineralized. No arthritic changes. Ligaments Suboptimally assessed by CT. Muscles and Tendons No intramuscular fluid collection. Soft tissues There is diffuse subcutaneous edema. Extensive soft tissue gas in the great toe and along the dorsal of the first metatarsal consistent with necrotizing fasciitis. There is irregularity of the skin of  the distal great toe likely representing ulceration. IMPRESSION: Necrotizing fasciitis of the great toe with findings of emphysematous osteomyelitis or osteonecrosis. MRI may provide better evaluation if clinically indicated. Electronically Signed   By: Angus Bark M.D.   On: 01/27/2024 16:24   DG Foot Complete Left Result Date: 01/27/2024 CLINICAL DATA:   Gangrene of left and right great toes with toenail removal in April. EXAM: LEFT FOOT - COMPLETE 3+ VIEW COMPARISON:  04/10/2012. FINDINGS: There are destructive bony changes at the tuft and base of the distal phalanx of the first digit with fragmentation at the tuft. No dislocation is seen. There soft tissue swelling at the first digit with multiple skin defects and subcutaneous air at the first digit and extending over the dorsum of the first and second metatarsal. IMPRESSION: 1. Destructive changes involving the tuft and base of the distal phalanx of the first digit, suspicious for osteomyelitis. 2. Extensive subcutaneous emphysema involving the first digit extending over the dorsum of the first and second metatarsals. Electronically Signed   By: Wyvonnia Heimlich M.D.   On: 01/27/2024 15:01    Microbiology: Recent Results (from the past 240 hours)  Culture, blood (Routine X 2) w Reflex to ID Panel     Status: None   Collection Time: 01/29/24 10:55 AM   Specimen: BLOOD LEFT ARM  Result Value Ref Range Status   Specimen Description BLOOD LEFT ARM  Final   Special Requests   Final    BOTTLES DRAWN AEROBIC AND ANAEROBIC Blood Culture results may not be optimal due to an inadequate volume of blood received in culture bottles   Culture   Final    NO GROWTH 5 DAYS Performed at [facility], [address], [place], [place] [address]    Report Status 02/03/2024 FINAL  Final  Culture, blood (Routine X 2) w Reflex to ID Panel     Status: None   Collection Time: 01/29/24 10:55 AM   Specimen: BLOOD LEFT ARM  Result Value Ref Range Status   Specimen Description BLOOD LEFT ARM  Final   Special Requests   Final    BOTTLES DRAWN AEROBIC AND ANAEROBIC Blood Culture results may not be optimal due to an inadequate volume of blood received in culture bottles   Culture   Final    NO GROWTH 5 DAYS Performed at [facility], [address], [place], [place] [address]    Report Status 02/03/2024 FINAL   Final     Labs: Basic Metabolic Panel: Recent Labs  Lab 02/02/24 0244 02/03/24 0314 02/04/24 0243 02/05/24 0257 02/06/24 0319 02/07/24 0942 02/08/24 0338  NA 134* 134* 132* 134* 135 131* 135  K 3.8 4.0 4.0 3.8 4.0 4.1 4.2  CL 97* 98 94* 93* 98 96* 99  CO2 26 27 27 29 27 26 27   GLUCOSE 255* 296* 288* 255* 229* 309* 174*  BUN 9 6 5* 8 9 10 8   CREATININE 0.70 0.74 0.68 0.87 0.77 0.93 0.73  CALCIUM 8.8* 8.9 9.3 9.4 9.1 9.5 9.2  MG 1.9 1.9  --   --   --   --  2.0  PHOS  --   --   --   --   --   --  3.8   Liver Function Tests: Recent Labs  Lab 02/07/24 0942 02/08/24 0338  AST 16 12*  ALT 12 16  ALKPHOS 61 60  BILITOT 0.6 0.2  PROT 8.2* 7.4  ALBUMIN 2.5* 2.3*  No results for input(s): "LIPASE", "AMYLASE" in the last 168 hours. No results for input(s): "AMMONIA" in the last 168 hours. CBC: Recent Labs  Lab 02/02/24 0244 02/03/24 0314 02/04/24 0243 02/05/24 0257 02/06/24 0319 02/07/24 0942 02/08/24 0338  WBC 12.0*   < > 12.7* 11.9* 10.6* 12.9* 10.3  NEUTROABS 9.2*  --   --   --   --   --  7.5  HGB 11.4*   < > 12.4* 12.3* 11.7* 12.7* 12.2*  HCT 35.3*   < > 38.3* 38.2* 36.1* 39.3 37.3*  MCV 88.9   < > 89.5 90.1 89.6 89.3 87.6  PLT 447*   < > 551* 538* 519* 647* 549*   < > = values in this interval not displayed.   Cardiac Enzymes: No results for input(s): "CKTOTAL", "CKMB", "CKMBINDEX", "TROPONINI" in the last 168 hours. BNP: BNP (last 3 results) No results for input(s): "BNP" in the last 8760 hours.  ProBNP (last 3 results) No results for input(s): "PROBNP" in the last 8760 hours.  CBG: Recent Labs  Lab 02/07/24 0621 02/07/24 1030 02/07/24 1619 02/07/24 2117 02/08/24 0621  GLUCAP 263* 273* 181* 158* 179*       Signed:  Donnetta Gains MD.  [facility] 02/08/2024, 10:53 AM

## 2024-02-08 NOTE — TOC Transition Note (Signed)
 Transition of Care [facility]) - Discharge Note   Patient Details  Name: Dakota Becker MRN: [id number] Date of Birth: 01/09/1976  Transition [facility] CM/SW Contact:  Jennett Model, RN Phone Number: 02/08/2024, 10:59 AM   Clinical Narrative:    For dc today, his father and mother will transport him home today.  NCM notified Kelly with [facility]. Informed patient that per pharmacy his 30 day supply of eliquis  will be free today with coupon thru [facility] pharmacy,  NCM gave him 10.00 copay coupon for refills.  Per pharmacy the refills will be 75.0 for 30 day supply and 90 day supply will be 200.00.  He has the wound vac supplies at the bedside.     Barriers to Discharge: Continued Medical Work up   Patient Goals and CMS Choice Patient states their goals for this hospitalization and ongoing recovery are:: To get better          Discharge Placement                       Discharge Plan and Services Additional resources added to the After Visit Summary for   In-house Referral: Clinical Social Work                                   Social Drivers of Health (SDOH) Interventions SDOH Screenings   Food Insecurity: No Food Insecurity (01/27/2024)  Housing: Low Risk  (01/27/2024)  Transportation Needs: No Transportation Needs (01/27/2024)  Utilities: Not At Risk (01/27/2024)  Social Connections: Socially Integrated (01/27/2024)  Tobacco Use: Low Risk  (02/02/2024)     Readmission Risk Interventions     No data to display

## 2024-02-08 NOTE — Inpatient Diabetes Management (Signed)
 Inpatient Diabetes Program Recommendations  AACE/ADA: New Consensus Statement on Inpatient Glycemic Control (2015)  Target Ranges:  Prepandial:   less than 140 mg/dL      Peak postprandial:   less than 180 mg/dL (1-2 hours)      Critically ill patients:  140 - 180 mg/dL   Lab Results  Component Value Date   GLUCAP 179 (H) 02/08/2024   HGBA1C 9.8 (H) 01/27/2024    Review of Glycemic Control  Latest Reference Range & Units 02/06/24 21:44 02/07/24 06:21 02/07/24 10:30 02/07/24 16:19 02/07/24 21:17 02/08/24 06:21 02/08/24 11:08  Glucose-Capillary 70 - 99 mg/dL 161 (H) 096 (H) 045 (H) 181 (H) 158 (H) 179 (H) 179 (H)   Diabetes history: DM 2 Outpatient Diabetes medications:  Glucotrol XL 10 mg daily, Jardiance 10 mg daily, Rybelsus 3 mg Current orders for Inpatient glycemic control:  Novolog  0-15 units tid with meals and HS Novolog  10 units tid with meals Semglee  30 units daily Inpatient Diabetes Program Recommendations:    Referral received.  Spoke to patient again regarding d/c regimen of Novolog  10 units tid with meals and Semglee  30 units daily.  We discussed timing of medications, goal blood sugars, and reviewed use of insulin  pen.  Patient states that he feels better about things and plans to f/u with PCP.  He has CGM that will be applied when he gets home. We briefly reviewed use of CGM as well. Father states he uses CGM and will help patient when they get home. We also reviewed hypoglycemia signs, symptoms and treatment.  Patient verbalized understanding.    Thanks,  Josefa Ni, RN, BC-ADM Inpatient Diabetes Coordinator Pager [phone]  (8a-5p)

## 2024-02-08 NOTE — Plan of Care (Signed)
   Problem: Education: Goal: Knowledge of General Education information will improve Description: Including pain rating scale, medication(s)/side effects and non-pharmacologic comfort measures Outcome: Progressing   Problem: Clinical Measurements: Goal: Ability to maintain clinical measurements within normal limits will improve Outcome: Progressing   Problem: Activity: Goal: Risk for activity intolerance will decrease Outcome: Progressing   Problem: Pain Managment: Goal: General experience of comfort will improve and/or be controlled Outcome: Progressing   Problem: Skin Integrity: Goal: Risk for impaired skin integrity will decrease Outcome: Progressing

## 2024-02-09 DIAGNOSIS — M869 Osteomyelitis, unspecified: Secondary | ICD-10-CM | POA: Diagnosis not present

## 2024-02-09 DIAGNOSIS — A409 Streptococcal sepsis, unspecified: Secondary | ICD-10-CM | POA: Diagnosis not present

## 2024-02-09 DIAGNOSIS — E1169 Type 2 diabetes mellitus with other specified complication: Secondary | ICD-10-CM | POA: Diagnosis not present

## 2024-02-09 DIAGNOSIS — Z7901 Long term (current) use of anticoagulants: Secondary | ICD-10-CM | POA: Diagnosis not present

## 2024-02-09 DIAGNOSIS — T8140XA Infection following a procedure, unspecified, initial encounter: Secondary | ICD-10-CM | POA: Diagnosis not present

## 2024-02-09 DIAGNOSIS — Z794 Long term (current) use of insulin: Secondary | ICD-10-CM | POA: Diagnosis not present

## 2024-02-09 DIAGNOSIS — I82402 Acute embolism and thrombosis of unspecified deep veins of left lower extremity: Secondary | ICD-10-CM | POA: Diagnosis not present

## 2024-02-09 DIAGNOSIS — E1151 Type 2 diabetes mellitus with diabetic peripheral angiopathy without gangrene: Secondary | ICD-10-CM | POA: Diagnosis not present

## 2024-02-09 DIAGNOSIS — Z6828 Body mass index (BMI) 28.0-28.9, adult: Secondary | ICD-10-CM | POA: Diagnosis not present

## 2024-02-09 DIAGNOSIS — F1090 Alcohol use, unspecified, uncomplicated: Secondary | ICD-10-CM | POA: Diagnosis not present

## 2024-02-09 DIAGNOSIS — E669 Obesity, unspecified: Secondary | ICD-10-CM | POA: Diagnosis not present

## 2024-02-09 DIAGNOSIS — E785 Hyperlipidemia, unspecified: Secondary | ICD-10-CM | POA: Diagnosis not present

## 2024-02-09 DIAGNOSIS — L89892 Pressure ulcer of other site, stage 2: Secondary | ICD-10-CM | POA: Diagnosis not present

## 2024-02-09 DIAGNOSIS — S91302A Unspecified open wound, left foot, initial encounter: Secondary | ICD-10-CM | POA: Diagnosis not present

## 2024-02-09 DIAGNOSIS — I10 Essential (primary) hypertension: Secondary | ICD-10-CM | POA: Diagnosis not present

## 2024-02-09 DIAGNOSIS — K5900 Constipation, unspecified: Secondary | ICD-10-CM | POA: Diagnosis not present

## 2024-02-10 DIAGNOSIS — S91302A Unspecified open wound, left foot, initial encounter: Secondary | ICD-10-CM | POA: Diagnosis not present

## 2024-02-10 DIAGNOSIS — T8140XA Infection following a procedure, unspecified, initial encounter: Secondary | ICD-10-CM | POA: Diagnosis not present

## 2024-02-11 DIAGNOSIS — T8140XA Infection following a procedure, unspecified, initial encounter: Secondary | ICD-10-CM | POA: Diagnosis not present

## 2024-02-11 DIAGNOSIS — S91302A Unspecified open wound, left foot, initial encounter: Secondary | ICD-10-CM | POA: Diagnosis not present

## 2024-02-12 DIAGNOSIS — T8140XA Infection following a procedure, unspecified, initial encounter: Secondary | ICD-10-CM | POA: Diagnosis not present

## 2024-02-12 DIAGNOSIS — S91302A Unspecified open wound, left foot, initial encounter: Secondary | ICD-10-CM | POA: Diagnosis not present

## 2024-02-13 DIAGNOSIS — Z7901 Long term (current) use of anticoagulants: Secondary | ICD-10-CM | POA: Diagnosis not present

## 2024-02-13 DIAGNOSIS — M869 Osteomyelitis, unspecified: Secondary | ICD-10-CM | POA: Diagnosis not present

## 2024-02-13 DIAGNOSIS — I10 Essential (primary) hypertension: Secondary | ICD-10-CM | POA: Diagnosis not present

## 2024-02-13 DIAGNOSIS — E669 Obesity, unspecified: Secondary | ICD-10-CM | POA: Diagnosis not present

## 2024-02-13 DIAGNOSIS — A409 Streptococcal sepsis, unspecified: Secondary | ICD-10-CM | POA: Diagnosis not present

## 2024-02-13 DIAGNOSIS — E1169 Type 2 diabetes mellitus with other specified complication: Secondary | ICD-10-CM | POA: Diagnosis not present

## 2024-02-13 DIAGNOSIS — Z794 Long term (current) use of insulin: Secondary | ICD-10-CM | POA: Diagnosis not present

## 2024-02-13 DIAGNOSIS — E785 Hyperlipidemia, unspecified: Secondary | ICD-10-CM | POA: Diagnosis not present

## 2024-02-13 DIAGNOSIS — I82402 Acute embolism and thrombosis of unspecified deep veins of left lower extremity: Secondary | ICD-10-CM | POA: Diagnosis not present

## 2024-02-13 DIAGNOSIS — Z6828 Body mass index (BMI) 28.0-28.9, adult: Secondary | ICD-10-CM | POA: Diagnosis not present

## 2024-02-13 DIAGNOSIS — T8140XA Infection following a procedure, unspecified, initial encounter: Secondary | ICD-10-CM | POA: Diagnosis not present

## 2024-02-13 DIAGNOSIS — F1090 Alcohol use, unspecified, uncomplicated: Secondary | ICD-10-CM | POA: Diagnosis not present

## 2024-02-13 DIAGNOSIS — K5900 Constipation, unspecified: Secondary | ICD-10-CM | POA: Diagnosis not present

## 2024-02-13 DIAGNOSIS — S91302A Unspecified open wound, left foot, initial encounter: Secondary | ICD-10-CM | POA: Diagnosis not present

## 2024-02-13 DIAGNOSIS — E1151 Type 2 diabetes mellitus with diabetic peripheral angiopathy without gangrene: Secondary | ICD-10-CM | POA: Diagnosis not present

## 2024-02-13 DIAGNOSIS — L89892 Pressure ulcer of other site, stage 2: Secondary | ICD-10-CM | POA: Diagnosis not present

## 2024-02-14 DIAGNOSIS — A409 Streptococcal sepsis, unspecified: Secondary | ICD-10-CM | POA: Diagnosis not present

## 2024-02-14 DIAGNOSIS — G629 Polyneuropathy, unspecified: Secondary | ICD-10-CM | POA: Diagnosis not present

## 2024-02-14 DIAGNOSIS — F1090 Alcohol use, unspecified, uncomplicated: Secondary | ICD-10-CM | POA: Diagnosis not present

## 2024-02-14 DIAGNOSIS — Z7901 Long term (current) use of anticoagulants: Secondary | ICD-10-CM | POA: Diagnosis not present

## 2024-02-14 DIAGNOSIS — E785 Hyperlipidemia, unspecified: Secondary | ICD-10-CM | POA: Diagnosis not present

## 2024-02-14 DIAGNOSIS — T8140XA Infection following a procedure, unspecified, initial encounter: Secondary | ICD-10-CM | POA: Diagnosis not present

## 2024-02-14 DIAGNOSIS — M869 Osteomyelitis, unspecified: Secondary | ICD-10-CM | POA: Diagnosis not present

## 2024-02-14 DIAGNOSIS — E782 Mixed hyperlipidemia: Secondary | ICD-10-CM | POA: Diagnosis not present

## 2024-02-14 DIAGNOSIS — L89892 Pressure ulcer of other site, stage 2: Secondary | ICD-10-CM | POA: Diagnosis not present

## 2024-02-14 DIAGNOSIS — I82402 Acute embolism and thrombosis of unspecified deep veins of left lower extremity: Secondary | ICD-10-CM | POA: Diagnosis not present

## 2024-02-14 DIAGNOSIS — Z794 Long term (current) use of insulin: Secondary | ICD-10-CM | POA: Diagnosis not present

## 2024-02-14 DIAGNOSIS — E1169 Type 2 diabetes mellitus with other specified complication: Secondary | ICD-10-CM | POA: Diagnosis not present

## 2024-02-14 DIAGNOSIS — S91302A Unspecified open wound, left foot, initial encounter: Secondary | ICD-10-CM | POA: Diagnosis not present

## 2024-02-14 DIAGNOSIS — Z6828 Body mass index (BMI) 28.0-28.9, adult: Secondary | ICD-10-CM | POA: Diagnosis not present

## 2024-02-14 DIAGNOSIS — I10 Essential (primary) hypertension: Secondary | ICD-10-CM | POA: Diagnosis not present

## 2024-02-14 DIAGNOSIS — E1151 Type 2 diabetes mellitus with diabetic peripheral angiopathy without gangrene: Secondary | ICD-10-CM | POA: Diagnosis not present

## 2024-02-14 DIAGNOSIS — K5900 Constipation, unspecified: Secondary | ICD-10-CM | POA: Diagnosis not present

## 2024-02-14 DIAGNOSIS — E669 Obesity, unspecified: Secondary | ICD-10-CM | POA: Diagnosis not present

## 2024-02-15 DIAGNOSIS — T8140XA Infection following a procedure, unspecified, initial encounter: Secondary | ICD-10-CM | POA: Diagnosis not present

## 2024-02-15 DIAGNOSIS — S91302A Unspecified open wound, left foot, initial encounter: Secondary | ICD-10-CM | POA: Diagnosis not present

## 2024-02-16 DIAGNOSIS — L89892 Pressure ulcer of other site, stage 2: Secondary | ICD-10-CM | POA: Diagnosis not present

## 2024-02-16 DIAGNOSIS — Z7901 Long term (current) use of anticoagulants: Secondary | ICD-10-CM | POA: Diagnosis not present

## 2024-02-16 DIAGNOSIS — A409 Streptococcal sepsis, unspecified: Secondary | ICD-10-CM | POA: Diagnosis not present

## 2024-02-16 DIAGNOSIS — S91302A Unspecified open wound, left foot, initial encounter: Secondary | ICD-10-CM | POA: Diagnosis not present

## 2024-02-16 DIAGNOSIS — E669 Obesity, unspecified: Secondary | ICD-10-CM | POA: Diagnosis not present

## 2024-02-16 DIAGNOSIS — Z794 Long term (current) use of insulin: Secondary | ICD-10-CM | POA: Diagnosis not present

## 2024-02-16 DIAGNOSIS — I82402 Acute embolism and thrombosis of unspecified deep veins of left lower extremity: Secondary | ICD-10-CM | POA: Diagnosis not present

## 2024-02-16 DIAGNOSIS — E1169 Type 2 diabetes mellitus with other specified complication: Secondary | ICD-10-CM | POA: Diagnosis not present

## 2024-02-16 DIAGNOSIS — I10 Essential (primary) hypertension: Secondary | ICD-10-CM | POA: Diagnosis not present

## 2024-02-16 DIAGNOSIS — T8140XA Infection following a procedure, unspecified, initial encounter: Secondary | ICD-10-CM | POA: Diagnosis not present

## 2024-02-16 DIAGNOSIS — M869 Osteomyelitis, unspecified: Secondary | ICD-10-CM | POA: Diagnosis not present

## 2024-02-16 DIAGNOSIS — E1151 Type 2 diabetes mellitus with diabetic peripheral angiopathy without gangrene: Secondary | ICD-10-CM | POA: Diagnosis not present

## 2024-02-16 DIAGNOSIS — E785 Hyperlipidemia, unspecified: Secondary | ICD-10-CM | POA: Diagnosis not present

## 2024-02-16 DIAGNOSIS — K5900 Constipation, unspecified: Secondary | ICD-10-CM | POA: Diagnosis not present

## 2024-02-16 DIAGNOSIS — Z6828 Body mass index (BMI) 28.0-28.9, adult: Secondary | ICD-10-CM | POA: Diagnosis not present

## 2024-02-16 DIAGNOSIS — F1090 Alcohol use, unspecified, uncomplicated: Secondary | ICD-10-CM | POA: Diagnosis not present

## 2024-02-17 DIAGNOSIS — T8140XA Infection following a procedure, unspecified, initial encounter: Secondary | ICD-10-CM | POA: Diagnosis not present

## 2024-02-17 DIAGNOSIS — S91302A Unspecified open wound, left foot, initial encounter: Secondary | ICD-10-CM | POA: Diagnosis not present

## 2024-02-18 DIAGNOSIS — T8140XA Infection following a procedure, unspecified, initial encounter: Secondary | ICD-10-CM | POA: Diagnosis not present

## 2024-02-18 DIAGNOSIS — S91302A Unspecified open wound, left foot, initial encounter: Secondary | ICD-10-CM | POA: Diagnosis not present

## 2024-02-19 DIAGNOSIS — S91302A Unspecified open wound, left foot, initial encounter: Secondary | ICD-10-CM | POA: Diagnosis not present

## 2024-02-19 DIAGNOSIS — T8140XA Infection following a procedure, unspecified, initial encounter: Secondary | ICD-10-CM | POA: Diagnosis not present

## 2024-02-20 ENCOUNTER — Ambulatory Visit (INDEPENDENT_AMBULATORY_CARE_PROVIDER_SITE_OTHER): Admitting: Podiatry

## 2024-02-20 DIAGNOSIS — T8140XA Infection following a procedure, unspecified, initial encounter: Secondary | ICD-10-CM | POA: Diagnosis not present

## 2024-02-20 DIAGNOSIS — S91302A Unspecified open wound, left foot, initial encounter: Secondary | ICD-10-CM | POA: Diagnosis not present

## 2024-02-20 DIAGNOSIS — I96 Gangrene, not elsewhere classified: Secondary | ICD-10-CM

## 2024-02-20 DIAGNOSIS — E0843 Diabetes mellitus due to underlying condition with diabetic autonomic (poly)neuropathy: Secondary | ICD-10-CM

## 2024-02-20 DIAGNOSIS — A480 Gas gangrene: Secondary | ICD-10-CM

## 2024-02-20 NOTE — Progress Notes (Signed)
 Subjective:  Patient ID: Dakota Becker, male    DOB: 1976-02-26,  MRN: [id number]  Chief Complaint  Patient presents with   Post-op Follow-up    Left foot. Currently has wound-vac. This is changed every Tuesday & Friday. Currently no pain. Takes gabapentin . Swelling has decreased and per wound care nurse there has not been much drainage.     DOS: 01/27/24 Proc: 1. Partial first ray amputation of left foot 2. 2nd toe amputation at MPJ level of left foot 3. Application dissolvable antibiotic beads left foot  DOS: 01/29/24 1.  Repeat irrigation and debridement partial second metatarsal resection, left foot 2.  Dermal allograft application 5 x 4 cm, left foot 3.  Application negative pressure wound therapy 5 x 4 cm, left foot  48 y.o. male seen for post op check.  Now approximately 3 weeks out from above procedures.  Denies pain says wound VAC is being changed twice weekly for home health as instructed.  Swelling is decreased not much drainage.  Taking antibiotics p.o. as prescribed by infectious disease.  Doing minimal weightbearing to the heel in the postop shoe to get around his house otherwise staying off his foot as instructed.  Review of Systems: Negative except as noted in the HPI. Denies N/V/F/Ch.   Objective:   Constitutional Well developed. Well nourished.  Vascular Foot warm and well perfused. Capillary refill normal to all digits.   No calf pain with palpation  Neurologic Normal speech. Oriented to person, place, and time. Epicritic sensation absent to left foot  Dermatologic The area of open wound at the dorsal medial aspect of the left foot appears to be healthy with granular and fibrotic tissues present in wound bed.  Upon debridement of some overlying fibrotic tissue there is healthy granular bleeding tissue underlying without evidence of residual infection.  Wound measures approximately 4 x 5 cm.  Some maceration of the tissue margins.  Overall appears viable and healthy  at this time.    Orthopedic: Status post partial 1st and 2nd ray amputation   Radiographs: 01/29/2024 repeat amputation of the first ray and out of the proximal metatarsal shaft and repeat amputation of the second ray and out of the proximal mid metatarsal shaft.  Pathology: 01/27/2024 A. FOOT, PARTIAL LEFT FIRST RAY AND 2ND TOE, AMPUTATION:  - Benign bone with mild acute inflammation and bacterial colonies  - Margin shows acute inflammation and necrosis, and appears partially  viable  - No malignancy identified   Micro: 01/27/2024 Enterococcus faecalis and group F strep  Assessment:   48 y.o. male with PMHx significant for DM2 with gas gangrene and osteomyelitis of left hallux and gangrene of 2nd toe with concern for underlying osteomyelitis   Now s/p partial 1st and 2nd ray amputation with graft and wound vac application.  Plan:  Patient was evaluated and treated and all questions answered.  3 weeks s/p above procedure -Progressing As expected postoperatively- continue wound VAC therapy twice weekly -Debrided the wound of any necrotic or fibrotic tissues present in wound bed at this time to healthy bleeding base and new dressing was applied -XR: Expected postop changes -WB Status: Heel weightbearing in postop shoe for short distance/transfers -Sutures: Removed staples and silicone layer from the Integra graft which was mostly incorporated at this time. -Medications/ABX: Continue antibiotics per ID recommendations currently on long-term antibiotic therapy -Dressing: Continue twice weekly wound VAC.  Leave dressing I applied on until Friday when the wound VAC will be replaced by home health -  F/u Plan: Follow-up in 2 weeks        Maridee Shoemaker, DPM [facility] / [facility]

## 2024-02-21 DIAGNOSIS — S91302A Unspecified open wound, left foot, initial encounter: Secondary | ICD-10-CM | POA: Diagnosis not present

## 2024-02-21 DIAGNOSIS — T8140XA Infection following a procedure, unspecified, initial encounter: Secondary | ICD-10-CM | POA: Diagnosis not present

## 2024-02-22 DIAGNOSIS — T8140XA Infection following a procedure, unspecified, initial encounter: Secondary | ICD-10-CM | POA: Diagnosis not present

## 2024-02-22 DIAGNOSIS — S91302A Unspecified open wound, left foot, initial encounter: Secondary | ICD-10-CM | POA: Diagnosis not present

## 2024-02-23 DIAGNOSIS — T8140XA Infection following a procedure, unspecified, initial encounter: Secondary | ICD-10-CM | POA: Diagnosis not present

## 2024-02-23 DIAGNOSIS — S91302A Unspecified open wound, left foot, initial encounter: Secondary | ICD-10-CM | POA: Diagnosis not present

## 2024-02-24 DIAGNOSIS — F1090 Alcohol use, unspecified, uncomplicated: Secondary | ICD-10-CM | POA: Diagnosis not present

## 2024-02-24 DIAGNOSIS — A409 Streptococcal sepsis, unspecified: Secondary | ICD-10-CM | POA: Diagnosis not present

## 2024-02-24 DIAGNOSIS — E785 Hyperlipidemia, unspecified: Secondary | ICD-10-CM | POA: Diagnosis not present

## 2024-02-24 DIAGNOSIS — K5900 Constipation, unspecified: Secondary | ICD-10-CM | POA: Diagnosis not present

## 2024-02-24 DIAGNOSIS — Z7901 Long term (current) use of anticoagulants: Secondary | ICD-10-CM | POA: Diagnosis not present

## 2024-02-24 DIAGNOSIS — T8140XA Infection following a procedure, unspecified, initial encounter: Secondary | ICD-10-CM | POA: Diagnosis not present

## 2024-02-24 DIAGNOSIS — Z794 Long term (current) use of insulin: Secondary | ICD-10-CM | POA: Diagnosis not present

## 2024-02-24 DIAGNOSIS — M869 Osteomyelitis, unspecified: Secondary | ICD-10-CM | POA: Diagnosis not present

## 2024-02-24 DIAGNOSIS — E1169 Type 2 diabetes mellitus with other specified complication: Secondary | ICD-10-CM | POA: Diagnosis not present

## 2024-02-24 DIAGNOSIS — E669 Obesity, unspecified: Secondary | ICD-10-CM | POA: Diagnosis not present

## 2024-02-24 DIAGNOSIS — L89892 Pressure ulcer of other site, stage 2: Secondary | ICD-10-CM | POA: Diagnosis not present

## 2024-02-24 DIAGNOSIS — S91302A Unspecified open wound, left foot, initial encounter: Secondary | ICD-10-CM | POA: Diagnosis not present

## 2024-02-24 DIAGNOSIS — Z6828 Body mass index (BMI) 28.0-28.9, adult: Secondary | ICD-10-CM | POA: Diagnosis not present

## 2024-02-24 DIAGNOSIS — I10 Essential (primary) hypertension: Secondary | ICD-10-CM | POA: Diagnosis not present

## 2024-02-24 DIAGNOSIS — E1151 Type 2 diabetes mellitus with diabetic peripheral angiopathy without gangrene: Secondary | ICD-10-CM | POA: Diagnosis not present

## 2024-02-24 DIAGNOSIS — I82402 Acute embolism and thrombosis of unspecified deep veins of left lower extremity: Secondary | ICD-10-CM | POA: Diagnosis not present

## 2024-02-25 DIAGNOSIS — T8140XA Infection following a procedure, unspecified, initial encounter: Secondary | ICD-10-CM | POA: Diagnosis not present

## 2024-02-25 DIAGNOSIS — S91302A Unspecified open wound, left foot, initial encounter: Secondary | ICD-10-CM | POA: Diagnosis not present

## 2024-02-26 DIAGNOSIS — T8140XA Infection following a procedure, unspecified, initial encounter: Secondary | ICD-10-CM | POA: Diagnosis not present

## 2024-02-26 DIAGNOSIS — S91302A Unspecified open wound, left foot, initial encounter: Secondary | ICD-10-CM | POA: Diagnosis not present

## 2024-02-27 ENCOUNTER — Other Ambulatory Visit: Payer: Self-pay

## 2024-02-27 ENCOUNTER — Encounter: Payer: Self-pay | Admitting: Internal Medicine

## 2024-02-27 ENCOUNTER — Ambulatory Visit: Payer: Self-pay | Admitting: Internal Medicine

## 2024-02-27 VITALS — BP 139/89 | HR 104 | Temp 97.2°F

## 2024-02-27 DIAGNOSIS — Z7901 Long term (current) use of anticoagulants: Secondary | ICD-10-CM | POA: Diagnosis not present

## 2024-02-27 DIAGNOSIS — K5900 Constipation, unspecified: Secondary | ICD-10-CM | POA: Diagnosis not present

## 2024-02-27 DIAGNOSIS — E785 Hyperlipidemia, unspecified: Secondary | ICD-10-CM | POA: Diagnosis not present

## 2024-02-27 DIAGNOSIS — A409 Streptococcal sepsis, unspecified: Secondary | ICD-10-CM | POA: Diagnosis not present

## 2024-02-27 DIAGNOSIS — L89892 Pressure ulcer of other site, stage 2: Secondary | ICD-10-CM | POA: Diagnosis not present

## 2024-02-27 DIAGNOSIS — Z6828 Body mass index (BMI) 28.0-28.9, adult: Secondary | ICD-10-CM | POA: Diagnosis not present

## 2024-02-27 DIAGNOSIS — E1169 Type 2 diabetes mellitus with other specified complication: Secondary | ICD-10-CM | POA: Diagnosis not present

## 2024-02-27 DIAGNOSIS — T8140XA Infection following a procedure, unspecified, initial encounter: Secondary | ICD-10-CM | POA: Diagnosis not present

## 2024-02-27 DIAGNOSIS — E669 Obesity, unspecified: Secondary | ICD-10-CM | POA: Diagnosis not present

## 2024-02-27 DIAGNOSIS — Z794 Long term (current) use of insulin: Secondary | ICD-10-CM | POA: Diagnosis not present

## 2024-02-27 DIAGNOSIS — M869 Osteomyelitis, unspecified: Secondary | ICD-10-CM

## 2024-02-27 DIAGNOSIS — I82402 Acute embolism and thrombosis of unspecified deep veins of left lower extremity: Secondary | ICD-10-CM | POA: Diagnosis not present

## 2024-02-27 DIAGNOSIS — I10 Essential (primary) hypertension: Secondary | ICD-10-CM | POA: Diagnosis not present

## 2024-02-27 DIAGNOSIS — E1151 Type 2 diabetes mellitus with diabetic peripheral angiopathy without gangrene: Secondary | ICD-10-CM | POA: Diagnosis not present

## 2024-02-27 DIAGNOSIS — S91302A Unspecified open wound, left foot, initial encounter: Secondary | ICD-10-CM | POA: Diagnosis not present

## 2024-02-27 DIAGNOSIS — F1090 Alcohol use, unspecified, uncomplicated: Secondary | ICD-10-CM | POA: Diagnosis not present

## 2024-02-27 NOTE — Progress Notes (Signed)
 Patient Active Problem List   Diagnosis Date Noted   Nausea 02/04/2024   Bacteremia 02/02/2024   Constipation 01/31/2024   Hypertension 01/31/2024   Acute deep vein thrombosis (DVT) of left lower extremity (HCC) 01/31/2024   Type 2 diabetes mellitus without complication, without long-term current use of insulin  (HCC) 01/31/2024   Toe osteomyelitis, left (HCC) 01/27/2024   Gas gangrene of foot (HCC) 01/27/2024   Gangrene of toe of left foot (HCC) 01/27/2024   Controlled type 2 diabetes mellitus without complication, without long-term current use of insulin  (HCC) 01/27/2024   Hypercalcemia 01/27/2024    Patient's Medications  New Prescriptions   No medications on file  Previous Medications   ACCU-CHEK SOFTCLIX LANCETS LANCETS    Use in the morning, at noon, and at bedtime.   AMOXICILLIN -CLAVULANATE (AUGMENTIN ) 875-125 MG TABLET    Take 1 tablet by mouth every 12 (twelve) hours.   APIXABAN  (ELIQUIS ) 5 MG TABS TABLET    Take 1 tablet (5 mg total) by mouth 2 (two) times daily.   BLOOD GLUCOSE MONITORING SUPPL (BLOOD GLUCOSE MONITOR SYSTEM) W/DEVICE KIT    Use in the morning, at noon, and at bedtime.   GABAPENTIN  (NEURONTIN ) 300 MG CAPSULE    Take 300 mg by mouth 3 (three) times daily.   GLUCOSE BLOOD (BLOOD GLUCOSE TEST STRIPS) STRP    Use in the morning, at noon, and at bedtime.   INSULIN  ASPART (NOVOLOG ) 100 UNIT/ML FLEXPEN    Inject 10 Units into the skin 3 (three) times daily with meals.   INSULIN  GLARGINE (LANTUS ) 100 UNIT/ML SOLOSTAR PEN    Inject 30 Units into the skin daily.   LANCET DEVICE MISC    1 each by Does not apply route in the morning, at noon, and at bedtime. May substitute to any manufacturer covered by patient's insurance.   LISINOPRIL  (ZESTRIL ) 20 MG TABLET    Take 20 mg by mouth daily.   PANTOPRAZOLE  (PROTONIX ) 40 MG TABLET    Take 1 tablet (40 mg total) by mouth daily at 6 (six) AM.   SIMVASTATIN (ZOCOR) 20 MG TABLET    Take 20 mg by mouth daily.   Modified Medications   No medications on file  Discontinued Medications   No medications on file    Subjective: 48 year old male with past medical history of diabetes, hyperlipidemia hypertension presents for hospital follow-up of strep constellatus petrosectomy with left foot gas [place]lickman/osteomyelitis.  Patient presented for worsening wound left foot and chills.CT of left foot showed necrotizing fasciitis of great toe, emphysema osteomyelitis or osteonecrosis taken to the OR. Blood cultures grew strep constellatus. He underwent first ray amputation second toe amputation at [facility] with podiatry. Or cultures growing E faecalis, group F strep, Prevotella species.  Taken to the OR again for repeat I&D and VAC placement.  TTE and TEE without vegetation.  Discharged on Augmentin  complete 6 weeks of antibiotics from or EOT 6/15 Today: No issues abx. No missed doses. Seen by podiatry in the interim. Wound vac on .  Review of Systems: Review of Systems  All other systems reviewed and are negative.   Past Medical History:  Diagnosis Date   Diabetes mellitus without complication (HCC)    Hyperlipidemia    Hypertension     Social History   Tobacco Use   Smoking status: Never   Smokeless tobacco: Never  Vaping Use   Vaping status: Never Used  Substance Use Topics   Alcohol  use: Not Currently    Alcohol/week: 0.0 - 2.0 standard drinks of alcohol   Drug use: No    Family History  Problem Relation Age of Onset   Diabetes Mother    Diabetes Father     Allergies  Allergen Reactions   Lactose Intolerance (Gi) Other (See Comments)    GI upset, gas    Health Maintenance  Topic Date Due   FOOT EXAM  Never done   OPHTHALMOLOGY EXAM  Never done   Diabetic kidney evaluation - Urine ACR  Never done   Hepatitis C Screening  Never done   DTaP/Tdap/Td (1 - Tdap) Never done   Pneumococcal Vaccine 33-25 Years old (1 of 2 - PCV) Never done   Colonoscopy  Never done   COVID-19 Vaccine (5 -  2024-25 season) 05/28/2023   INFLUENZA VACCINE  04/26/2024   HEMOGLOBIN A1C  07/29/2024   Diabetic kidney evaluation - eGFR measurement  02/07/2025   HIV Screening  Completed   HPV VACCINES  Aged Out   Meningococcal B Vaccine  Aged Out    Objective:  Vitals:   02/27/24 0851  BP: 139/89  Pulse: (!) 104  Temp: (!) 97.2 F (36.2 C)  TempSrc: Temporal  SpO2: 97%   There is no height or weight on file to calculate BMI.  Physical Exam Constitutional:      General: He is not in acute distress.    Appearance: He is normal weight. He is not toxic-appearing.  HENT:     Head: Normocephalic and atraumatic.     Right Ear: External ear normal.     Left Ear: External ear normal.     Nose: No congestion or rhinorrhea.     Mouth/Throat:     Mouth: Mucous membranes are moist.     Pharynx: Oropharynx is clear.  Eyes:     Extraocular Movements: Extraocular movements intact.     Conjunctiva/sclera: Conjunctivae normal.     Pupils: Pupils are equal, round, and reactive to light.  Cardiovascular:     Rate and Rhythm: Normal rate and regular rhythm.     Heart sounds: No murmur heard.    No friction rub. No gallop.  Pulmonary:     Effort: Pulmonary effort is normal.     Breath sounds: Normal breath sounds.  Abdominal:     General: Abdomen is flat. Bowel sounds are normal.     Palpations: Abdomen is soft.  Musculoskeletal:        General: No swelling.     Cervical back: Normal range of motion and neck supple.  Skin:    General: Skin is warm and dry.  Neurological:     General: No focal deficit present.     Mental Status: He is oriented to person, place, and time.  Psychiatric:        Mood and Affect: Mood normal.    Physical Exam  Lef toot wound vac on.  Lab Results Lab Results  Component Value Date   WBC 10.3 02/08/2024   HGB 12.2 (L) 02/08/2024   HCT 37.3 (L) 02/08/2024   MCV 87.6 02/08/2024   PLT 549 (H) 02/08/2024    Lab Results  Component Value Date   CREATININE  0.73 02/08/2024   BUN 8 02/08/2024   NA 135 02/08/2024   K 4.2 02/08/2024   CL 99 02/08/2024   CO2 27 02/08/2024    Lab Results  Component Value Date   ALT 16 02/08/2024   AST 12 (L)  02/08/2024   ALKPHOS 60 02/08/2024   BILITOT 0.2 02/08/2024    Lab Results  Component Value Date   CHOL (H) 02/22/2008    231        ATP III CLASSIFICATION:  <200     mg/dL   Desirable  161-096  mg/dL   Borderline High  >=045    mg/dL   High   HDL 16 (L) 40/98/1191   LDLCALC (H) 02/22/2008    160        Total Cholesterol/HDL:CHD Risk Coronary Heart Disease Risk Table                     Men   Women  1/2 Average Risk   3.4   3.3   TRIG 273 (H) 02/22/2008   CHOLHDL 14.4 02/22/2008   No results found for: "LABRPR", "RPRTITER" No results found for: "HIV1RNAQUANT", "HIV1RNAVL", "CD4TABS"   Problem List Items Addressed This Visit   None  Results   Assessment/Plan #Strep constellatus bacteremia secondary to left foot [place]/osteomyelitis #Status post second toe amputation with cultures growing E faecalis, group F strep, Prevotella species - Patient was admitted for worsening left foot wound.  Blood cultures on admission grew strep constellatus.  Or cultures as above.  Workup included TTE and TEE without vegetation.  He was taken back to the OR on 5/5 for another I&D discharged on Augmentin  x 6 weeks. -seen by podiatry dr Rosemarie Conquest on 5/27 and noted wound prrogresing as expected. nOted" Upon debridement of some overlying fibrotic tissue there is healthy granular bleeding tissue underlying without evidence of residual infection Plan: -augmetin eot 6/15 -labs today --follow in one month to assesso f of abx on 6/30  #Diabetes mellitus - Needs glycemic control for optimal wound healing    [name], MD [facility] 02/27/2024, 8:54 AM   I have personally spent 42 minutes involved in face-to-face and non-face-to-face activities for  this patient on the day of the visit. Professional time spent includes the following activities: Preparing to see the patient (review of tests), Obtaining and/or reviewing separately obtained history (admission/discharge record), Performing a medically appropriate examination and/or evaluation , Ordering medications/tests/procedures, referring and communicating with other health care professionals, Documenting clinical information in the EMR, Independently interpreting results (not separately reported), Communicating results to the patient/family/caregiver, Counseling and educating the patient/family/caregiver and Care coordination (not separately reported).

## 2024-02-27 NOTE — Patient Instructions (Signed)
 Augmentin  last dose on 6/15 F/u in one month

## 2024-02-28 DIAGNOSIS — T8140XA Infection following a procedure, unspecified, initial encounter: Secondary | ICD-10-CM | POA: Diagnosis not present

## 2024-02-28 DIAGNOSIS — S91302A Unspecified open wound, left foot, initial encounter: Secondary | ICD-10-CM | POA: Diagnosis not present

## 2024-02-28 LAB — CBC WITH DIFFERENTIAL/PLATELET
Absolute Lymphocytes: 1789 {cells}/uL (ref 850–3900)
Absolute Monocytes: 394 {cells}/uL (ref 200–950)
Basophils Absolute: 22 {cells}/uL (ref 0–200)
Basophils Relative: 0.3 %
Eosinophils Absolute: 131 {cells}/uL (ref 15–500)
Eosinophils Relative: 1.8 %
HCT: 42 % (ref 38.5–50.0)
Hemoglobin: 13.4 g/dL (ref 13.2–17.1)
MCH: 28.5 pg (ref 27.0–33.0)
MCHC: 31.9 g/dL — ABNORMAL LOW (ref 32.0–36.0)
MCV: 89.4 fL (ref 80.0–100.0)
MPV: 9.6 fL (ref 7.5–12.5)
Monocytes Relative: 5.4 %
Neutro Abs: 4964 {cells}/uL (ref 1500–7800)
Neutrophils Relative %: 68 %
Platelets: 407 10*3/uL — ABNORMAL HIGH (ref 140–400)
RBC: 4.7 10*6/uL (ref 4.20–5.80)
RDW: 13.2 % (ref 11.0–15.0)
Total Lymphocyte: 24.5 %
WBC: 7.3 10*3/uL (ref 3.8–10.8)

## 2024-02-28 LAB — COMPLETE METABOLIC PANEL WITHOUT GFR
AG Ratio: 1.2 (calc) (ref 1.0–2.5)
ALT: 9 U/L (ref 9–46)
AST: 10 U/L (ref 10–40)
Albumin: 4 g/dL (ref 3.6–5.1)
Alkaline phosphatase (APISO): 60 U/L (ref 36–130)
BUN: 20 mg/dL (ref 7–25)
CO2: 29 mmol/L (ref 20–32)
Calcium: 10.2 mg/dL (ref 8.6–10.3)
Chloride: 101 mmol/L (ref 98–110)
Creat: 0.89 mg/dL (ref 0.60–1.29)
Globulin: 3.4 g/dL (ref 1.9–3.7)
Glucose, Bld: 195 mg/dL — ABNORMAL HIGH (ref 65–99)
Potassium: 5.2 mmol/L (ref 3.5–5.3)
Sodium: 137 mmol/L (ref 135–146)
Total Bilirubin: 0.4 mg/dL (ref 0.2–1.2)
Total Protein: 7.4 g/dL (ref 6.1–8.1)

## 2024-02-28 LAB — SEDIMENTATION RATE: Sed Rate: 17 mm/h — ABNORMAL HIGH (ref 0–15)

## 2024-02-28 LAB — C-REACTIVE PROTEIN: CRP: 4.3 mg/L (ref <8.0)

## 2024-02-29 ENCOUNTER — Telehealth: Payer: Self-pay | Admitting: Pharmacist

## 2024-02-29 DIAGNOSIS — T8140XA Infection following a procedure, unspecified, initial encounter: Secondary | ICD-10-CM | POA: Diagnosis not present

## 2024-02-29 DIAGNOSIS — S91302A Unspecified open wound, left foot, initial encounter: Secondary | ICD-10-CM | POA: Diagnosis not present

## 2024-02-29 NOTE — Progress Notes (Signed)
   02/29/2024  Patient ID: Dakota Becker, male   DOB: 1976/05/22, 48 y.o.   MRN: [id number]  Patient was identified for DM counseling based on A1c greater than 8% on last lab results.   Tried calling patient today to schedule free DM initial visit me as the pharmacist, with all changes made in collaboration with the PCP. Left HIPAA compliant voicemail requesting call back at earliest convenience to schedule. If patient returns call to office, you can transfer them to my line at [phone]. Thank you!  Attempt #1 (out of 3)    Dakota Becker, PharmD [facility]  Phone Number: [phone]

## 2024-03-01 DIAGNOSIS — L89892 Pressure ulcer of other site, stage 2: Secondary | ICD-10-CM | POA: Diagnosis not present

## 2024-03-01 DIAGNOSIS — S91302A Unspecified open wound, left foot, initial encounter: Secondary | ICD-10-CM | POA: Diagnosis not present

## 2024-03-01 DIAGNOSIS — F1090 Alcohol use, unspecified, uncomplicated: Secondary | ICD-10-CM | POA: Diagnosis not present

## 2024-03-01 DIAGNOSIS — E785 Hyperlipidemia, unspecified: Secondary | ICD-10-CM | POA: Diagnosis not present

## 2024-03-01 DIAGNOSIS — Z6828 Body mass index (BMI) 28.0-28.9, adult: Secondary | ICD-10-CM | POA: Diagnosis not present

## 2024-03-01 DIAGNOSIS — I10 Essential (primary) hypertension: Secondary | ICD-10-CM | POA: Diagnosis not present

## 2024-03-01 DIAGNOSIS — E1151 Type 2 diabetes mellitus with diabetic peripheral angiopathy without gangrene: Secondary | ICD-10-CM | POA: Diagnosis not present

## 2024-03-01 DIAGNOSIS — Z4889 Encounter for other specified surgical aftercare: Secondary | ICD-10-CM | POA: Diagnosis not present

## 2024-03-01 DIAGNOSIS — M869 Osteomyelitis, unspecified: Secondary | ICD-10-CM | POA: Diagnosis not present

## 2024-03-01 DIAGNOSIS — Z7901 Long term (current) use of anticoagulants: Secondary | ICD-10-CM | POA: Diagnosis not present

## 2024-03-01 DIAGNOSIS — Z794 Long term (current) use of insulin: Secondary | ICD-10-CM | POA: Diagnosis not present

## 2024-03-01 DIAGNOSIS — I82402 Acute embolism and thrombosis of unspecified deep veins of left lower extremity: Secondary | ICD-10-CM | POA: Diagnosis not present

## 2024-03-01 DIAGNOSIS — E1169 Type 2 diabetes mellitus with other specified complication: Secondary | ICD-10-CM | POA: Diagnosis not present

## 2024-03-01 DIAGNOSIS — A409 Streptococcal sepsis, unspecified: Secondary | ICD-10-CM | POA: Diagnosis not present

## 2024-03-01 DIAGNOSIS — K5900 Constipation, unspecified: Secondary | ICD-10-CM | POA: Diagnosis not present

## 2024-03-01 DIAGNOSIS — E669 Obesity, unspecified: Secondary | ICD-10-CM | POA: Diagnosis not present

## 2024-03-01 DIAGNOSIS — T8140XA Infection following a procedure, unspecified, initial encounter: Secondary | ICD-10-CM | POA: Diagnosis not present

## 2024-03-02 DIAGNOSIS — T8140XA Infection following a procedure, unspecified, initial encounter: Secondary | ICD-10-CM | POA: Diagnosis not present

## 2024-03-02 DIAGNOSIS — S91302A Unspecified open wound, left foot, initial encounter: Secondary | ICD-10-CM | POA: Diagnosis not present

## 2024-03-03 DIAGNOSIS — S91302A Unspecified open wound, left foot, initial encounter: Secondary | ICD-10-CM | POA: Diagnosis not present

## 2024-03-03 DIAGNOSIS — T8140XA Infection following a procedure, unspecified, initial encounter: Secondary | ICD-10-CM | POA: Diagnosis not present

## 2024-03-04 DIAGNOSIS — T8140XA Infection following a procedure, unspecified, initial encounter: Secondary | ICD-10-CM | POA: Diagnosis not present

## 2024-03-04 DIAGNOSIS — S91302A Unspecified open wound, left foot, initial encounter: Secondary | ICD-10-CM | POA: Diagnosis not present

## 2024-03-05 ENCOUNTER — Ambulatory Visit: Admitting: Podiatry

## 2024-03-05 DIAGNOSIS — E1169 Type 2 diabetes mellitus with other specified complication: Secondary | ICD-10-CM | POA: Diagnosis not present

## 2024-03-05 DIAGNOSIS — L89892 Pressure ulcer of other site, stage 2: Secondary | ICD-10-CM | POA: Diagnosis not present

## 2024-03-05 DIAGNOSIS — A409 Streptococcal sepsis, unspecified: Secondary | ICD-10-CM | POA: Diagnosis not present

## 2024-03-05 DIAGNOSIS — E782 Mixed hyperlipidemia: Secondary | ICD-10-CM | POA: Diagnosis not present

## 2024-03-05 DIAGNOSIS — M869 Osteomyelitis, unspecified: Secondary | ICD-10-CM | POA: Diagnosis not present

## 2024-03-05 DIAGNOSIS — T8140XA Infection following a procedure, unspecified, initial encounter: Secondary | ICD-10-CM | POA: Diagnosis not present

## 2024-03-05 DIAGNOSIS — I10 Essential (primary) hypertension: Secondary | ICD-10-CM | POA: Diagnosis not present

## 2024-03-05 DIAGNOSIS — Z794 Long term (current) use of insulin: Secondary | ICD-10-CM | POA: Diagnosis not present

## 2024-03-05 DIAGNOSIS — K5900 Constipation, unspecified: Secondary | ICD-10-CM | POA: Diagnosis not present

## 2024-03-05 DIAGNOSIS — Z9889 Other specified postprocedural states: Secondary | ICD-10-CM

## 2024-03-05 DIAGNOSIS — A480 Gas gangrene: Secondary | ICD-10-CM

## 2024-03-05 DIAGNOSIS — E0843 Diabetes mellitus due to underlying condition with diabetic autonomic (poly)neuropathy: Secondary | ICD-10-CM

## 2024-03-05 DIAGNOSIS — E1151 Type 2 diabetes mellitus with diabetic peripheral angiopathy without gangrene: Secondary | ICD-10-CM | POA: Diagnosis not present

## 2024-03-05 DIAGNOSIS — Z6828 Body mass index (BMI) 28.0-28.9, adult: Secondary | ICD-10-CM | POA: Diagnosis not present

## 2024-03-05 DIAGNOSIS — F1090 Alcohol use, unspecified, uncomplicated: Secondary | ICD-10-CM | POA: Diagnosis not present

## 2024-03-05 DIAGNOSIS — S91302A Unspecified open wound, left foot, initial encounter: Secondary | ICD-10-CM | POA: Diagnosis not present

## 2024-03-05 DIAGNOSIS — I82402 Acute embolism and thrombosis of unspecified deep veins of left lower extremity: Secondary | ICD-10-CM | POA: Diagnosis not present

## 2024-03-05 DIAGNOSIS — Z4889 Encounter for other specified surgical aftercare: Secondary | ICD-10-CM | POA: Diagnosis not present

## 2024-03-05 DIAGNOSIS — E669 Obesity, unspecified: Secondary | ICD-10-CM | POA: Diagnosis not present

## 2024-03-05 DIAGNOSIS — Z7901 Long term (current) use of anticoagulants: Secondary | ICD-10-CM | POA: Diagnosis not present

## 2024-03-05 DIAGNOSIS — E785 Hyperlipidemia, unspecified: Secondary | ICD-10-CM | POA: Diagnosis not present

## 2024-03-05 NOTE — Progress Notes (Signed)
 Subjective:  Patient ID: Dakota Becker, male    DOB: 03-26-1976,  MRN: [id number]  Chief Complaint  Patient presents with   Routine Post Op    F/U gangrene of left foot removed wound vac     DOS: 01/27/24 Proc: 1. Partial first ray amputation of left foot 2. 2nd toe amputation at [facility] level of left foot 3. Application dissolvable antibiotic beads left foot  DOS: 01/29/24 1.  Repeat irrigation and debridement partial second metatarsal resection, left foot 2.  Dermal allograft application 5 x 4 cm, left foot 3.  Application negative pressure wound therapy 5 x 4 cm, left foot  48 y.o. male seen for post op check.  Now approximately 5 weeks out from above procedures.  Denies pain says wound VAC is being changed twice weekly for home health as instructed.  Swelling is decreased not much drainage.  Taking antibiotics p.o. as prescribed by infectious disease.  Doing minimal weightbearing to the heel in the postop shoe to get around his house otherwise staying off his foot as instructed.  Review of Systems: Negative except as noted in the HPI. Denies N/V/F/Ch.   Objective:   Constitutional Well developed. Well nourished.  Vascular Foot warm and well perfused. Capillary refill normal to all digits.   No calf pain with palpation  Neurologic Normal speech. Oriented to person, place, and time. Epicritic sensation absent to left foot  Dermatologic The area of open wound at the dorsal medial aspect of the left foot appears to be healthy with granular and fibrotic tissues present in wound bed.  Upon debridement of some overlying fibrotic tissue there is healthy granular bleeding tissue underlying without evidence of residual infection.  Wound measures approximately 4 x 4 cm.  Some maceration of the tissue margins.  Overall appears viable and healthy at this time, improved from prior.    Orthopedic: Status post partial 1st and 2nd ray amputation   Radiographs: 01/29/2024 repeat amputation of the  first ray and out of the proximal metatarsal shaft and repeat amputation of the second ray and out of the proximal mid metatarsal shaft.  Pathology: 01/27/2024 A. FOOT, PARTIAL LEFT FIRST RAY AND 2ND TOE, AMPUTATION:  - Benign bone with mild acute inflammation and bacterial colonies  - Margin shows acute inflammation and necrosis, and appears partially  viable  - No malignancy identified   Micro: 01/27/2024 Enterococcus faecalis and group F strep  Assessment:   48 y.o. male with PMHx significant for DM2 with gas gangrene and osteomyelitis of left hallux and gangrene of 2nd toe with concern for underlying osteomyelitis   Now s/p partial 1st and 2nd ray amputation with graft and wound vac application.  Plan:  Patient was evaluated and treated and all questions answered.  5 weeks s/p above procedure -Progressing As expected postoperatively- continue wound VAC therapy twice weekly -Debrided the wound of any necrotic or fibrotic tissues present in wound bed at this time to healthy bleeding base and new collagen dressing followed by 4 x 4 Kerlix Ace was applied -okay to VAC over the collagen and this was discussed with the patient. -XR: Expected postop changes -WB Status: Heel weightbearing in postop shoe  -Sutures: None present -Medications/ABX: Continue antibiotics per ID recommendations currently on long-term antibiotic therapy, recommend at least 2 more weeks antibiotics -Dressing: Continue twice weekly wound VAC application. -Discussed with patient we will can allow for continued wound VAC therapy for infill of the wound and eventual plan for OR debridement and possible  grafting. - F/u Plan: Follow-up in 2 weeks        Maridee Shoemaker, DPM [facility] / [facility]

## 2024-03-06 DIAGNOSIS — S91302A Unspecified open wound, left foot, initial encounter: Secondary | ICD-10-CM | POA: Diagnosis not present

## 2024-03-06 DIAGNOSIS — T8140XA Infection following a procedure, unspecified, initial encounter: Secondary | ICD-10-CM | POA: Diagnosis not present

## 2024-03-07 DIAGNOSIS — K219 Gastro-esophageal reflux disease without esophagitis: Secondary | ICD-10-CM | POA: Diagnosis not present

## 2024-03-07 DIAGNOSIS — S91302A Unspecified open wound, left foot, initial encounter: Secondary | ICD-10-CM | POA: Diagnosis not present

## 2024-03-07 DIAGNOSIS — E782 Mixed hyperlipidemia: Secondary | ICD-10-CM | POA: Diagnosis not present

## 2024-03-07 DIAGNOSIS — T8140XA Infection following a procedure, unspecified, initial encounter: Secondary | ICD-10-CM | POA: Diagnosis not present

## 2024-03-07 DIAGNOSIS — D473 Essential (hemorrhagic) thrombocythemia: Secondary | ICD-10-CM | POA: Diagnosis not present

## 2024-03-07 DIAGNOSIS — I10 Essential (primary) hypertension: Secondary | ICD-10-CM | POA: Diagnosis not present

## 2024-03-08 DIAGNOSIS — I10 Essential (primary) hypertension: Secondary | ICD-10-CM | POA: Diagnosis not present

## 2024-03-08 DIAGNOSIS — E785 Hyperlipidemia, unspecified: Secondary | ICD-10-CM | POA: Diagnosis not present

## 2024-03-08 DIAGNOSIS — F1090 Alcohol use, unspecified, uncomplicated: Secondary | ICD-10-CM | POA: Diagnosis not present

## 2024-03-08 DIAGNOSIS — E1169 Type 2 diabetes mellitus with other specified complication: Secondary | ICD-10-CM | POA: Diagnosis not present

## 2024-03-08 DIAGNOSIS — M869 Osteomyelitis, unspecified: Secondary | ICD-10-CM | POA: Diagnosis not present

## 2024-03-08 DIAGNOSIS — Z6828 Body mass index (BMI) 28.0-28.9, adult: Secondary | ICD-10-CM | POA: Diagnosis not present

## 2024-03-08 DIAGNOSIS — S91302A Unspecified open wound, left foot, initial encounter: Secondary | ICD-10-CM | POA: Diagnosis not present

## 2024-03-08 DIAGNOSIS — K5900 Constipation, unspecified: Secondary | ICD-10-CM | POA: Diagnosis not present

## 2024-03-08 DIAGNOSIS — Z794 Long term (current) use of insulin: Secondary | ICD-10-CM | POA: Diagnosis not present

## 2024-03-08 DIAGNOSIS — T8140XA Infection following a procedure, unspecified, initial encounter: Secondary | ICD-10-CM | POA: Diagnosis not present

## 2024-03-08 DIAGNOSIS — E1151 Type 2 diabetes mellitus with diabetic peripheral angiopathy without gangrene: Secondary | ICD-10-CM | POA: Diagnosis not present

## 2024-03-08 DIAGNOSIS — Z7901 Long term (current) use of anticoagulants: Secondary | ICD-10-CM | POA: Diagnosis not present

## 2024-03-08 DIAGNOSIS — E669 Obesity, unspecified: Secondary | ICD-10-CM | POA: Diagnosis not present

## 2024-03-08 DIAGNOSIS — A409 Streptococcal sepsis, unspecified: Secondary | ICD-10-CM | POA: Diagnosis not present

## 2024-03-08 DIAGNOSIS — I82402 Acute embolism and thrombosis of unspecified deep veins of left lower extremity: Secondary | ICD-10-CM | POA: Diagnosis not present

## 2024-03-08 DIAGNOSIS — L89892 Pressure ulcer of other site, stage 2: Secondary | ICD-10-CM | POA: Diagnosis not present

## 2024-03-08 DIAGNOSIS — Z4889 Encounter for other specified surgical aftercare: Secondary | ICD-10-CM | POA: Diagnosis not present

## 2024-03-09 DIAGNOSIS — I10 Essential (primary) hypertension: Secondary | ICD-10-CM | POA: Diagnosis not present

## 2024-03-09 DIAGNOSIS — K5900 Constipation, unspecified: Secondary | ICD-10-CM | POA: Diagnosis not present

## 2024-03-09 DIAGNOSIS — L89892 Pressure ulcer of other site, stage 2: Secondary | ICD-10-CM | POA: Diagnosis not present

## 2024-03-09 DIAGNOSIS — S91302A Unspecified open wound, left foot, initial encounter: Secondary | ICD-10-CM | POA: Diagnosis not present

## 2024-03-09 DIAGNOSIS — Z7901 Long term (current) use of anticoagulants: Secondary | ICD-10-CM | POA: Diagnosis not present

## 2024-03-09 DIAGNOSIS — E1151 Type 2 diabetes mellitus with diabetic peripheral angiopathy without gangrene: Secondary | ICD-10-CM | POA: Diagnosis not present

## 2024-03-09 DIAGNOSIS — E669 Obesity, unspecified: Secondary | ICD-10-CM | POA: Diagnosis not present

## 2024-03-09 DIAGNOSIS — Z4889 Encounter for other specified surgical aftercare: Secondary | ICD-10-CM | POA: Diagnosis not present

## 2024-03-09 DIAGNOSIS — Z6828 Body mass index (BMI) 28.0-28.9, adult: Secondary | ICD-10-CM | POA: Diagnosis not present

## 2024-03-09 DIAGNOSIS — M869 Osteomyelitis, unspecified: Secondary | ICD-10-CM | POA: Diagnosis not present

## 2024-03-09 DIAGNOSIS — T8140XA Infection following a procedure, unspecified, initial encounter: Secondary | ICD-10-CM | POA: Diagnosis not present

## 2024-03-09 DIAGNOSIS — Z794 Long term (current) use of insulin: Secondary | ICD-10-CM | POA: Diagnosis not present

## 2024-03-09 DIAGNOSIS — A409 Streptococcal sepsis, unspecified: Secondary | ICD-10-CM | POA: Diagnosis not present

## 2024-03-09 DIAGNOSIS — I82402 Acute embolism and thrombosis of unspecified deep veins of left lower extremity: Secondary | ICD-10-CM | POA: Diagnosis not present

## 2024-03-09 DIAGNOSIS — F1090 Alcohol use, unspecified, uncomplicated: Secondary | ICD-10-CM | POA: Diagnosis not present

## 2024-03-09 DIAGNOSIS — E785 Hyperlipidemia, unspecified: Secondary | ICD-10-CM | POA: Diagnosis not present

## 2024-03-09 DIAGNOSIS — E1169 Type 2 diabetes mellitus with other specified complication: Secondary | ICD-10-CM | POA: Diagnosis not present

## 2024-03-10 DIAGNOSIS — S91302A Unspecified open wound, left foot, initial encounter: Secondary | ICD-10-CM | POA: Diagnosis not present

## 2024-03-10 DIAGNOSIS — T8140XA Infection following a procedure, unspecified, initial encounter: Secondary | ICD-10-CM | POA: Diagnosis not present

## 2024-03-11 DIAGNOSIS — T8140XA Infection following a procedure, unspecified, initial encounter: Secondary | ICD-10-CM | POA: Diagnosis not present

## 2024-03-11 DIAGNOSIS — S91302A Unspecified open wound, left foot, initial encounter: Secondary | ICD-10-CM | POA: Diagnosis not present

## 2024-03-12 DIAGNOSIS — E785 Hyperlipidemia, unspecified: Secondary | ICD-10-CM | POA: Diagnosis not present

## 2024-03-12 DIAGNOSIS — T8140XA Infection following a procedure, unspecified, initial encounter: Secondary | ICD-10-CM | POA: Diagnosis not present

## 2024-03-12 DIAGNOSIS — Z4889 Encounter for other specified surgical aftercare: Secondary | ICD-10-CM | POA: Diagnosis not present

## 2024-03-12 DIAGNOSIS — E669 Obesity, unspecified: Secondary | ICD-10-CM | POA: Diagnosis not present

## 2024-03-12 DIAGNOSIS — Z7901 Long term (current) use of anticoagulants: Secondary | ICD-10-CM | POA: Diagnosis not present

## 2024-03-12 DIAGNOSIS — L89892 Pressure ulcer of other site, stage 2: Secondary | ICD-10-CM | POA: Diagnosis not present

## 2024-03-12 DIAGNOSIS — K5900 Constipation, unspecified: Secondary | ICD-10-CM | POA: Diagnosis not present

## 2024-03-12 DIAGNOSIS — Z6828 Body mass index (BMI) 28.0-28.9, adult: Secondary | ICD-10-CM | POA: Diagnosis not present

## 2024-03-12 DIAGNOSIS — I82402 Acute embolism and thrombosis of unspecified deep veins of left lower extremity: Secondary | ICD-10-CM | POA: Diagnosis not present

## 2024-03-12 DIAGNOSIS — F1090 Alcohol use, unspecified, uncomplicated: Secondary | ICD-10-CM | POA: Diagnosis not present

## 2024-03-12 DIAGNOSIS — E1169 Type 2 diabetes mellitus with other specified complication: Secondary | ICD-10-CM | POA: Diagnosis not present

## 2024-03-12 DIAGNOSIS — M869 Osteomyelitis, unspecified: Secondary | ICD-10-CM | POA: Diagnosis not present

## 2024-03-12 DIAGNOSIS — A409 Streptococcal sepsis, unspecified: Secondary | ICD-10-CM | POA: Diagnosis not present

## 2024-03-12 DIAGNOSIS — I10 Essential (primary) hypertension: Secondary | ICD-10-CM | POA: Diagnosis not present

## 2024-03-12 DIAGNOSIS — Z794 Long term (current) use of insulin: Secondary | ICD-10-CM | POA: Diagnosis not present

## 2024-03-12 DIAGNOSIS — S91302A Unspecified open wound, left foot, initial encounter: Secondary | ICD-10-CM | POA: Diagnosis not present

## 2024-03-12 DIAGNOSIS — E1151 Type 2 diabetes mellitus with diabetic peripheral angiopathy without gangrene: Secondary | ICD-10-CM | POA: Diagnosis not present

## 2024-03-13 DIAGNOSIS — Z7901 Long term (current) use of anticoagulants: Secondary | ICD-10-CM | POA: Diagnosis not present

## 2024-03-13 DIAGNOSIS — A409 Streptococcal sepsis, unspecified: Secondary | ICD-10-CM | POA: Diagnosis not present

## 2024-03-13 DIAGNOSIS — F1090 Alcohol use, unspecified, uncomplicated: Secondary | ICD-10-CM | POA: Diagnosis not present

## 2024-03-13 DIAGNOSIS — L89892 Pressure ulcer of other site, stage 2: Secondary | ICD-10-CM | POA: Diagnosis not present

## 2024-03-13 DIAGNOSIS — Z6828 Body mass index (BMI) 28.0-28.9, adult: Secondary | ICD-10-CM | POA: Diagnosis not present

## 2024-03-13 DIAGNOSIS — K5900 Constipation, unspecified: Secondary | ICD-10-CM | POA: Diagnosis not present

## 2024-03-13 DIAGNOSIS — E785 Hyperlipidemia, unspecified: Secondary | ICD-10-CM | POA: Diagnosis not present

## 2024-03-13 DIAGNOSIS — E1169 Type 2 diabetes mellitus with other specified complication: Secondary | ICD-10-CM | POA: Diagnosis not present

## 2024-03-13 DIAGNOSIS — Z4889 Encounter for other specified surgical aftercare: Secondary | ICD-10-CM | POA: Diagnosis not present

## 2024-03-13 DIAGNOSIS — E1151 Type 2 diabetes mellitus with diabetic peripheral angiopathy without gangrene: Secondary | ICD-10-CM | POA: Diagnosis not present

## 2024-03-13 DIAGNOSIS — I10 Essential (primary) hypertension: Secondary | ICD-10-CM | POA: Diagnosis not present

## 2024-03-13 DIAGNOSIS — S91302A Unspecified open wound, left foot, initial encounter: Secondary | ICD-10-CM | POA: Diagnosis not present

## 2024-03-13 DIAGNOSIS — I82402 Acute embolism and thrombosis of unspecified deep veins of left lower extremity: Secondary | ICD-10-CM | POA: Diagnosis not present

## 2024-03-13 DIAGNOSIS — M869 Osteomyelitis, unspecified: Secondary | ICD-10-CM | POA: Diagnosis not present

## 2024-03-13 DIAGNOSIS — Z794 Long term (current) use of insulin: Secondary | ICD-10-CM | POA: Diagnosis not present

## 2024-03-13 DIAGNOSIS — T8140XA Infection following a procedure, unspecified, initial encounter: Secondary | ICD-10-CM | POA: Diagnosis not present

## 2024-03-13 DIAGNOSIS — E669 Obesity, unspecified: Secondary | ICD-10-CM | POA: Diagnosis not present

## 2024-03-14 ENCOUNTER — Telehealth: Payer: Self-pay | Admitting: Podiatry

## 2024-03-14 DIAGNOSIS — S91302A Unspecified open wound, left foot, initial encounter: Secondary | ICD-10-CM | POA: Diagnosis not present

## 2024-03-14 DIAGNOSIS — T8140XA Infection following a procedure, unspecified, initial encounter: Secondary | ICD-10-CM | POA: Diagnosis not present

## 2024-03-14 NOTE — Telephone Encounter (Signed)
 Patient called in refernce to wound vac- When it was placed the area from L3-L5 was wrapped with gauze by whomever was placing the wound vac. Patient relayed Dr.Standifords instructions to avoid the amputation site. Somehow the wound vac ended up on the site of amputation and patient was experiencing some pain for a couple of days but wasn't until yesterday 6/18 that patient noticed a pulling sensation and when he looked he says that the incision site appeared to have split open. Patient wants to know if there is anything he should do before next appointment on 6/24. Please advise.

## 2024-03-15 DIAGNOSIS — F1090 Alcohol use, unspecified, uncomplicated: Secondary | ICD-10-CM | POA: Diagnosis not present

## 2024-03-15 DIAGNOSIS — I10 Essential (primary) hypertension: Secondary | ICD-10-CM | POA: Diagnosis not present

## 2024-03-15 DIAGNOSIS — E1151 Type 2 diabetes mellitus with diabetic peripheral angiopathy without gangrene: Secondary | ICD-10-CM | POA: Diagnosis not present

## 2024-03-15 DIAGNOSIS — E1169 Type 2 diabetes mellitus with other specified complication: Secondary | ICD-10-CM | POA: Diagnosis not present

## 2024-03-15 DIAGNOSIS — Z7901 Long term (current) use of anticoagulants: Secondary | ICD-10-CM | POA: Diagnosis not present

## 2024-03-15 DIAGNOSIS — A409 Streptococcal sepsis, unspecified: Secondary | ICD-10-CM | POA: Diagnosis not present

## 2024-03-15 DIAGNOSIS — I82402 Acute embolism and thrombosis of unspecified deep veins of left lower extremity: Secondary | ICD-10-CM | POA: Diagnosis not present

## 2024-03-15 DIAGNOSIS — T8140XA Infection following a procedure, unspecified, initial encounter: Secondary | ICD-10-CM | POA: Diagnosis not present

## 2024-03-15 DIAGNOSIS — L89892 Pressure ulcer of other site, stage 2: Secondary | ICD-10-CM | POA: Diagnosis not present

## 2024-03-15 DIAGNOSIS — K5900 Constipation, unspecified: Secondary | ICD-10-CM | POA: Diagnosis not present

## 2024-03-15 DIAGNOSIS — S91302A Unspecified open wound, left foot, initial encounter: Secondary | ICD-10-CM | POA: Diagnosis not present

## 2024-03-15 DIAGNOSIS — E669 Obesity, unspecified: Secondary | ICD-10-CM | POA: Diagnosis not present

## 2024-03-15 DIAGNOSIS — Z6828 Body mass index (BMI) 28.0-28.9, adult: Secondary | ICD-10-CM | POA: Diagnosis not present

## 2024-03-15 DIAGNOSIS — Z794 Long term (current) use of insulin: Secondary | ICD-10-CM | POA: Diagnosis not present

## 2024-03-15 DIAGNOSIS — Z4889 Encounter for other specified surgical aftercare: Secondary | ICD-10-CM | POA: Diagnosis not present

## 2024-03-15 DIAGNOSIS — M869 Osteomyelitis, unspecified: Secondary | ICD-10-CM | POA: Diagnosis not present

## 2024-03-15 DIAGNOSIS — E785 Hyperlipidemia, unspecified: Secondary | ICD-10-CM | POA: Diagnosis not present

## 2024-03-15 NOTE — Telephone Encounter (Signed)
 Patient informed of provider instructions. Will call back if needed.

## 2024-03-16 DIAGNOSIS — S91302A Unspecified open wound, left foot, initial encounter: Secondary | ICD-10-CM | POA: Diagnosis not present

## 2024-03-16 DIAGNOSIS — T8140XA Infection following a procedure, unspecified, initial encounter: Secondary | ICD-10-CM | POA: Diagnosis not present

## 2024-03-17 DIAGNOSIS — S91302A Unspecified open wound, left foot, initial encounter: Secondary | ICD-10-CM | POA: Diagnosis not present

## 2024-03-17 DIAGNOSIS — T8140XA Infection following a procedure, unspecified, initial encounter: Secondary | ICD-10-CM | POA: Diagnosis not present

## 2024-03-18 DIAGNOSIS — S91302A Unspecified open wound, left foot, initial encounter: Secondary | ICD-10-CM | POA: Diagnosis not present

## 2024-03-18 DIAGNOSIS — T8140XA Infection following a procedure, unspecified, initial encounter: Secondary | ICD-10-CM | POA: Diagnosis not present

## 2024-03-18 NOTE — Telephone Encounter (Signed)
Patient informed of provider recommendations.

## 2024-03-18 NOTE — Telephone Encounter (Signed)
 Pt is now having more intense pain. Please advise as to anything to be done prior to appointment tomorrow.

## 2024-03-19 ENCOUNTER — Encounter: Payer: Self-pay | Admitting: Podiatry

## 2024-03-19 ENCOUNTER — Telehealth: Payer: Self-pay

## 2024-03-19 ENCOUNTER — Ambulatory Visit (INDEPENDENT_AMBULATORY_CARE_PROVIDER_SITE_OTHER): Admitting: Podiatry

## 2024-03-19 DIAGNOSIS — Z9889 Other specified postprocedural states: Secondary | ICD-10-CM

## 2024-03-19 DIAGNOSIS — S91302A Unspecified open wound, left foot, initial encounter: Secondary | ICD-10-CM | POA: Diagnosis not present

## 2024-03-19 DIAGNOSIS — E0843 Diabetes mellitus due to underlying condition with diabetic autonomic (poly)neuropathy: Secondary | ICD-10-CM

## 2024-03-19 DIAGNOSIS — T8140XA Infection following a procedure, unspecified, initial encounter: Secondary | ICD-10-CM | POA: Diagnosis not present

## 2024-03-19 DIAGNOSIS — A480 Gas gangrene: Secondary | ICD-10-CM

## 2024-03-19 NOTE — Progress Notes (Signed)
 Subjective:  Patient ID: Dakota Becker, male    DOB: [birth year],  MRN: [id number]  Chief Complaint  Patient presents with   Routine Post Op    Rm22/2 week Post- op with wound vac left foot/patient says that he is having sharp shooting pains    DOS: 01/27/24 Proc: 1. Partial first ray amputation of left foot 2. 2nd toe amputation at [facility] level of left foot 3. Application dissolvable antibiotic beads left foot  DOS: 01/29/24 1.  Repeat irrigation and debridement partial second metatarsal resection, left foot 2.  Dermal allograft application 5 x 4 cm, left foot 3.  Application negative pressure wound therapy 5 x 4 cm, left foot  48 y.o. male seen for post op check.  Now approximately 7 weeks out from above procedures.    He reports there was some issues with the wound VAC obtaining appropriate seal and not covering the distal aspect of the amputation site.  Concern for small opening of the wound at the area.  He has been putting mupirocin  ointment on the area.  Getting wound VAC changes twice weekly.  Walking in postop shoe saw antibiotics per infectious disease has follow-up with them June 30.  Review of Systems: Negative except as noted in the HPI. Denies N/V/F/Ch.   Objective:   Constitutional Well developed. Well nourished.  Vascular Foot warm and well perfused. Capillary refill normal to all digits.   No calf pain with palpation  Neurologic Normal speech. Oriented to person, place, and time. Epicritic sensation absent to left foot  Dermatologic The area of open wound at the dorsal medial aspect of the left foot appears to be healthy with granular and minimal fibrotic tissues present in wound bed.   Wound measures approximately 3.5 x 3.5 cm.  Decreased maceration overall healthy granular tissue bed and fill improved from prior   Orthopedic: Status post partial 1st and 2nd ray amputation   Radiographs: 01/29/2024 repeat amputation of the first ray and out of the proximal metatarsal  shaft and repeat amputation of the second ray and out of the proximal mid metatarsal shaft.  Pathology: 01/27/2024 A. FOOT, PARTIAL LEFT FIRST RAY AND 2ND TOE, AMPUTATION:  - Benign bone with mild acute inflammation and bacterial colonies  - Margin shows acute inflammation and necrosis, and appears partially  viable  - No malignancy identified   Micro: 01/27/2024 Enterococcus faecalis and group F strep  Assessment:   48 y.o. male with PMHx significant for DM2 with gas gangrene and osteomyelitis of left hallux and gangrene of 2nd toe with concern for underlying osteomyelitis   Now s/p partial 1st and 2nd ray amputation with graft and wound vac application.  Plan:  Patient was evaluated and treated and all questions answered.  7 weeks s/p above procedure -Progressing As expected postoperatively- continue wound VAC therapy twice weekly -Debrided the wound of any necrotic or fibrotic tissues present in wound bed at this time to healthy bleeding base and new collagen dressing followed by 4 x 4 Kerlix Ace was applied  -XR: Expected postop changes -WB Status: Heel weightbearing in postop shoe  -Sutures: None present -Medications/ABX: Continue antibiotics per ID recommendations currently on long-term antibiotic therapy, okay to come off antibiotics if felt appropriate by ID -Dressing: Continue twice weekly wound VAC application.  Applied a collagen dressing today wound VAC will be replaced tomorrow - Continues to improve and appears healthy.  Will consider additional debridement in the OR and grafting in the future  -Continue gabapentin  as  needed for nerve pain.  May need to increase gabapentin  - F/u Plan: Follow-up in 2 weeks        Dakota Becker, DPM [facility] / [facility]

## 2024-03-19 NOTE — Telephone Encounter (Signed)
 Per Dr. Malvin verbal order patient instructed to increase gabapentin  300 mg three times daily to 600 mg at night. Patient informed and verbalized understanding.

## 2024-03-20 DIAGNOSIS — Z4889 Encounter for other specified surgical aftercare: Secondary | ICD-10-CM | POA: Diagnosis not present

## 2024-03-20 DIAGNOSIS — F1090 Alcohol use, unspecified, uncomplicated: Secondary | ICD-10-CM | POA: Diagnosis not present

## 2024-03-20 DIAGNOSIS — K5900 Constipation, unspecified: Secondary | ICD-10-CM | POA: Diagnosis not present

## 2024-03-20 DIAGNOSIS — Z794 Long term (current) use of insulin: Secondary | ICD-10-CM | POA: Diagnosis not present

## 2024-03-20 DIAGNOSIS — S91302A Unspecified open wound, left foot, initial encounter: Secondary | ICD-10-CM | POA: Diagnosis not present

## 2024-03-20 DIAGNOSIS — E669 Obesity, unspecified: Secondary | ICD-10-CM | POA: Diagnosis not present

## 2024-03-20 DIAGNOSIS — L89892 Pressure ulcer of other site, stage 2: Secondary | ICD-10-CM | POA: Diagnosis not present

## 2024-03-20 DIAGNOSIS — E785 Hyperlipidemia, unspecified: Secondary | ICD-10-CM | POA: Diagnosis not present

## 2024-03-20 DIAGNOSIS — T8140XA Infection following a procedure, unspecified, initial encounter: Secondary | ICD-10-CM | POA: Diagnosis not present

## 2024-03-20 DIAGNOSIS — M869 Osteomyelitis, unspecified: Secondary | ICD-10-CM | POA: Diagnosis not present

## 2024-03-20 DIAGNOSIS — A409 Streptococcal sepsis, unspecified: Secondary | ICD-10-CM | POA: Diagnosis not present

## 2024-03-20 DIAGNOSIS — I10 Essential (primary) hypertension: Secondary | ICD-10-CM | POA: Diagnosis not present

## 2024-03-20 DIAGNOSIS — Z7901 Long term (current) use of anticoagulants: Secondary | ICD-10-CM | POA: Diagnosis not present

## 2024-03-20 DIAGNOSIS — Z6828 Body mass index (BMI) 28.0-28.9, adult: Secondary | ICD-10-CM | POA: Diagnosis not present

## 2024-03-20 DIAGNOSIS — E1169 Type 2 diabetes mellitus with other specified complication: Secondary | ICD-10-CM | POA: Diagnosis not present

## 2024-03-20 DIAGNOSIS — I82402 Acute embolism and thrombosis of unspecified deep veins of left lower extremity: Secondary | ICD-10-CM | POA: Diagnosis not present

## 2024-03-20 DIAGNOSIS — E1151 Type 2 diabetes mellitus with diabetic peripheral angiopathy without gangrene: Secondary | ICD-10-CM | POA: Diagnosis not present

## 2024-03-21 DIAGNOSIS — S91302A Unspecified open wound, left foot, initial encounter: Secondary | ICD-10-CM | POA: Diagnosis not present

## 2024-03-21 DIAGNOSIS — T8140XA Infection following a procedure, unspecified, initial encounter: Secondary | ICD-10-CM | POA: Diagnosis not present

## 2024-03-22 DIAGNOSIS — Z7901 Long term (current) use of anticoagulants: Secondary | ICD-10-CM | POA: Diagnosis not present

## 2024-03-22 DIAGNOSIS — I82402 Acute embolism and thrombosis of unspecified deep veins of left lower extremity: Secondary | ICD-10-CM | POA: Diagnosis not present

## 2024-03-22 DIAGNOSIS — Z794 Long term (current) use of insulin: Secondary | ICD-10-CM | POA: Diagnosis not present

## 2024-03-22 DIAGNOSIS — E1169 Type 2 diabetes mellitus with other specified complication: Secondary | ICD-10-CM | POA: Diagnosis not present

## 2024-03-22 DIAGNOSIS — T8140XA Infection following a procedure, unspecified, initial encounter: Secondary | ICD-10-CM | POA: Diagnosis not present

## 2024-03-22 DIAGNOSIS — I10 Essential (primary) hypertension: Secondary | ICD-10-CM | POA: Diagnosis not present

## 2024-03-22 DIAGNOSIS — S91302A Unspecified open wound, left foot, initial encounter: Secondary | ICD-10-CM | POA: Diagnosis not present

## 2024-03-22 DIAGNOSIS — Z4889 Encounter for other specified surgical aftercare: Secondary | ICD-10-CM | POA: Diagnosis not present

## 2024-03-22 DIAGNOSIS — F1090 Alcohol use, unspecified, uncomplicated: Secondary | ICD-10-CM | POA: Diagnosis not present

## 2024-03-22 DIAGNOSIS — E669 Obesity, unspecified: Secondary | ICD-10-CM | POA: Diagnosis not present

## 2024-03-22 DIAGNOSIS — L89892 Pressure ulcer of other site, stage 2: Secondary | ICD-10-CM | POA: Diagnosis not present

## 2024-03-22 DIAGNOSIS — E785 Hyperlipidemia, unspecified: Secondary | ICD-10-CM | POA: Diagnosis not present

## 2024-03-22 DIAGNOSIS — Z6828 Body mass index (BMI) 28.0-28.9, adult: Secondary | ICD-10-CM | POA: Diagnosis not present

## 2024-03-22 DIAGNOSIS — E1151 Type 2 diabetes mellitus with diabetic peripheral angiopathy without gangrene: Secondary | ICD-10-CM | POA: Diagnosis not present

## 2024-03-22 DIAGNOSIS — A409 Streptococcal sepsis, unspecified: Secondary | ICD-10-CM | POA: Diagnosis not present

## 2024-03-22 DIAGNOSIS — K5900 Constipation, unspecified: Secondary | ICD-10-CM | POA: Diagnosis not present

## 2024-03-22 DIAGNOSIS — M869 Osteomyelitis, unspecified: Secondary | ICD-10-CM | POA: Diagnosis not present

## 2024-03-23 DIAGNOSIS — T8140XA Infection following a procedure, unspecified, initial encounter: Secondary | ICD-10-CM | POA: Diagnosis not present

## 2024-03-23 DIAGNOSIS — S91302A Unspecified open wound, left foot, initial encounter: Secondary | ICD-10-CM | POA: Diagnosis not present

## 2024-03-24 DIAGNOSIS — T8140XA Infection following a procedure, unspecified, initial encounter: Secondary | ICD-10-CM | POA: Diagnosis not present

## 2024-03-24 DIAGNOSIS — S91302A Unspecified open wound, left foot, initial encounter: Secondary | ICD-10-CM | POA: Diagnosis not present

## 2024-03-25 ENCOUNTER — Ambulatory Visit (INDEPENDENT_AMBULATORY_CARE_PROVIDER_SITE_OTHER): Payer: Self-pay | Admitting: Internal Medicine

## 2024-03-25 ENCOUNTER — Encounter: Payer: Self-pay | Admitting: Internal Medicine

## 2024-03-25 ENCOUNTER — Other Ambulatory Visit: Payer: Self-pay

## 2024-03-25 VITALS — BP 101/70 | HR 105 | Temp 97.7°F | Ht 73.0 in | Wt 254.0 lb

## 2024-03-25 DIAGNOSIS — T8140XA Infection following a procedure, unspecified, initial encounter: Secondary | ICD-10-CM | POA: Diagnosis not present

## 2024-03-25 DIAGNOSIS — S91302A Unspecified open wound, left foot, initial encounter: Secondary | ICD-10-CM | POA: Diagnosis not present

## 2024-03-25 DIAGNOSIS — M869 Osteomyelitis, unspecified: Secondary | ICD-10-CM | POA: Diagnosis not present

## 2024-03-25 NOTE — Progress Notes (Unsigned)
 Patient Active Problem List   Diagnosis Date Noted   Nausea 02/04/2024   Bacteremia 02/02/2024   Constipation 01/31/2024   Hypertension 01/31/2024   Acute deep vein thrombosis (DVT) of left lower extremity (HCC) 01/31/2024   Type 2 diabetes mellitus without complication, without long-term current use of insulin  (HCC) 01/31/2024   Toe osteomyelitis, left (HCC) 01/27/2024   Gas gangrene of foot (HCC) 01/27/2024   Gangrene of toe of left foot (HCC) 01/27/2024   Controlled type 2 diabetes mellitus without complication, without long-term current use of insulin  (HCC) 01/27/2024   Hypercalcemia 01/27/2024    Patient's Medications  New Prescriptions   No medications on file  Previous Medications   AMOXICILLIN -CLAVULANATE (AUGMENTIN ) 875-125 MG TABLET    Take 1 tablet by mouth every 12 (twelve) hours.   APIXABAN  (ELIQUIS ) 5 MG TABS TABLET    Take 1 tablet (5 mg total) by mouth 2 (two) times daily.   BLOOD GLUCOSE MONITORING SUPPL (BLOOD GLUCOSE MONITOR SYSTEM) W/DEVICE KIT    Use in the morning, at noon, and at bedtime.   GABAPENTIN  (NEURONTIN ) 300 MG CAPSULE    Take 300 mg by mouth 3 (three) times daily.   INSULIN  ASPART (NOVOLOG ) 100 UNIT/ML FLEXPEN    Inject 10 Units into the skin 3 (three) times daily with meals.   INSULIN  GLARGINE (LANTUS ) 100 UNIT/ML SOLOSTAR PEN    Inject 30 Units into the skin daily.   LISINOPRIL  (ZESTRIL ) 20 MG TABLET    Take 20 mg by mouth daily.   PANTOPRAZOLE  (PROTONIX ) 40 MG TABLET    Take 1 tablet (40 mg total) by mouth daily at 6 (six) AM.   SIMVASTATIN (ZOCOR) 20 MG TABLET    Take 20 mg by mouth daily.  Modified Medications   No medications on file  Discontinued Medications   No medications on file    Subjective: 48 year old male with past medical history of diabetes, hyperlipidemia hypertension presents for hospital follow-up of strep constellatus petrosectomy with left foot gas Klickman/osteomyelitis.  Patient presented for worsening wound  left foot and chills.CT of left foot showed necrotizing fasciitis of great toe, emphysema osteomyelitis or osteonecrosis taken to the OR. Blood cultures grew strep constellatus. He underwent first ray amputation second toe amputation at [facility] with podiatry. Or cultures growing E faecalis, group F strep, Prevotella species.  Taken to the OR again for repeat I&D and VAC placement.  TTE and TEE without vegetation.  Discharged on Augmentin  complete 6 weeks of antibiotics from or EOT 6/15 Today: No issues abx. No missed doses. Seen by podiatry in the interim. Wound vac on .  Review of Systems: Review of Systems  All other systems reviewed and are negative.   Past Medical History:  Diagnosis Date   Diabetes mellitus without complication (HCC)    Hyperlipidemia    Hypertension     Social History   Tobacco Use   Smoking status: Never   Smokeless tobacco: Never  Vaping Use   Vaping status: Never Used  Substance Use Topics   Alcohol use: Not Currently    Alcohol/week: 0.0 - 2.0 standard drinks of alcohol   Drug use: No    Family History  Problem Relation Age of Onset   Diabetes Mother    Diabetes Father     Allergies  Allergen Reactions   Lactose Intolerance (Gi) Other (See Comments)    GI upset, gas    Health Maintenance  Topic Date Due  FOOT EXAM  Never done   OPHTHALMOLOGY EXAM  Never done   Diabetic kidney evaluation - Urine ACR  Never done   Hepatitis C Screening  Never done   DTaP/Tdap/Td (1 - Tdap) Never done   Pneumococcal Vaccine 42-27 Years old (1 of 2 - PCV) Never done   Hepatitis B Vaccines (1 of 3 - 19+ 3-dose series) Never done   Colonoscopy  Never done   COVID-19 Vaccine (5 - 2024-25 season) 05/28/2023   INFLUENZA VACCINE  04/26/2024   HEMOGLOBIN A1C  07/29/2024   Diabetic kidney evaluation - eGFR measurement  02/26/2025   HIV Screening  Completed   HPV VACCINES  Aged Out   Meningococcal B Vaccine  Aged Out    Objective:  Vitals:   03/25/24 1550  BP:  101/70  Pulse: (!) 105  Temp: 97.7 F (36.5 C)  TempSrc: Temporal  SpO2: 96%  Weight: 254 lb (115.2 kg)  Height: 6' 1 (1.854 m)   Body mass index is 33.51 kg/m.  Physical Exam Constitutional:      General: He is not in acute distress.    Appearance: He is normal weight. He is not toxic-appearing.  HENT:     Head: Normocephalic and atraumatic.     Right Ear: External ear normal.     Left Ear: External ear normal.     Nose: No congestion or rhinorrhea.     Mouth/Throat:     Mouth: Mucous membranes are moist.     Pharynx: Oropharynx is clear.  Eyes:     Extraocular Movements: Extraocular movements intact.     Conjunctiva/sclera: Conjunctivae normal.     Pupils: Pupils are equal, round, and reactive to light.  Cardiovascular:     Rate and Rhythm: Normal rate and regular rhythm.     Heart sounds: No murmur heard.    No friction rub. No gallop.  Pulmonary:     Effort: Pulmonary effort is normal.     Breath sounds: Normal breath sounds.  Abdominal:     General: Abdomen is flat. Bowel sounds are normal.     Palpations: Abdomen is soft.  Musculoskeletal:        General: No swelling.     Cervical back: Normal range of motion and neck supple.     Comments: Wound vac on  Skin:    General: Skin is warm and dry.  Neurological:     General: No focal deficit present.     Mental Status: He is oriented to person, place, and time.  Psychiatric:        Mood and Affect: Mood normal.    Lab Results Lab Results  Component Value Date   WBC 7.3 02/27/2024   HGB 13.4 02/27/2024   HCT 42.0 02/27/2024   MCV 89.4 02/27/2024   PLT 407 (H) 02/27/2024    Lab Results  Component Value Date   CREATININE 0.89 02/27/2024   BUN 20 02/27/2024   NA 137 02/27/2024   K 5.2 02/27/2024   CL 101 02/27/2024   CO2 29 02/27/2024    Lab Results  Component Value Date   ALT 9 02/27/2024   AST 10 02/27/2024   ALKPHOS 60 02/08/2024   BILITOT 0.4 02/27/2024    Lab Results  Component Value  Date   CHOL (H) 02/22/2008    231        ATP III CLASSIFICATION:  <200     mg/dL   Desirable  799-760  mg/dL   Borderline  High  >=240    mg/dL   High   HDL 16 (L) 94/70/7990   LDLCALC (H) 02/22/2008    160        Total Cholesterol/HDL:CHD Risk Coronary Heart Disease Risk Table                     Men   Women  1/2 Average Risk   3.4   3.3   TRIG 273 (H) 02/22/2008   CHOLHDL 14.4 02/22/2008   No results found for: LABRPR, RPRTITER No results found for: HIV1RNAQUANT, HIV1RNAVL, CD4TABS   Problem List Items Addressed This Visit   None  Results   Assessment/Plan #Strep constellatus bacteremia secondary to left foot Kastenberg/osteomyelitis #Status post second toe amputation with cultures growing E faecalis, group F strep, Prevotella species - Patient was admitted for worsening left foot wound.  Blood cultures on admission grew strep constellatus.  Or cultures as above.  Workup included TTE and TEE without vegetation.  He was taken back to the OR on 5/5 for another I&D discharged on Augmentin  x 6 weeks eot 6/15(pt still taking) -labs 6/3 stable. Seen by podiatry on 6/10 progressing as expected. May need another debridement - Wound vac still on   Plan: - stop antibiotics -F/u in a month(possbile sugery in a couple weeks). If concern for infeciton please obatin cx and biopsy as it would off of abx.   #Diabetes mellitus - Needs glycemic control for optimal wound healing    Loney Stank, MD [facility] 03/25/2024, 4:00 PM   I have personally spent 41 minutes involved in face-to-face and non-face-to-face activities for this patient on the day of the visit. Professional time spent includes the following activities: Preparing to see the patient (review of tests), Obtaining and/or reviewing separately obtained history (admission/discharge record), Performing a medically appropriate examination and/or evaluation , Ordering  medications/tests/procedures, referring and communicating with other health care professionals, Documenting clinical information in the EMR, Independently interpreting results (not separately reported), Communicating results to the patient/family/caregiver, Counseling and educating the patient/family/caregiver and Care coordination (not separately reported).

## 2024-03-26 DIAGNOSIS — I82402 Acute embolism and thrombosis of unspecified deep veins of left lower extremity: Secondary | ICD-10-CM | POA: Diagnosis not present

## 2024-03-26 DIAGNOSIS — E1169 Type 2 diabetes mellitus with other specified complication: Secondary | ICD-10-CM | POA: Diagnosis not present

## 2024-03-26 DIAGNOSIS — T8140XA Infection following a procedure, unspecified, initial encounter: Secondary | ICD-10-CM | POA: Diagnosis not present

## 2024-03-26 DIAGNOSIS — Z794 Long term (current) use of insulin: Secondary | ICD-10-CM | POA: Diagnosis not present

## 2024-03-26 DIAGNOSIS — Z7901 Long term (current) use of anticoagulants: Secondary | ICD-10-CM | POA: Diagnosis not present

## 2024-03-26 DIAGNOSIS — K5900 Constipation, unspecified: Secondary | ICD-10-CM | POA: Diagnosis not present

## 2024-03-26 DIAGNOSIS — Z6828 Body mass index (BMI) 28.0-28.9, adult: Secondary | ICD-10-CM | POA: Diagnosis not present

## 2024-03-26 DIAGNOSIS — E1151 Type 2 diabetes mellitus with diabetic peripheral angiopathy without gangrene: Secondary | ICD-10-CM | POA: Diagnosis not present

## 2024-03-26 DIAGNOSIS — M869 Osteomyelitis, unspecified: Secondary | ICD-10-CM | POA: Diagnosis not present

## 2024-03-26 DIAGNOSIS — A409 Streptococcal sepsis, unspecified: Secondary | ICD-10-CM | POA: Diagnosis not present

## 2024-03-26 DIAGNOSIS — E669 Obesity, unspecified: Secondary | ICD-10-CM | POA: Diagnosis not present

## 2024-03-26 DIAGNOSIS — Z4889 Encounter for other specified surgical aftercare: Secondary | ICD-10-CM | POA: Diagnosis not present

## 2024-03-26 DIAGNOSIS — L89892 Pressure ulcer of other site, stage 2: Secondary | ICD-10-CM | POA: Diagnosis not present

## 2024-03-26 DIAGNOSIS — F1090 Alcohol use, unspecified, uncomplicated: Secondary | ICD-10-CM | POA: Diagnosis not present

## 2024-03-26 DIAGNOSIS — E785 Hyperlipidemia, unspecified: Secondary | ICD-10-CM | POA: Diagnosis not present

## 2024-03-26 DIAGNOSIS — S91302A Unspecified open wound, left foot, initial encounter: Secondary | ICD-10-CM | POA: Diagnosis not present

## 2024-03-26 DIAGNOSIS — I10 Essential (primary) hypertension: Secondary | ICD-10-CM | POA: Diagnosis not present

## 2024-03-27 DIAGNOSIS — T8140XA Infection following a procedure, unspecified, initial encounter: Secondary | ICD-10-CM | POA: Diagnosis not present

## 2024-03-27 DIAGNOSIS — S91302A Unspecified open wound, left foot, initial encounter: Secondary | ICD-10-CM | POA: Diagnosis not present

## 2024-03-28 DIAGNOSIS — M869 Osteomyelitis, unspecified: Secondary | ICD-10-CM | POA: Diagnosis not present

## 2024-03-28 DIAGNOSIS — E785 Hyperlipidemia, unspecified: Secondary | ICD-10-CM | POA: Diagnosis not present

## 2024-03-28 DIAGNOSIS — T8140XA Infection following a procedure, unspecified, initial encounter: Secondary | ICD-10-CM | POA: Diagnosis not present

## 2024-03-28 DIAGNOSIS — I10 Essential (primary) hypertension: Secondary | ICD-10-CM | POA: Diagnosis not present

## 2024-03-28 DIAGNOSIS — E669 Obesity, unspecified: Secondary | ICD-10-CM | POA: Diagnosis not present

## 2024-03-28 DIAGNOSIS — Z4889 Encounter for other specified surgical aftercare: Secondary | ICD-10-CM | POA: Diagnosis not present

## 2024-03-28 DIAGNOSIS — Z7901 Long term (current) use of anticoagulants: Secondary | ICD-10-CM | POA: Diagnosis not present

## 2024-03-28 DIAGNOSIS — L89892 Pressure ulcer of other site, stage 2: Secondary | ICD-10-CM | POA: Diagnosis not present

## 2024-03-28 DIAGNOSIS — K5900 Constipation, unspecified: Secondary | ICD-10-CM | POA: Diagnosis not present

## 2024-03-28 DIAGNOSIS — E1169 Type 2 diabetes mellitus with other specified complication: Secondary | ICD-10-CM | POA: Diagnosis not present

## 2024-03-28 DIAGNOSIS — A409 Streptococcal sepsis, unspecified: Secondary | ICD-10-CM | POA: Diagnosis not present

## 2024-03-28 DIAGNOSIS — Z6828 Body mass index (BMI) 28.0-28.9, adult: Secondary | ICD-10-CM | POA: Diagnosis not present

## 2024-03-28 DIAGNOSIS — Z794 Long term (current) use of insulin: Secondary | ICD-10-CM | POA: Diagnosis not present

## 2024-03-28 DIAGNOSIS — S91302A Unspecified open wound, left foot, initial encounter: Secondary | ICD-10-CM | POA: Diagnosis not present

## 2024-03-28 DIAGNOSIS — F1090 Alcohol use, unspecified, uncomplicated: Secondary | ICD-10-CM | POA: Diagnosis not present

## 2024-03-28 DIAGNOSIS — E1151 Type 2 diabetes mellitus with diabetic peripheral angiopathy without gangrene: Secondary | ICD-10-CM | POA: Diagnosis not present

## 2024-03-28 DIAGNOSIS — I82402 Acute embolism and thrombosis of unspecified deep veins of left lower extremity: Secondary | ICD-10-CM | POA: Diagnosis not present

## 2024-03-29 DIAGNOSIS — T8140XA Infection following a procedure, unspecified, initial encounter: Secondary | ICD-10-CM | POA: Diagnosis not present

## 2024-03-29 DIAGNOSIS — S91302A Unspecified open wound, left foot, initial encounter: Secondary | ICD-10-CM | POA: Diagnosis not present

## 2024-03-30 DIAGNOSIS — T8140XA Infection following a procedure, unspecified, initial encounter: Secondary | ICD-10-CM | POA: Diagnosis not present

## 2024-03-30 DIAGNOSIS — S91302A Unspecified open wound, left foot, initial encounter: Secondary | ICD-10-CM | POA: Diagnosis not present

## 2024-03-31 DIAGNOSIS — S91302A Unspecified open wound, left foot, initial encounter: Secondary | ICD-10-CM | POA: Diagnosis not present

## 2024-03-31 DIAGNOSIS — T8140XA Infection following a procedure, unspecified, initial encounter: Secondary | ICD-10-CM | POA: Diagnosis not present

## 2024-04-01 DIAGNOSIS — S91302A Unspecified open wound, left foot, initial encounter: Secondary | ICD-10-CM | POA: Diagnosis not present

## 2024-04-01 DIAGNOSIS — T8140XA Infection following a procedure, unspecified, initial encounter: Secondary | ICD-10-CM | POA: Diagnosis not present

## 2024-04-02 ENCOUNTER — Ambulatory Visit (INDEPENDENT_AMBULATORY_CARE_PROVIDER_SITE_OTHER): Admitting: Podiatry

## 2024-04-02 DIAGNOSIS — T8140XA Infection following a procedure, unspecified, initial encounter: Secondary | ICD-10-CM | POA: Diagnosis not present

## 2024-04-02 DIAGNOSIS — A480 Gas gangrene: Secondary | ICD-10-CM

## 2024-04-02 DIAGNOSIS — E0843 Diabetes mellitus due to underlying condition with diabetic autonomic (poly)neuropathy: Secondary | ICD-10-CM

## 2024-04-02 DIAGNOSIS — S91302A Unspecified open wound, left foot, initial encounter: Secondary | ICD-10-CM | POA: Diagnosis not present

## 2024-04-02 DIAGNOSIS — Z9889 Other specified postprocedural states: Secondary | ICD-10-CM

## 2024-04-02 DIAGNOSIS — L97524 Non-pressure chronic ulcer of other part of left foot with necrosis of bone: Secondary | ICD-10-CM

## 2024-04-02 NOTE — Progress Notes (Signed)
 Subjective:  Patient ID: Dakota Becker, male    DOB: 1976/04/08,  MRN: [id number]  Chief Complaint  Patient presents with   Post-op Follow-up    Woundvac changed last Thursday. No excessive drainage. No pain.     DOS: 01/27/24 Proc: 1. Partial first ray amputation of left foot 2. 2nd toe amputation at [facility] level of left foot 3. Application dissolvable antibiotic beads left foot  DOS: 01/29/24 1.  Repeat irrigation and debridement partial second metatarsal resection, left foot 2.  Dermal allograft application 5 x 4 cm, left foot 3.  Application negative pressure wound therapy 5 x 4 cm, left foot  48 y.o. male seen for post op check.  Now approximately 9 weeks out from above procedures.     Review of Systems: Negative except as noted in the HPI. Denies N/V/F/Ch.   Objective:   Constitutional Well developed. Well nourished.  Vascular Foot warm and well perfused. Capillary refill normal to all digits.   No calf pain with palpation  Neurologic Normal speech. Oriented to person, place, and time. Epicritic sensation absent to left foot  Dermatologic The area of open wound at the dorsal medial aspect of the left foot appears to be healthy with granular and minimal fibrotic tissues present in wound bed.   Wound measures approximately 3 x 3.5 cm.  Decreased maceration overall healthy granular tissue bed and fill improved from prior     Orthopedic: Status post partial 1st and 2nd ray amputation   Radiographs: 01/29/2024 repeat amputation of the first ray and out of the proximal metatarsal shaft and repeat amputation of the second ray and out of the proximal mid metatarsal shaft.  Pathology: 01/27/2024 A. FOOT, PARTIAL LEFT FIRST RAY AND 2ND TOE, AMPUTATION:  - Benign bone with mild acute inflammation and bacterial colonies  - Margin shows acute inflammation and necrosis, and appears partially  viable  - No malignancy identified   Micro: 01/27/2024 Enterococcus faecalis and group F  strep  Assessment:   48 y.o. male with PMHx significant for DM2 with gas gangrene and osteomyelitis of left hallux and gangrene of 2nd toe with concern for underlying osteomyelitis   Now s/p partial 1st and 2nd ray amputation with graft and wound vac application.  Plan:  Patient was evaluated and treated and all questions answered.  9 weeks s/p above procedure -Progressing As expected postoperatively- continue wound VAC therapy twice weekly -Debrided the wound of any necrotic or fibrotic tissues present in wound bed at this time to healthy bleeding base and new collagen dressing followed by 4 x 4 Kerlix Ace was applied  -XR: Expected postop changes -WB Status: Heel weightbearing in postop shoe  -Sutures: None present -Medications/ABX: Continue antibiotics per ID recommendations   -Dressing: Continue twice weekly wound VAC application.  Applied a silver alginate dressing today wound VAC will be replaced tomorrow  - Continues to improve and appears healthy.  At this point I believe there is been enough and feel of healthy tissue to proceed with operative debridement and grafting with wound VAC application.  Patient is agreeable to this.  I discussed the risk benefits alternatives and possible complications as well as need for further surgery or loss of limb. -Plan will be for debridement with amniotic graft application and likely continue with wound VAC until fully healed -Begin operative planning for surgical consent was obtained at this visit. - F/u Plan: Follow-up after operating room         Marolyn JULIANNA Honour, DPM  [facility] / [facility]

## 2024-04-03 ENCOUNTER — Telehealth: Payer: Self-pay | Admitting: Podiatry

## 2024-04-03 DIAGNOSIS — E1151 Type 2 diabetes mellitus with diabetic peripheral angiopathy without gangrene: Secondary | ICD-10-CM | POA: Diagnosis not present

## 2024-04-03 DIAGNOSIS — I82402 Acute embolism and thrombosis of unspecified deep veins of left lower extremity: Secondary | ICD-10-CM | POA: Diagnosis not present

## 2024-04-03 DIAGNOSIS — Z6828 Body mass index (BMI) 28.0-28.9, adult: Secondary | ICD-10-CM | POA: Diagnosis not present

## 2024-04-03 DIAGNOSIS — Z794 Long term (current) use of insulin: Secondary | ICD-10-CM | POA: Diagnosis not present

## 2024-04-03 DIAGNOSIS — E1169 Type 2 diabetes mellitus with other specified complication: Secondary | ICD-10-CM | POA: Diagnosis not present

## 2024-04-03 DIAGNOSIS — L89892 Pressure ulcer of other site, stage 2: Secondary | ICD-10-CM | POA: Diagnosis not present

## 2024-04-03 DIAGNOSIS — T8140XA Infection following a procedure, unspecified, initial encounter: Secondary | ICD-10-CM | POA: Diagnosis not present

## 2024-04-03 DIAGNOSIS — I10 Essential (primary) hypertension: Secondary | ICD-10-CM | POA: Diagnosis not present

## 2024-04-03 DIAGNOSIS — S91302A Unspecified open wound, left foot, initial encounter: Secondary | ICD-10-CM | POA: Diagnosis not present

## 2024-04-03 DIAGNOSIS — Z7901 Long term (current) use of anticoagulants: Secondary | ICD-10-CM | POA: Diagnosis not present

## 2024-04-03 DIAGNOSIS — E669 Obesity, unspecified: Secondary | ICD-10-CM | POA: Diagnosis not present

## 2024-04-03 DIAGNOSIS — M869 Osteomyelitis, unspecified: Secondary | ICD-10-CM | POA: Diagnosis not present

## 2024-04-03 DIAGNOSIS — F1090 Alcohol use, unspecified, uncomplicated: Secondary | ICD-10-CM | POA: Diagnosis not present

## 2024-04-03 DIAGNOSIS — K5900 Constipation, unspecified: Secondary | ICD-10-CM | POA: Diagnosis not present

## 2024-04-03 DIAGNOSIS — Z4889 Encounter for other specified surgical aftercare: Secondary | ICD-10-CM | POA: Diagnosis not present

## 2024-04-03 DIAGNOSIS — E785 Hyperlipidemia, unspecified: Secondary | ICD-10-CM | POA: Diagnosis not present

## 2024-04-03 DIAGNOSIS — A409 Streptococcal sepsis, unspecified: Secondary | ICD-10-CM | POA: Diagnosis not present

## 2024-04-03 NOTE — Telephone Encounter (Signed)
 Can the patient continue with wet-to-dry dressings until Friday, when wound vac supplies are expected to arrive?  Please advise.  Heather: Do we happen to have any wound vac supplies here in office that can be picked? Drape, foam and canister   Please call Charmaine, Nurse @ [phone] with updates to order and if we have wound vac supplies.

## 2024-04-04 DIAGNOSIS — T8140XA Infection following a procedure, unspecified, initial encounter: Secondary | ICD-10-CM | POA: Diagnosis not present

## 2024-04-04 DIAGNOSIS — S91302A Unspecified open wound, left foot, initial encounter: Secondary | ICD-10-CM | POA: Diagnosis not present

## 2024-04-05 DIAGNOSIS — E785 Hyperlipidemia, unspecified: Secondary | ICD-10-CM | POA: Diagnosis not present

## 2024-04-05 DIAGNOSIS — Z794 Long term (current) use of insulin: Secondary | ICD-10-CM | POA: Diagnosis not present

## 2024-04-05 DIAGNOSIS — Z6828 Body mass index (BMI) 28.0-28.9, adult: Secondary | ICD-10-CM | POA: Diagnosis not present

## 2024-04-05 DIAGNOSIS — I10 Essential (primary) hypertension: Secondary | ICD-10-CM | POA: Diagnosis not present

## 2024-04-05 DIAGNOSIS — M869 Osteomyelitis, unspecified: Secondary | ICD-10-CM | POA: Diagnosis not present

## 2024-04-05 DIAGNOSIS — I82402 Acute embolism and thrombosis of unspecified deep veins of left lower extremity: Secondary | ICD-10-CM | POA: Diagnosis not present

## 2024-04-05 DIAGNOSIS — F1090 Alcohol use, unspecified, uncomplicated: Secondary | ICD-10-CM | POA: Diagnosis not present

## 2024-04-05 DIAGNOSIS — Z7901 Long term (current) use of anticoagulants: Secondary | ICD-10-CM | POA: Diagnosis not present

## 2024-04-05 DIAGNOSIS — E1169 Type 2 diabetes mellitus with other specified complication: Secondary | ICD-10-CM | POA: Diagnosis not present

## 2024-04-05 DIAGNOSIS — Z4889 Encounter for other specified surgical aftercare: Secondary | ICD-10-CM | POA: Diagnosis not present

## 2024-04-05 DIAGNOSIS — E1151 Type 2 diabetes mellitus with diabetic peripheral angiopathy without gangrene: Secondary | ICD-10-CM | POA: Diagnosis not present

## 2024-04-05 DIAGNOSIS — A409 Streptococcal sepsis, unspecified: Secondary | ICD-10-CM | POA: Diagnosis not present

## 2024-04-05 DIAGNOSIS — S91302A Unspecified open wound, left foot, initial encounter: Secondary | ICD-10-CM | POA: Diagnosis not present

## 2024-04-05 DIAGNOSIS — E669 Obesity, unspecified: Secondary | ICD-10-CM | POA: Diagnosis not present

## 2024-04-05 DIAGNOSIS — K5900 Constipation, unspecified: Secondary | ICD-10-CM | POA: Diagnosis not present

## 2024-04-05 DIAGNOSIS — T8140XA Infection following a procedure, unspecified, initial encounter: Secondary | ICD-10-CM | POA: Diagnosis not present

## 2024-04-05 DIAGNOSIS — L89892 Pressure ulcer of other site, stage 2: Secondary | ICD-10-CM | POA: Diagnosis not present

## 2024-04-06 DIAGNOSIS — T8140XA Infection following a procedure, unspecified, initial encounter: Secondary | ICD-10-CM | POA: Diagnosis not present

## 2024-04-06 DIAGNOSIS — S91302A Unspecified open wound, left foot, initial encounter: Secondary | ICD-10-CM | POA: Diagnosis not present

## 2024-04-07 DIAGNOSIS — T8140XA Infection following a procedure, unspecified, initial encounter: Secondary | ICD-10-CM | POA: Diagnosis not present

## 2024-04-07 DIAGNOSIS — S91302A Unspecified open wound, left foot, initial encounter: Secondary | ICD-10-CM | POA: Diagnosis not present

## 2024-04-08 ENCOUNTER — Telehealth: Payer: Self-pay | Admitting: Podiatry

## 2024-04-08 DIAGNOSIS — S91302A Unspecified open wound, left foot, initial encounter: Secondary | ICD-10-CM | POA: Diagnosis not present

## 2024-04-08 DIAGNOSIS — T8140XA Infection following a procedure, unspecified, initial encounter: Secondary | ICD-10-CM | POA: Diagnosis not present

## 2024-04-08 NOTE — Telephone Encounter (Signed)
 Pt left message checking to see if he had missed a call to get his surgery scheduled.  I returned call and he is scheduled for 7/23 at [facility].  Pt is not on GLP1 medications is on blood thinner and was advised by Dr Malvin to stop that 2 days prior to surgery. The pharmacy is correct in the chart.

## 2024-04-08 NOTE — Telephone Encounter (Signed)
 Slater calling to get surgical clearance for Pt. Please call her @ [phone]

## 2024-04-09 ENCOUNTER — Telehealth: Payer: Self-pay | Admitting: Lab

## 2024-04-09 ENCOUNTER — Telehealth: Payer: Self-pay | Admitting: Podiatry

## 2024-04-09 DIAGNOSIS — E669 Obesity, unspecified: Secondary | ICD-10-CM | POA: Diagnosis not present

## 2024-04-09 DIAGNOSIS — Z89422 Acquired absence of other left toe(s): Secondary | ICD-10-CM | POA: Diagnosis not present

## 2024-04-09 DIAGNOSIS — Z792 Long term (current) use of antibiotics: Secondary | ICD-10-CM | POA: Diagnosis not present

## 2024-04-09 DIAGNOSIS — L97522 Non-pressure chronic ulcer of other part of left foot with fat layer exposed: Secondary | ICD-10-CM | POA: Diagnosis not present

## 2024-04-09 DIAGNOSIS — E1151 Type 2 diabetes mellitus with diabetic peripheral angiopathy without gangrene: Secondary | ICD-10-CM | POA: Diagnosis not present

## 2024-04-09 DIAGNOSIS — E782 Mixed hyperlipidemia: Secondary | ICD-10-CM | POA: Diagnosis not present

## 2024-04-09 DIAGNOSIS — G629 Polyneuropathy, unspecified: Secondary | ICD-10-CM | POA: Diagnosis not present

## 2024-04-09 DIAGNOSIS — E11621 Type 2 diabetes mellitus with foot ulcer: Secondary | ICD-10-CM | POA: Diagnosis not present

## 2024-04-09 DIAGNOSIS — E785 Hyperlipidemia, unspecified: Secondary | ICD-10-CM | POA: Diagnosis not present

## 2024-04-09 DIAGNOSIS — T8140XA Infection following a procedure, unspecified, initial encounter: Secondary | ICD-10-CM | POA: Diagnosis not present

## 2024-04-09 DIAGNOSIS — Z7901 Long term (current) use of anticoagulants: Secondary | ICD-10-CM | POA: Diagnosis not present

## 2024-04-09 DIAGNOSIS — Z86718 Personal history of other venous thrombosis and embolism: Secondary | ICD-10-CM | POA: Diagnosis not present

## 2024-04-09 DIAGNOSIS — K219 Gastro-esophageal reflux disease without esophagitis: Secondary | ICD-10-CM | POA: Diagnosis not present

## 2024-04-09 DIAGNOSIS — S91302A Unspecified open wound, left foot, initial encounter: Secondary | ICD-10-CM | POA: Diagnosis not present

## 2024-04-09 DIAGNOSIS — Z794 Long term (current) use of insulin: Secondary | ICD-10-CM | POA: Diagnosis not present

## 2024-04-09 DIAGNOSIS — Z6828 Body mass index (BMI) 28.0-28.9, adult: Secondary | ICD-10-CM | POA: Diagnosis not present

## 2024-04-09 DIAGNOSIS — A409 Streptococcal sepsis, unspecified: Secondary | ICD-10-CM | POA: Diagnosis not present

## 2024-04-09 DIAGNOSIS — E1169 Type 2 diabetes mellitus with other specified complication: Secondary | ICD-10-CM | POA: Diagnosis not present

## 2024-04-09 DIAGNOSIS — M869 Osteomyelitis, unspecified: Secondary | ICD-10-CM | POA: Diagnosis not present

## 2024-04-09 DIAGNOSIS — D473 Essential (hemorrhagic) thrombocythemia: Secondary | ICD-10-CM | POA: Diagnosis not present

## 2024-04-09 DIAGNOSIS — I10 Essential (primary) hypertension: Secondary | ICD-10-CM | POA: Diagnosis not present

## 2024-04-09 NOTE — Telephone Encounter (Signed)
 DOS- 04/17/2024  SURGICAL PREPARATION OF A RECIPIENT SITE FOR SKIN GRAFTING (LT)- 15004 APPLICATION OF A SKIN SUBSTITUTE GRAFT FOR FIRST 25 SQUARE CENTIMETERS (LT)- 15275 NEGATIVE PRESSURE WOUND THERAPY UTILIZING DME (LT)- 02394  BCBS FED EFFECTIVE DATE- 10/07/2000  DEDUCTIBLE- N/A OOP- $1500 $150 COPAY FOR OFFICE SURGERY, $200 COPAY FOR ELSEWHERE COINSURANCE: 30%  REF # FOR BENEFIT INQUIRY: [id number]  PER BRANDON W WITH [employer], NO PRIOR AUTHS REQUIRED FOR CPT CODES 84995, [id number], [id number].  REF #: [facility] 04/09/2024 9:33 AM EST

## 2024-04-09 NOTE — Telephone Encounter (Signed)
 Charmaine called wanting verbal order to continue wound vac changes for patient order given via phone I.[email or web address]

## 2024-04-10 DIAGNOSIS — S91302A Unspecified open wound, left foot, initial encounter: Secondary | ICD-10-CM | POA: Diagnosis not present

## 2024-04-10 DIAGNOSIS — T8140XA Infection following a procedure, unspecified, initial encounter: Secondary | ICD-10-CM | POA: Diagnosis not present

## 2024-04-11 ENCOUNTER — Encounter (HOSPITAL_COMMUNITY): Payer: Self-pay

## 2024-04-11 DIAGNOSIS — T8140XA Infection following a procedure, unspecified, initial encounter: Secondary | ICD-10-CM | POA: Diagnosis not present

## 2024-04-11 DIAGNOSIS — S91302A Unspecified open wound, left foot, initial encounter: Secondary | ICD-10-CM | POA: Diagnosis not present

## 2024-04-11 NOTE — Progress Notes (Addendum)
 Surgical Instructions   Your procedure is scheduled on Wednesday, 04/17/24.SABRA Report to [facility] Main Entrance A at 6:30 A.M., then check in with the Admitting office. Any questions or running late day of surgery: call [phone]  Questions prior to your surgery date: call [phone], Monday-Friday, 8am-4pm. If you experience any cold or flu symptoms such as cough, fever, chills, shortness of breath, etc. between now and your scheduled surgery, please notify us  at the above number.     Remember:  Do not eat after midnight the night before your surgery-Tuesday   You may drink clear liquids until 5:30 AM, the morning of your surgery-Wednesday.   Clear liquids allowed are: Water, Non-Citrus Juices (without pulp), Carbonated Beverages, Clear Tea (no milk, honey, etc.), Black Coffee Only (NO MILK, CREAM OR POWDERED CREAMER of any kind), and Gatorade.    Take these medicines the morning of surgery with A SIP OF WATER: gabapentin  (NEURONTIN )  simvastatin (ZOCOR)   May take these medicines IF NEEDED: None  Hold Eliquis  for 2 days prior to procedure.  Last dose will be on 01/14/24.    DIABETES MEDS: INSULIN  (LONG ACTING) MEDICATION INSTRUCTIONS (Lantus )  The day before your procedure:  Take  your regular daily dose   The day of your procedure:  Do not take your morning dose We will check your blood sugar levels during the admission process and again in Recovery before discharging you home   INSULIN  (SHORT ACTING) MEDICATION INSTRUCTIONS (Novolog )  The day before your procedure:  Do not take your evening dose  The day of your procedure:  Do not take your morning dose We will check your blood sugar levels during the admission process and again in Recovery before discharging you home                                                                                          One week prior to surgery, STOP taking any Aspirin (unless otherwise instructed by your surgeon)  Aleve , Naproxen , Ibuprofen , Motrin , Advil , Goody's, BC's, all herbal medications, fish oil, and non-prescription vitamins.                     Do NOT Smoke (Tobacco/Vaping) for 24 hours prior to your procedure.  If you use a CPAP at night, you may bring your mask/headgear for your overnight stay.   You will be asked to remove any contacts, glasses, piercing's, hearing aid's, dentures/partials prior to surgery. Please bring cases for these items if needed.    Patients discharged the day of surgery will not be allowed to drive home, and someone needs to stay with them for 24 hours.  SURGICAL WAITING ROOM VISITATION Patients may have no more than 2 support people in the waiting area - these visitors may rotate.   Pre-op nurse will coordinate an appropriate time for 1 ADULT support person, who may not rotate, to accompany patient in pre-op.  Children under the age of 44 must have an adult with them who is not the patient and must remain in the main waiting area with an adult.  If the patient needs to stay  at the hospital during part of their recovery, the visitor guidelines for inpatient rooms apply.  Please refer to the [facility] website for the visitor guidelines for any additional information.   If you received a COVID test during your pre-op visit  it is requested that you wear a mask when out in public, stay away from anyone that may not be feeling well and notify your surgeon if you develop symptoms. If you have been in contact with anyone that has tested positive in the last 10 days please notify you surgeon.      Pre-operative CHG Bathing Instructions   You can play a key role in reducing the risk of infection after surgery. Your skin needs to be as free of germs as possible. You can reduce the number of germs on your skin by washing with CHG (chlorhexidine  gluconate) soap before surgery. CHG is an antiseptic soap that kills germs and continues to kill germs even after washing.    DO NOT use if you have an allergy to chlorhexidine /CHG or antibacterial soaps. If your skin becomes reddened or irritated, stop using the CHG and notify one of our RNs at [phone].              TAKE A SHOWER THE NIGHT-Tuesday BEFORE SURGERY-Wednesday AND THE DAY OF SURGERY    Please keep in mind the following:  DO NOT shave, including legs and underarms, 48 hours prior to surgery.   You may shave your face before/day of surgery.  Place clean sheets on your bed the night before surgery Use a clean washcloth (not used since being washed) for each shower. DO NOT sleep with pet's night before surgery.  CHG Shower Instructions:  Wash your face and private area with normal soap. If you choose to wash your hair, wash first with your normal shampoo.  After you use shampoo/soap, rinse your hair and body thoroughly to remove shampoo/soap residue.  Turn the water OFF and apply half the bottle of CHG soap to a CLEAN washcloth.  Apply CHG soap ONLY FROM YOUR NECK DOWN TO YOUR TOES (washing for 3-5 minutes)  DO NOT use CHG soap on face, private areas, open wounds, or sores.  Pay special attention to the area where your surgery is being performed.  If you are having back surgery, having someone wash your back for you may be helpful. Wait 2 minutes after CHG soap is applied, then you may rinse off the CHG soap.  Pat dry with a clean towel  Put on clean pajamas    Additional instructions for the day of surgery: DO NOT APPLY any lotions, deodorants, cologne, or perfumes.   Do not wear jewelry or makeup Do not wear nail polish, gel polish, artificial nails, or any other type of covering on natural nails (fingers and toes) Do not bring valuables to the hospital. [facility] is not responsible for valuables/personal belongings. Put on clean/comfortable clothes.  Please brush your teeth.  Ask your nurse before applying any prescription medications to the skin.

## 2024-04-12 ENCOUNTER — Encounter (HOSPITAL_COMMUNITY): Payer: Self-pay

## 2024-04-12 ENCOUNTER — Other Ambulatory Visit: Payer: Self-pay

## 2024-04-12 ENCOUNTER — Encounter (HOSPITAL_COMMUNITY)
Admission: RE | Admit: 2024-04-12 | Discharge: 2024-04-12 | Disposition: A | Discharge status: Home / Self Care | Source: Ambulatory Visit | Attending: Podiatry | Admitting: Podiatry

## 2024-04-12 VITALS — BP 133/85 | HR 105 | Temp 98.4°F | Resp 18 | Ht 72.0 in | Wt 256.9 lb

## 2024-04-12 DIAGNOSIS — Z01812 Encounter for preprocedural laboratory examination: Secondary | ICD-10-CM | POA: Diagnosis not present

## 2024-04-12 DIAGNOSIS — Z89422 Acquired absence of other left toe(s): Secondary | ICD-10-CM | POA: Diagnosis not present

## 2024-04-12 DIAGNOSIS — Z792 Long term (current) use of antibiotics: Secondary | ICD-10-CM | POA: Diagnosis not present

## 2024-04-12 DIAGNOSIS — L97524 Non-pressure chronic ulcer of other part of left foot with necrosis of bone: Secondary | ICD-10-CM | POA: Diagnosis not present

## 2024-04-12 DIAGNOSIS — E669 Obesity, unspecified: Secondary | ICD-10-CM | POA: Diagnosis not present

## 2024-04-12 DIAGNOSIS — L97522 Non-pressure chronic ulcer of other part of left foot with fat layer exposed: Secondary | ICD-10-CM | POA: Diagnosis not present

## 2024-04-12 DIAGNOSIS — E1151 Type 2 diabetes mellitus with diabetic peripheral angiopathy without gangrene: Secondary | ICD-10-CM | POA: Diagnosis not present

## 2024-04-12 DIAGNOSIS — T8140XA Infection following a procedure, unspecified, initial encounter: Secondary | ICD-10-CM | POA: Diagnosis not present

## 2024-04-12 DIAGNOSIS — E11621 Type 2 diabetes mellitus with foot ulcer: Secondary | ICD-10-CM | POA: Diagnosis not present

## 2024-04-12 DIAGNOSIS — S91302A Unspecified open wound, left foot, initial encounter: Secondary | ICD-10-CM | POA: Diagnosis not present

## 2024-04-12 DIAGNOSIS — Z86718 Personal history of other venous thrombosis and embolism: Secondary | ICD-10-CM | POA: Diagnosis not present

## 2024-04-12 DIAGNOSIS — M869 Osteomyelitis, unspecified: Secondary | ICD-10-CM | POA: Diagnosis not present

## 2024-04-12 DIAGNOSIS — Z01818 Encounter for other preprocedural examination: Secondary | ICD-10-CM

## 2024-04-12 DIAGNOSIS — Z794 Long term (current) use of insulin: Secondary | ICD-10-CM | POA: Diagnosis not present

## 2024-04-12 DIAGNOSIS — Z7901 Long term (current) use of anticoagulants: Secondary | ICD-10-CM | POA: Diagnosis not present

## 2024-04-12 DIAGNOSIS — E1169 Type 2 diabetes mellitus with other specified complication: Secondary | ICD-10-CM | POA: Diagnosis not present

## 2024-04-12 DIAGNOSIS — A409 Streptococcal sepsis, unspecified: Secondary | ICD-10-CM | POA: Diagnosis not present

## 2024-04-12 DIAGNOSIS — E785 Hyperlipidemia, unspecified: Secondary | ICD-10-CM | POA: Diagnosis not present

## 2024-04-12 DIAGNOSIS — Z6828 Body mass index (BMI) 28.0-28.9, adult: Secondary | ICD-10-CM | POA: Diagnosis not present

## 2024-04-12 DIAGNOSIS — I10 Essential (primary) hypertension: Secondary | ICD-10-CM | POA: Diagnosis not present

## 2024-04-12 LAB — GLUCOSE, CAPILLARY: Glucose-Capillary: 257 mg/dL — ABNORMAL HIGH (ref 70–99)

## 2024-04-12 LAB — CBC
HCT: 44.7 % (ref 39.0–52.0)
Hemoglobin: 14.4 g/dL (ref 13.0–17.0)
MCH: 29 pg (ref 26.0–34.0)
MCHC: 32.2 g/dL (ref 30.0–36.0)
MCV: 90.1 fL (ref 80.0–100.0)
Platelets: 360 K/uL (ref 150–400)
RBC: 4.96 MIL/uL (ref 4.22–5.81)
RDW: 12.7 % (ref 11.5–15.5)
WBC: 7.2 K/uL (ref 4.0–10.5)
nRBC: 0 % (ref 0.0–0.2)

## 2024-04-12 LAB — BASIC METABOLIC PANEL WITH GFR
Anion gap: 9 (ref 5–15)
BUN: 17 mg/dL (ref 6–20)
CO2: 25 mmol/L (ref 22–32)
Calcium: 9.6 mg/dL (ref 8.9–10.3)
Chloride: 103 mmol/L (ref 98–111)
Creatinine, Ser: 0.91 mg/dL (ref 0.61–1.24)
GFR, Estimated: >60 mL/min (ref >60)
Glucose, Bld: 244 mg/dL — ABNORMAL HIGH (ref 70–99)
Potassium: 5 mmol/L (ref 3.5–5.1)
Sodium: 137 mmol/L (ref 135–145)

## 2024-04-12 LAB — HEMOGLOBIN A1C
Hgb A1c MFr Bld: 7.2 % — ABNORMAL HIGH (ref 4.8–5.6)
Mean Plasma Glucose: 159.94 mg/dL

## 2024-04-12 NOTE — Progress Notes (Addendum)
 Surgical Instructions   Your procedure is scheduled on Wednesday, 04/17/24.SABRA Report to [facility] Main Entrance A at 6:30 A.M., then check in with the Admitting office. Any questions or running late day of surgery: call [phone]  Questions prior to your surgery date: call [phone], Monday-Friday, 8am-4pm. If you experience any cold or flu symptoms such as cough, fever, chills, shortness of breath, etc. between now and your scheduled surgery, please notify us  at the above number.     Remember:  Do not eat after midnight the night before your surgery-Tuesday   You may drink clear liquids until 5:30 AM, the morning of your surgery-Wednesday.   Clear liquids allowed are: Water, Non-Citrus Juices (without pulp), Carbonated Beverages, Clear Tea (no milk, honey, etc.), Black Coffee Only (NO MILK, CREAM OR POWDERED CREAMER of any kind), and Gatorade.    Take these medicines the morning of surgery with A SIP OF WATER: gabapentin  (NEURONTIN )  simvastatin (ZOCOR)   May take these medicines IF NEEDED: None  Hold Eliquis  for 2 days prior to procedure.  Last dose will be on 04/14/24.   WHAT DO I DO ABOUT MY DIABETES MEDICATION?   THE MORNING OF SURGERY, take 17 units of glargine(Lantus )insulin .  If your CBG is greater than 220 mg/dL the morning of surgery you may take  of your sliding scale (correction) dose 5 units of Novolog  insulin .   HOW TO MANAGE YOUR DIABETES BEFORE AND AFTER SURGERY  Why is it important to control my blood sugar before and after surgery? Improving blood sugar levels before and after surgery helps healing and can limit problems. A way of improving blood sugar control is eating a healthy diet by:  Eating less sugar and carbohydrates  Increasing activity/exercise  Talking with your doctor about reaching your blood sugar goals High blood sugars (greater than 180 mg/dL) can raise your risk of infections and slow your recovery, so you will need to focus on  controlling your diabetes during the weeks before surgery. Make sure that the doctor who takes care of your diabetes knows about your planned surgery including the date and location.  How do I manage my blood sugar before surgery? Check your blood sugar at least 4 times a day, starting 2 days before surgery, to make sure that the level is not too high or low.  Check your blood sugar the morning of your surgery when you wake up and every 2 hours until you get to the Short Stay unit.  If your blood sugar is less than 70 mg/dL, you will need to treat for low blood sugar: Do not take insulin . Treat a low blood sugar (less than 70 mg/dL) with  cup of clear juice (cranberry or apple), 4 glucose tablets, OR glucose gel. Recheck blood sugar in 15 minutes after treatment (to make sure it is greater than 70 mg/dL). If your blood sugar is not greater than 70 mg/dL on recheck, call [phone] for further instructions. Report your blood sugar to the short stay nurse when you get to Short Stay.  If you are admitted to the hospital after surgery: Your blood sugar will be checked by the staff and you will probably be given insulin  after surgery (instead of oral diabetes medicines) to make sure you have good blood sugar levels. The goal for blood sugar control after surgery is 80-180 mg/dL.  One week prior to surgery, STOP taking any Aspirin (unless otherwise instructed by your surgeon) Aleve , Naproxen , Ibuprofen , Motrin , Advil , Goody's, BC's, all herbal medications, fish oil, and non-prescription vitamins.                     Do NOT Smoke (Tobacco/Vaping) for 24 hours prior to your procedure.  If you use a CPAP at night, you may bring your mask/headgear for your overnight stay.   You will be asked to remove any contacts, glasses, piercing's, hearing aid's, dentures/partials prior to surgery. Please bring cases for  these items if needed.    Patients discharged the day of surgery will not be allowed to drive home, and someone needs to stay with them for 24 hours.  SURGICAL WAITING ROOM VISITATION Patients may have no more than 2 support people in the waiting area - these visitors may rotate.   Pre-op nurse will coordinate an appropriate time for 1 ADULT support person, who may not rotate, to accompany patient in pre-op.  Children under the age of 64 must have an adult with them who is not the patient and must remain in the main waiting area with an adult.  If the patient needs to stay at the hospital during part of their recovery, the visitor guidelines for inpatient rooms apply.  Please refer to the [facility] website for the visitor guidelines for any additional information.   If you received a COVID test during your pre-op visit  it is requested that you wear a mask when out in public, stay away from anyone that may not be feeling well and notify your surgeon if you develop symptoms. If you have been in contact with anyone that has tested positive in the last 10 days please notify you surgeon.      Pre-operative CHG Bathing Instructions   You can play a key role in reducing the risk of infection after surgery. Your skin needs to be as free of germs as possible. You can reduce the number of germs on your skin by washing with CHG (chlorhexidine  gluconate) soap before surgery. CHG is an antiseptic soap that kills germs and continues to kill germs even after washing.   DO NOT use if you have an allergy to chlorhexidine /CHG or antibacterial soaps. If your skin becomes reddened or irritated, stop using the CHG and notify one of our RNs at [phone].              TAKE A SHOWER THE NIGHT-Tuesday BEFORE SURGERY-Wednesday AND THE DAY OF SURGERY    Please keep in mind the following:  DO NOT shave, including legs and underarms, 48 hours prior to surgery.   You may shave your face before/day of surgery.   Place clean sheets on your bed the night before surgery Use a clean washcloth (not used since being washed) for each shower. DO NOT sleep with pet's night before surgery.  CHG Shower Instructions:  Wash your face and private area with normal soap. If you choose to wash your hair, wash first with your normal shampoo.  After you use shampoo/soap, rinse your hair and body thoroughly to remove shampoo/soap residue.  Turn the water OFF and apply half the bottle of CHG soap to a CLEAN washcloth.  Apply CHG soap ONLY FROM YOUR NECK DOWN TO YOUR TOES (washing for 3-5 minutes)  DO NOT use CHG soap on face, private areas, open wounds, or sores.  Pay special attention to the area where your  surgery is being performed.  If you are having back surgery, having someone wash your back for you may be helpful. Wait 2 minutes after CHG soap is applied, then you may rinse off the CHG soap.  Pat dry with a clean towel  Put on clean pajamas    Additional instructions for the day of surgery: DO NOT APPLY any lotions, deodorants, cologne, or perfumes.   Do not wear jewelry or makeup Do not wear nail polish, gel polish, artificial nails, or any other type of covering on natural nails (fingers and toes) Do not bring valuables to the hospital. [facility] is not responsible for valuables/personal belongings. Put on clean/comfortable clothes.  Please brush your teeth.  Ask your nurse before applying any prescription medications to the skin.

## 2024-04-12 NOTE — Progress Notes (Signed)
 PCP - Prentice Marvene BRISTLE Cardiologist - denies  PPM/ICD - denies Device Orders -  Rep Notified -   Chest x-ray - na EKG - 01/27/24 Stress Test - denies ECHO - TEE-02/02/24 Cardiac Cath - denies  Sleep Study - denies CPAP -   Fasting Blood Sugar - 200 Checks Blood Sugar -has Freestyle Libre  Last dose of GLP1 agonist-  na GLP1 instructions:   Blood Thinner Instructions: Eliquis - Hold 2 days prior to surgery. Last dose will be 04/14/24. Aspirin Instructions:na  ERAS Protcol -clear liquids until 0530 PRE-SURGERY Ensure or G2- no  COVID TEST- na   Anesthesia review: no  Patient denies shortness of breath, fever, cough and chest pain at PAT appointment   All instructions explained to the patient, with a verbal understanding of the material. Patient agrees to go over the instructions while at home for a better understanding. Patient also instructed to self quarantine after being tested for COVID-19. The opportunity to ask questions was provided.

## 2024-04-13 DIAGNOSIS — T8140XA Infection following a procedure, unspecified, initial encounter: Secondary | ICD-10-CM | POA: Diagnosis not present

## 2024-04-13 DIAGNOSIS — S91302A Unspecified open wound, left foot, initial encounter: Secondary | ICD-10-CM | POA: Diagnosis not present

## 2024-04-14 DIAGNOSIS — S91302A Unspecified open wound, left foot, initial encounter: Secondary | ICD-10-CM | POA: Diagnosis not present

## 2024-04-14 DIAGNOSIS — T8140XA Infection following a procedure, unspecified, initial encounter: Secondary | ICD-10-CM | POA: Diagnosis not present

## 2024-04-15 DIAGNOSIS — S91302A Unspecified open wound, left foot, initial encounter: Secondary | ICD-10-CM | POA: Diagnosis not present

## 2024-04-15 DIAGNOSIS — T8140XA Infection following a procedure, unspecified, initial encounter: Secondary | ICD-10-CM | POA: Diagnosis not present

## 2024-04-16 ENCOUNTER — Telehealth: Payer: Self-pay | Admitting: Lab

## 2024-04-16 DIAGNOSIS — A409 Streptococcal sepsis, unspecified: Secondary | ICD-10-CM | POA: Diagnosis not present

## 2024-04-16 DIAGNOSIS — Z7901 Long term (current) use of anticoagulants: Secondary | ICD-10-CM | POA: Diagnosis not present

## 2024-04-16 DIAGNOSIS — E1169 Type 2 diabetes mellitus with other specified complication: Secondary | ICD-10-CM | POA: Diagnosis not present

## 2024-04-16 DIAGNOSIS — E11621 Type 2 diabetes mellitus with foot ulcer: Secondary | ICD-10-CM | POA: Diagnosis not present

## 2024-04-16 DIAGNOSIS — E669 Obesity, unspecified: Secondary | ICD-10-CM | POA: Diagnosis not present

## 2024-04-16 DIAGNOSIS — L97522 Non-pressure chronic ulcer of other part of left foot with fat layer exposed: Secondary | ICD-10-CM | POA: Diagnosis not present

## 2024-04-16 DIAGNOSIS — Z792 Long term (current) use of antibiotics: Secondary | ICD-10-CM | POA: Diagnosis not present

## 2024-04-16 DIAGNOSIS — Z794 Long term (current) use of insulin: Secondary | ICD-10-CM | POA: Diagnosis not present

## 2024-04-16 DIAGNOSIS — S91302A Unspecified open wound, left foot, initial encounter: Secondary | ICD-10-CM | POA: Diagnosis not present

## 2024-04-16 DIAGNOSIS — E1151 Type 2 diabetes mellitus with diabetic peripheral angiopathy without gangrene: Secondary | ICD-10-CM | POA: Diagnosis not present

## 2024-04-16 DIAGNOSIS — T8140XA Infection following a procedure, unspecified, initial encounter: Secondary | ICD-10-CM | POA: Diagnosis not present

## 2024-04-16 DIAGNOSIS — E785 Hyperlipidemia, unspecified: Secondary | ICD-10-CM | POA: Diagnosis not present

## 2024-04-16 DIAGNOSIS — Z89422 Acquired absence of other left toe(s): Secondary | ICD-10-CM | POA: Diagnosis not present

## 2024-04-16 DIAGNOSIS — M869 Osteomyelitis, unspecified: Secondary | ICD-10-CM | POA: Diagnosis not present

## 2024-04-16 DIAGNOSIS — Z6828 Body mass index (BMI) 28.0-28.9, adult: Secondary | ICD-10-CM | POA: Diagnosis not present

## 2024-04-16 DIAGNOSIS — I10 Essential (primary) hypertension: Secondary | ICD-10-CM | POA: Diagnosis not present

## 2024-04-16 DIAGNOSIS — Z86718 Personal history of other venous thrombosis and embolism: Secondary | ICD-10-CM | POA: Diagnosis not present

## 2024-04-16 NOTE — Telephone Encounter (Signed)
 Home health nurse is inquiring about wound vac should it be placed today since he is having surgery tomorrow please call and clarify patient is scheduled for surgery also needs to know cleaning instructions.

## 2024-04-16 NOTE — Progress Notes (Signed)
 LM for pt and pt's wife with new arrival time 1020 for surgery tomorrow and 0920.

## 2024-04-16 NOTE — Telephone Encounter (Signed)
Spoke with nurse and relayed message

## 2024-04-17 ENCOUNTER — Encounter (HOSPITAL_COMMUNITY)
Admission: RE | Disposition: A | Discharge status: Home / Self Care | Payer: Self-pay | Source: Home / Self Care | Attending: Podiatry

## 2024-04-17 ENCOUNTER — Other Ambulatory Visit: Payer: Self-pay

## 2024-04-17 ENCOUNTER — Ambulatory Visit (HOSPITAL_COMMUNITY)
Admission: RE | Admit: 2024-04-17 | Discharge: 2024-04-17 | Disposition: A | Discharge status: Home / Self Care | Attending: Podiatry | Admitting: Podiatry

## 2024-04-17 ENCOUNTER — Ambulatory Visit (HOSPITAL_COMMUNITY): Payer: Self-pay | Admitting: Anesthesiology

## 2024-04-17 DIAGNOSIS — Z01818 Encounter for other preprocedural examination: Secondary | ICD-10-CM

## 2024-04-17 DIAGNOSIS — I10 Essential (primary) hypertension: Secondary | ICD-10-CM | POA: Diagnosis not present

## 2024-04-17 DIAGNOSIS — Z794 Long term (current) use of insulin: Secondary | ICD-10-CM | POA: Diagnosis not present

## 2024-04-17 DIAGNOSIS — E11621 Type 2 diabetes mellitus with foot ulcer: Secondary | ICD-10-CM | POA: Diagnosis not present

## 2024-04-17 DIAGNOSIS — L97524 Non-pressure chronic ulcer of other part of left foot with necrosis of bone: Secondary | ICD-10-CM | POA: Insufficient documentation

## 2024-04-17 DIAGNOSIS — S91302A Unspecified open wound, left foot, initial encounter: Secondary | ICD-10-CM | POA: Diagnosis not present

## 2024-04-17 DIAGNOSIS — Z833 Family history of diabetes mellitus: Secondary | ICD-10-CM | POA: Insufficient documentation

## 2024-04-17 DIAGNOSIS — Z86718 Personal history of other venous thrombosis and embolism: Secondary | ICD-10-CM | POA: Insufficient documentation

## 2024-04-17 DIAGNOSIS — E1169 Type 2 diabetes mellitus with other specified complication: Secondary | ICD-10-CM | POA: Diagnosis not present

## 2024-04-17 DIAGNOSIS — Z7901 Long term (current) use of anticoagulants: Secondary | ICD-10-CM | POA: Diagnosis not present

## 2024-04-17 DIAGNOSIS — M869 Osteomyelitis, unspecified: Secondary | ICD-10-CM | POA: Diagnosis not present

## 2024-04-17 DIAGNOSIS — E785 Hyperlipidemia, unspecified: Secondary | ICD-10-CM | POA: Insufficient documentation

## 2024-04-17 DIAGNOSIS — T8140XA Infection following a procedure, unspecified, initial encounter: Secondary | ICD-10-CM | POA: Diagnosis not present

## 2024-04-17 HISTORY — PX: ALLOGRAFT APPLICATION: SHX6404

## 2024-04-17 HISTORY — PX: APPLICATION OF WOUND VAC: SHX5189

## 2024-04-17 LAB — GLUCOSE, CAPILLARY
Glucose-Capillary: 162 mg/dL — ABNORMAL HIGH (ref 70–99)
Glucose-Capillary: 144 mg/dL — ABNORMAL HIGH (ref 70–99)

## 2024-04-17 SURGERY — APPLICATION, ALLOGRAFT, SKIN
Anesthesia: Monitor Anesthesia Care | Site: Foot | Laterality: Left

## 2024-04-17 MED ORDER — MIDAZOLAM HCL 2 MG/2ML IJ SOLN
INTRAMUSCULAR | Status: AC
  Filled 2024-04-17: qty 2

## 2024-04-17 MED ORDER — ACETAMINOPHEN 500 MG PO TABS
1000.0000 mg | ORAL_TABLET | Freq: Once | ORAL | Status: AC
  Administered 2024-04-17: 1000 mg via ORAL
  Filled 2024-04-17: qty 2

## 2024-04-17 MED ORDER — BUPIVACAINE HCL (PF) 0.5 % IJ SOLN
INTRAMUSCULAR | Status: DC | PRN
  Administered 2024-04-17: 10 mL

## 2024-04-17 MED ORDER — ONDANSETRON HCL 4 MG/2ML IJ SOLN
INTRAMUSCULAR | Status: DC | PRN
  Administered 2024-04-17: 4 mg via INTRAVENOUS

## 2024-04-17 MED ORDER — FENTANYL CITRATE (PF) 250 MCG/5ML IJ SOLN
INTRAMUSCULAR | Status: AC
  Filled 2024-04-17: qty 5

## 2024-04-17 MED ORDER — PROPOFOL 1000 MG/100ML IV EMUL
INTRAVENOUS | Status: AC
  Filled 2024-04-17: qty 300

## 2024-04-17 MED ORDER — MIDAZOLAM HCL 2 MG/2ML IJ SOLN
INTRAMUSCULAR | Status: DC | PRN
  Administered 2024-04-17: 2 mg via INTRAVENOUS

## 2024-04-17 MED ORDER — BUPIVACAINE HCL (PF) 0.5 % IJ SOLN
INTRAMUSCULAR | Status: AC
Start: 2024-04-17 — End: 2024-04-17
  Filled 2024-04-17: qty 30

## 2024-04-17 MED ORDER — ORAL CARE MOUTH RINSE: 15.0000 mL | Freq: Once | OROMUCOSAL | Status: AC

## 2024-04-17 MED ORDER — ONDANSETRON HCL 4 MG/2ML IJ SOLN
INTRAMUSCULAR | Status: AC
  Filled 2024-04-17: qty 2

## 2024-04-17 MED ORDER — PHENYLEPHRINE 80 MCG/ML (10ML) SYRINGE FOR IV PUSH (FOR BLOOD PRESSURE SUPPORT)
PREFILLED_SYRINGE | INTRAVENOUS | Status: DC | PRN
  Administered 2024-04-17 (×4): 80 ug via INTRAVENOUS

## 2024-04-17 MED ORDER — INSULIN ASPART 100 UNIT/ML IJ SOLN
INTRAMUSCULAR | Status: AC
  Administered 2024-04-17: 2 [IU] via SUBCUTANEOUS
  Filled 2024-04-17: qty 1

## 2024-04-17 MED ORDER — LACTATED RINGERS IV SOLN: INTRAVENOUS | Status: DC

## 2024-04-17 MED ORDER — CHLORHEXIDINE GLUCONATE 0.12 % MT SOLN
15.0000 mL | Freq: Once | OROMUCOSAL | Status: AC
  Administered 2024-04-17: 15 mL via OROMUCOSAL
  Filled 2024-04-17: qty 15

## 2024-04-17 MED ORDER — PROPOFOL 10 MG/ML IV BOLUS
INTRAVENOUS | Status: DC | PRN
  Administered 2024-04-17: 100 ug/kg/min via INTRAVENOUS
  Administered 2024-04-17: 30 mg via INTRAVENOUS

## 2024-04-17 MED ORDER — INSULIN ASPART 100 UNIT/ML IJ SOLN: 0.0000 [IU] | INTRAMUSCULAR | Status: DC | PRN

## 2024-04-17 MED ORDER — 0.9 % SODIUM CHLORIDE (POUR BTL) OPTIME
TOPICAL | Status: DC | PRN
  Administered 2024-04-17: 1000 mL

## 2024-04-17 MED ORDER — PROPOFOL 10 MG/ML IV BOLUS
INTRAVENOUS | Status: AC
  Filled 2024-04-17: qty 20

## 2024-04-17 MED ORDER — PHENYLEPHRINE HCL-NACL 20-0.9 MG/250ML-% IV SOLN
INTRAVENOUS | Status: AC
  Filled 2024-04-17: qty 250

## 2024-04-17 SURGICAL SUPPLY — 48 items
ALLOGRAFT AMNI BIOVANCE 3X6 3L (Graft) IMPLANT
BLADE SURG 15 STRL LF DISP TIS (BLADE) ×1 IMPLANT
BNDG COMPR ESMARK 4X3 LF (GAUZE/BANDAGES/DRESSINGS) ×1 IMPLANT
BNDG ELASTIC 3INX 5YD STR LF (GAUZE/BANDAGES/DRESSINGS) ×1 IMPLANT
BNDG ELASTIC 4INX 5YD STR LF (GAUZE/BANDAGES/DRESSINGS) ×1 IMPLANT
BNDG GAUZE DERMACEA FLUFF 4 (GAUZE/BANDAGES/DRESSINGS) ×1 IMPLANT
CHLORAPREP W/TINT 26 (MISCELLANEOUS) ×1 IMPLANT
CNTNR URN SCR LID CUP LEK RST (MISCELLANEOUS) ×1 IMPLANT
COVER BACK TABLE 60X90IN (DRAPES) ×1 IMPLANT
CUFF TOURN SGL QUICK 18X4 (TOURNIQUET CUFF) ×1 IMPLANT
CUFF TRNQT CYL 24X4X16.5-23 (TOURNIQUET CUFF) ×1 IMPLANT
DRAPE 3/4 80X56 (DRAPES) ×1 IMPLANT
DRAPE EXTREMITY T 121X128X90 (DISPOSABLE) ×1 IMPLANT
DRAPE SHEET LG 3/4 BI-LAMINATE (DRAPES) ×1 IMPLANT
DRAPE U-SHAPE 47X51 STRL (DRAPES) ×1 IMPLANT
DRSG EMULSION OIL 3X3 NADH (GAUZE/BANDAGES/DRESSINGS) IMPLANT
DRSG VAC GRANUFOAM SM (GAUZE/BANDAGES/DRESSINGS) IMPLANT
ELECT REM PT RETURN 15FT ADLT (MISCELLANEOUS) ×1 IMPLANT
GAUZE SPONGE 4X4 12PLY STRL (GAUZE/BANDAGES/DRESSINGS) ×1 IMPLANT
GAUZE XEROFORM 1X8 LF (GAUZE/BANDAGES/DRESSINGS) ×1 IMPLANT
GLOVE BIO SURGEON STRL SZ7.5 (GLOVE) ×1 IMPLANT
GLOVE BIOGEL PI IND STRL 7.5 (GLOVE) ×1 IMPLANT
GOWN STRL REUS W/ TWL XL LVL3 (GOWN DISPOSABLE) ×1 IMPLANT
KIT BASIN OR (CUSTOM PROCEDURE TRAY) ×1 IMPLANT
KIT TURNOVER KIT B (KITS) IMPLANT
MANIFOLD NEPTUNE II (INSTRUMENTS) ×1 IMPLANT
NDL HYPO 25X1 1.5 SAFETY (NEEDLE) ×1 IMPLANT
NEEDLE HYPO 25X1 1.5 SAFETY (NEEDLE) ×1 IMPLANT
NS IRRIG 1000ML POUR BTL (IV SOLUTION) ×1 IMPLANT
PADDING CAST ABS COTTON 4X4 ST (CAST SUPPLIES) ×1 IMPLANT
PENCIL SMOKE EVACUATOR (MISCELLANEOUS) IMPLANT
SET IRRIG Y TYPE TUR BLADDER L (SET/KITS/TRAYS/PACK) ×1 IMPLANT
SPONGE T-LAP 4X18 ~~LOC~~+RFID (SPONGE) ×1 IMPLANT
STAPLER SKIN PROX WIDE 3.9 (STAPLE) ×1 IMPLANT
STOCKINETTE 6 STRL (DRAPES) ×1 IMPLANT
STRIP CLOSURE SKIN 1/2X4 (GAUZE/BANDAGES/DRESSINGS) IMPLANT
SUCTION TUBE FRAZIER 10FR DISP (SUCTIONS) ×1 IMPLANT
SUT ETHILON 3 0 PS 1 (SUTURE) ×1 IMPLANT
SUT ETHILON 4 0 PS 2 18 (SUTURE) ×1 IMPLANT
SUT MNCRL AB 3-0 PS2 18 (SUTURE) ×1 IMPLANT
SUT MNCRL AB 4-0 PS2 18 (SUTURE) ×1 IMPLANT
SUT VIC AB 2-0 SH 27XBRD (SUTURE) ×1 IMPLANT
SYR BULB EAR ULCER 3OZ GRN STR (SYRINGE) ×1 IMPLANT
SYR CONTROL 10ML LL (SYRINGE) ×1 IMPLANT
TUBE CONNECTING 12X1/4 (SUCTIONS) ×1 IMPLANT
TUBE IRRIGATION SET MISONIX (TUBING) ×1 IMPLANT
UNDERPAD 30X36 HEAVY ABSORB (UNDERPADS AND DIAPERS) ×1 IMPLANT
YANKAUER SUCT BULB TIP NO VENT (SUCTIONS) ×1 IMPLANT

## 2024-04-17 NOTE — H&P (Signed)
 SURGICAL HISTORY and PHYSICAL FOOT & ANKLE SURGERY    Date Time: 04/17/24 11:26 AM Physician: Malvin DPM   History of Presenting Illness: Dakota Becker is a 48 y.o. year old male presenting to [facility] hospital for surgery on left foot today. The patient complains of chronic wound that has been present for months since prior ray amputations, and has failed to heal with standard wound care and wound vac. Conservative management was attempted including standard wound care and vac, but was ultimately unsuccessful. The patient has elected to pursue surgical management and understands the procedure, benefits, risks/complications, and alternatives to surgery. He has remained npo since [place]. Otherwise feeling well today, no other complaints. Denies fever, chills, nausea, emesis, dyspnea, and chest pain.  Past Medical History: Past Medical History:  Diagnosis Date   Deep vein thrombosis (DVT) (HCC) 2025   left lower extremity   Diabetes mellitus without complication (HCC)    Hyperlipidemia    Hypertension     Past Surgical History: Past Surgical History:  Procedure Laterality Date   AMPUTATION Left 01/27/2024   Procedure: Partial first ray amp, gas gangrene;  Surgeon: Malvin Marsa FALCON, DPM;  Location: [facility] OR;  Service: Orthopedics/Podiatry;  Laterality: Left;  Partial first ray amputation, gas gangrene with Antibiotic Bead Placement   AMPUTATION TOE Left 01/27/2024   Procedure: AMPUTATION 2nd Toe;  Surgeon: Malvin Marsa FALCON, DPM;  Location: [facility] OR;  Service: Orthopedics/Podiatry;  Laterality: Left;   APPENDECTOMY     INCISION AND DRAINAGE OF WOUND Left 01/29/2024   Procedure: Left foot I&D, integra graft, vac app;  Surgeon: Malvin Marsa FALCON, DPM;  Location: [facility] OR;  Service: Orthopedics/Podiatry;  Laterality: Left;  Left foot I&D, integra graft, vac app   TRANSESOPHAGEAL ECHOCARDIOGRAM (CATH LAB) N/A 02/02/2024   Procedure: TRANSESOPHAGEAL ECHOCARDIOGRAM;  Surgeon: Raford Riggs,  MD;  [facility] INVASIVE CV LAB;  Service: Cardiovascular;  Laterality: N/A;    Allergies: Allergies  Allergen Reactions   Lactose Intolerance (Gi) Other (See Comments)    GI upset, gas    Home Medications: Current Facility-Administered Medications  Medication Dose Route Frequency Provider Last Rate Last Admin   chlorhexidine  (PERIDEX ) 0.12 % solution 15 mL  15 mL Mouth/Throat Once Leopoldo Bruckner, MD       Or   Oral care mouth rinse  15 mL Mouth Rinse Once Leopoldo Bruckner, MD       insulin  aspart (novoLOG ) injection 0-14 Units  0-14 Units Subcutaneous Q2H PRN Corinne Garnette BRAVO, MD   2 Units at 04/17/24 1100   lactated ringers  infusion   Intravenous Continuous Leopoldo Bruckner, MD       Medications Prior to Admission  Medication Sig Dispense Refill Last Dose/Taking   gabapentin  (NEURONTIN ) 300 MG capsule Take 300 mg by mouth 3 (three) times daily.   04/17/2024 at  5:00 AM   insulin  aspart (NOVOLOG ) 100 UNIT/ML FlexPen Inject 10 Units into the skin 3 (three) times daily with meals. 15 mL 1 04/16/2024   insulin  glargine (LANTUS ) 100 UNIT/ML Solostar Pen Inject 30 Units into the skin daily. 15 mL 1 04/17/2024 at  5:00 AM   lisinopril  (ZESTRIL ) 20 MG tablet Take 20 mg by mouth daily.   04/17/2024 at  5:00 AM   simvastatin (ZOCOR) 20 MG tablet Take 20 mg by mouth daily.   04/16/2024   apixaban  (ELIQUIS ) 5 MG TABS tablet Take 1 tablet (5 mg total) by mouth 2 (two) times daily. 180 tablet 0 04/14/2024   Blood Glucose Monitoring  Suppl (BLOOD GLUCOSE MONITOR SYSTEM) w/Device KIT Use in the morning, at noon, and at bedtime. 1 kit 0      Social History: @IPSOCHX @  Family History: Family History  Problem Relation Age of Onset   Diabetes Mother    Diabetes Father     Review of Systems: As per HPI. A complete 10 point review of systems was performed and all other systems reviewed were negative.  Physical Exam: Vitals:   04/17/24 1018  BP: 137/88  Pulse: 93  Resp: 18  Temp: 98.3 F  (36.8 C)  SpO2: 100%    Lower Extremity Physical Exam  Vasc: Bleeds well on prior debridement Neuro: Sensation absent to light touch MSK: prior left partial 1st and 2nd ray amputation Derm: Dressing C/D/I to left foot Lungs: Unlabored, no cough/wheezing Heart: RRR     Labs:  - Reviewed prior to examination  Radiology:  - Reviewed prior to examination  Assessment: Dakota Becker is 48 y.o. male with osteomyelitis of left foot s/p left partial first and second ray amputation with chornic wound presenting for planned repeat debridement and graft application, possible wound vac application.    Plan: - Will proceed with surgical management as above, NPO. - Procedure, benefits, risks, and alternatives discussed with patient and informed consent obtained. - Prophylactic antibiotics: will hold until after culture taken in OR. - Surgical site marked.  Marsa FALCON Nahjae Hoeg

## 2024-04-17 NOTE — Discharge Instructions (Signed)
Foot & Ankle Surgery Patient Discharge Instructions:   Arrange to have an adult drive you home after surgery. If you had general anesthesia, it may take a day or more to fully recover. So, for at least the next 24 hours: Do not drive or use machinery or power tools; do not drink alcohol; and do not make any major decisions.   Bandages: Keep your dressings clean, dry, and intact. Do not remove or change. When bathing/showering, cover the bandage or cast with a plastic bag to keep it dry (still keep the area away from the water) and use a chair, do not stand. Don't remove your bandage until your doctor tells you to. If your bandage gets wet or dirty, check with your doctor. You can likely replace it with a clean, dry one.  Activity: Weightbearing as tolerated in post op shoe Sit or lie down when possible. Put a pillow under your heel to raise your foot above the level of your heart. Place ice bag or pack behind knee on surgical side for 20 minutes every hour that you are awake for the first 24-48 hours. You can drive again when instructed by your doctor. Wear your surgical shoe at all times unless told otherwise by your health care provider. Use crutches or a cane as directed. Follow your doctor's instructions about putting weight on your foot.  Diet: Start with liquids and light foods (such as dry toast, bananas, and applesauce). As you feel up to it, slowly return to your normal diet. Drink at least six to eight glasses of water or other nonalcoholic fluids a day. To avoid nausea, eat before taking narcotic pain medications.  Medications: Take all medications as instructed. Take pain medications on time. Do not wait until the pain is bad before taking your medications. Avoid alcohol while on pain medications.  Call your doctor if: Continuous bleeding through dressings. If your dressings get wet. Fevers over 100.4 degrees Fahrenheit (38 degrees Celsius). Chest pains or difficulty  breathing.

## 2024-04-17 NOTE — Anesthesia Postprocedure Evaluation (Signed)
 Anesthesia Post Note  Patient: Dakota Becker  Procedure(s) Performed: APPLICATION, ALLOGRAFT, SKIN (Left: Foot) APPLICATION, WOUND VAC (Left: Foot)     Patient location during evaluation: PACU Anesthesia Type: MAC Level of consciousness: awake and alert Pain management: pain level controlled Vital Signs Assessment: post-procedure vital signs reviewed and stable Respiratory status: spontaneous breathing, nonlabored ventilation and respiratory function stable Cardiovascular status: stable and blood pressure returned to baseline Postop Assessment: no apparent nausea or vomiting Anesthetic complications: no   No notable events documented.  Last Vitals:  Vitals:   04/17/24 1302 04/17/24 1315  BP: 110/69 109/70  Pulse: 82 79  Resp: 17 17  Temp: (!) 36.3 C 36.4 C  SpO2: 100% 98%    Last Pain:  Vitals:   04/17/24 1315  TempSrc:   PainSc: 0-No pain                 Garnette FORBES Skillern

## 2024-04-17 NOTE — Anesthesia Preprocedure Evaluation (Signed)
 Anesthesia Evaluation  Patient identified by MRN, date of birth, ID band Patient awake    Reviewed: Allergy & Precautions, NPO status , Patient's Chart, lab work & pertinent test results  Airway Mallampati: II  TM Distance: >3 FB Neck ROM: Full    Dental  (+) Teeth Intact, Dental Advisory Given   Pulmonary neg pulmonary ROS   Pulmonary exam normal breath sounds clear to auscultation       Cardiovascular hypertension, Pt. on medications (-) angina + DVT  (-) Past MI Normal cardiovascular exam Rhythm:Regular Rate:Normal     Neuro/Psych negative neurological ROS  negative psych ROS   GI/Hepatic Neg liver ROS,,,  Endo/Other  diabetes, Type 2, Insulin  Dependent  Obesity   Renal/GU negative Renal ROS     Musculoskeletal negative musculoskeletal ROS (+)    Abdominal   Peds  Hematology  (+) Blood dyscrasia (Eliquis )   Anesthesia Other Findings Day of surgery medications reviewed with the patient.  Reproductive/Obstetrics                              Anesthesia Physical Anesthesia Plan  ASA: 2  Anesthesia Plan: General   Post-op Pain Management: Tylenol  PO (pre-op)*   Induction: Intravenous  PONV Risk Score and Plan: 2 and Midazolam , Dexamethasone and Ondansetron   Airway Management Planned: LMA  Additional Equipment:   Intra-op Plan:   Post-operative Plan: Extubation in OR  Informed Consent: I have reviewed the patients History and Physical, chart, labs and discussed the procedure including the risks, benefits and alternatives for the proposed anesthesia with the patient or authorized representative who has indicated his/her understanding and acceptance.     Dental advisory given  Plan Discussed with: CRNA  Anesthesia Plan Comments:          Anesthesia Quick Evaluation

## 2024-04-17 NOTE — Transfer of Care (Signed)
 Immediate Anesthesia Transfer of Care Note  Patient: Dakota Becker  Procedure(s) Performed: APPLICATION, ALLOGRAFT, SKIN (Left: Foot) APPLICATION, WOUND VAC (Left: Foot)  Patient Location: PACU  Anesthesia Type:MAC  Level of Consciousness: awake, alert , and oriented  Airway & Oxygen Therapy: Patient Spontanous Breathing and Patient connected to face mask oxygen  Post-op Assessment: Report given to RN and Post -op Vital signs reviewed and stable  Post vital signs: Reviewed and stable  Last Vitals:  Vitals Value Taken Time  BP 110/69 04/17/24 13:02  Temp 36.3 C 04/17/24 13:02  Pulse 81 04/17/24 13:04  Resp 9 04/17/24 13:04  SpO2 100 % 04/17/24 13:04  Vitals shown include unfiled device data.  Last Pain:  Vitals:   04/17/24 1125  TempSrc:   PainSc: 0-No pain         Complications: No notable events documented.

## 2024-04-17 NOTE — Op Note (Signed)
 Full Operative Report  Date of Operation: 12:57 PM, 04/17/2024   Patient: Dakota Becker - 48 y.o. male  Surgeon: Malvin Marsa FALCON, DPM   Assistant: None  Diagnosis: Ulcer of left foot, with necrosis of bone  Procedure:  1.  Irrigation and excisional debridement of ulceration to the level of periosteum, 4.5 x 3 x 0.3 cm, left foot 2.  Application of amniotic graft, 4.5 x 3 x 0.3 cm, left foot 3.  Application of negative pressure wound therapy system, 4.5 x 3 x 0.3 cm, left foot    Anesthesia: Anesthesia type not filed in the log.  Corinne Garnette BRAVO, MD  Anesthesiologist: Corinne Garnette BRAVO, MD CRNA: Harrold Macintosh, CRNA; Dorma Jonny BROCKS, CRNA   Estimated Blood Loss: Minimal   Hemostasis: 1) Anatomical dissection, mechanical compression, electrocautery 2) no tourniquet was used on procedure  Implants: Implant Name Type Inv. Item Serial No. Manufacturer Lot No. LRB No. Used Action  ALLOGRAFT AMNI BIOVANCE 3X6 3L - [id number] Graft ALLOGRAFT AMNI BIOVANCE 3X6 3L [id number] [employer]  Left 1 Implanted    Materials: Steri-Strips  Injectables: 1) Pre-operatively: 10 cc of 0.5% marcaine  plain 2) Post-operatively: None   Specimens: - Pathology: None - Microbiology: None   Antibiotics: None   Drains: None  Complications: Patient tolerated the procedure well without complication.   Operative findings: As below in detailed report  Indications for Procedure: JALIEN WEAKLAND presents to Malvin Marsa FALCON, DPM with a chief complaint of open chronic wound on the left foot status post prior partial 1st and 2nd ray amputations.  Has been undergoing wound care now presenting for planned debridement of the ulceration with graft and VAC application.  He has been undergoing wound VAC therapy for several weeks at this point in time the patient has failed conservative treatments of various modalities. At this time the patient has elected to proceed with surgical correction. All  alternatives, risks, and complications of the procedures were thoroughly explained to the patient. Patient exhibits appropriate understanding of all discussion points and informed consent was signed and obtained in the chart with no guarantees to surgical outcome given or implied.  Description of Procedure: Patient was brought to the operating room. Patient remained on their hospital stretcher in the supine position . A surgical timeout was performed and all members of the operating room, the procedure, and the surgical site were identified. anesthesia occurred as per anesthesia record. Local anesthetic as previously described was then injected about the operative field in a local infiltrative block.  The operative lower extremity as noted above was then prepped and draped in the usual sterile manner. The following procedure then began.  Attention was directed to the ulceration present on the left medial midfoot at the TMT J area.  The ulceration was measured and found to be approximately 4.5 x 3 x 0.3 cm.  There was healthy granular tissue in the wound bed.  No evidence of necrotic or fibrotic tissues present. There were no signs of infection, no sinus tracts, or fibrotic tissues. The wound was then prepped for graft coverage. The wound margins were sharply curretaged and excisionally debrided to the level of periosteum though no bone was explicitly seen through the wound. The entire wound base thoroughly debrided with curettes and rongeurs to the level of healthy viable tissue. The wound was then irrigated with 1L of saline with bulb syringe. The wound measured 4.5 x 3 x 0.3 postdebridement. Hemostasis was achieved with electrocautery.   Next the  amniotic 3 layer bio Vance graft  was then placed within the wound bed, covering the entire wound.  The graft was well adhered to the wound.  No graft was wasted this was then affixed with Steri-Strips.  Next layer of Adaptic was placed overlying the graft.   Decision was then made to continue the wound VAC given the improvement we have seen with VAC therapy.  Wound VAC sponge was cut to the above measurements of 4.5 x 3 cm and black sponge was placed in the wound bed.  This was then secured with wound VAC drape.  The wound VAC was then attached to the patient's home VAC which she had from prior and was turned on 125 mm Hg continuous suction.  The surgical site was then dressed with 4 x 4 gauze Kerlix Ace wrap. The patient tolerated both the procedure and anesthesia well with vital signs stable throughout. The patient was transferred in good condition and all vital signs stable  from the OR to recovery under the discretion of anesthesia.  Condition: Vital signs stable, neurovascular status unchanged from preoperative   Surgical plan:  Wound appeared very healthy with healthy granular bleeding tissue upon debridement.  No evidence of infection present.  No cultures were harvested therefore.  Graft covered the entire wound and wound VAC was applied to continue assisted healing.  The patient will be weightbearing as tolerated in a postop shoe to the operative limb until further instructed. The dressing is to remain clean, dry, and intact. Will continue to follow unless noted elsewhere.   Marsa Honour, DPM [facility]

## 2024-04-18 ENCOUNTER — Encounter (HOSPITAL_COMMUNITY): Payer: Self-pay | Admitting: Podiatry

## 2024-04-18 DIAGNOSIS — T8140XA Infection following a procedure, unspecified, initial encounter: Secondary | ICD-10-CM | POA: Diagnosis not present

## 2024-04-18 DIAGNOSIS — S91302A Unspecified open wound, left foot, initial encounter: Secondary | ICD-10-CM | POA: Diagnosis not present

## 2024-04-19 DIAGNOSIS — T8140XA Infection following a procedure, unspecified, initial encounter: Secondary | ICD-10-CM | POA: Diagnosis not present

## 2024-04-19 DIAGNOSIS — S91302A Unspecified open wound, left foot, initial encounter: Secondary | ICD-10-CM | POA: Diagnosis not present

## 2024-04-20 DIAGNOSIS — S91302A Unspecified open wound, left foot, initial encounter: Secondary | ICD-10-CM | POA: Diagnosis not present

## 2024-04-20 DIAGNOSIS — T8140XA Infection following a procedure, unspecified, initial encounter: Secondary | ICD-10-CM | POA: Diagnosis not present

## 2024-04-21 DIAGNOSIS — S91302A Unspecified open wound, left foot, initial encounter: Secondary | ICD-10-CM | POA: Diagnosis not present

## 2024-04-21 DIAGNOSIS — T8140XA Infection following a procedure, unspecified, initial encounter: Secondary | ICD-10-CM | POA: Diagnosis not present

## 2024-04-22 DIAGNOSIS — S91302A Unspecified open wound, left foot, initial encounter: Secondary | ICD-10-CM | POA: Diagnosis not present

## 2024-04-22 DIAGNOSIS — T8140XA Infection following a procedure, unspecified, initial encounter: Secondary | ICD-10-CM | POA: Diagnosis not present

## 2024-04-23 ENCOUNTER — Ambulatory Visit (INDEPENDENT_AMBULATORY_CARE_PROVIDER_SITE_OTHER): Admitting: Podiatry

## 2024-04-23 DIAGNOSIS — T8140XA Infection following a procedure, unspecified, initial encounter: Secondary | ICD-10-CM | POA: Diagnosis not present

## 2024-04-23 DIAGNOSIS — S91302A Unspecified open wound, left foot, initial encounter: Secondary | ICD-10-CM | POA: Diagnosis not present

## 2024-04-23 DIAGNOSIS — Z9889 Other specified postprocedural states: Secondary | ICD-10-CM

## 2024-04-23 DIAGNOSIS — A480 Gas gangrene: Secondary | ICD-10-CM

## 2024-04-23 DIAGNOSIS — L97524 Non-pressure chronic ulcer of other part of left foot with necrosis of bone: Secondary | ICD-10-CM

## 2024-04-23 NOTE — Progress Notes (Signed)
 Subjective:  Patient ID: Dakota Becker, male    DOB: 11/10/1975,  MRN: [id number]  Chief Complaint  Patient presents with   Post-op Follow-up    Left foot. Wound vac in place. No pain. Some drainage onto the dressings from around the wound vac seal.     DOS: 01/27/24 Proc: 1. Partial first ray amputation of left foot 2. 2nd toe amputation at [facility] level of left foot 3. Application dissolvable antibiotic beads left foot  DOS: 01/29/24 1.  Repeat irrigation and debridement partial second metatarsal resection, left foot 2.  Dermal allograft application 5 x 4 cm, left foot 3.  Application negative pressure wound therapy 5 x 4 cm, left foot  DOS: 04/17/2024 1.  Irrigation and excisional debridement of ulceration to the level of periosteum, 4.5 x 3 x 0.3 cm, left foot 2.  Application of amniotic graft, 4.5 x 3 x 0.3 cm, left foot 3.  Application of negative pressure wound therapy system, 4.5 x 3 x 0.3 cm, left foot  48 y.o. male seen for post op check.  Now approximately 1 week  status post repeat irrigation debridement with application amniotic graft and wound VAC.  Dakota Becker reports Dakota Becker is doing well Dakota Becker is walking with postop shoe.  Has left the wound VAC intact since the surgery as instructed.  Dakota Becker denies any concerns.  Review of Systems: Negative except as noted in the HPI. Denies N/V/F/Ch.   Objective:   Constitutional Well developed. Well nourished.  Vascular Foot warm and well perfused. Capillary refill normal to all digits.   No calf pain with palpation  Neurologic Normal speech. Oriented to person, place, and time. Epicritic sensation absent to left foot  Dermatologic The wound on the left foot is much improved after graft and VAC application.  There is significant amount of infill improved from prior with healthy granular tissue in the wound bed after debridement of some overlying fibrotic tissue.  No evidence of infection     Orthopedic: Status post partial 1st and 2nd ray  amputation   Radiographs: 01/29/2024 repeat amputation of the first ray and out of the proximal metatarsal shaft and repeat amputation of the second ray and out of the proximal mid metatarsal shaft.  Pathology: 01/27/2024 A. FOOT, PARTIAL LEFT FIRST RAY AND 2ND TOE, AMPUTATION:  - Benign bone with mild acute inflammation and bacterial colonies  - Margin shows acute inflammation and necrosis, and appears partially  viable  - No malignancy identified   Micro: 01/27/2024 Enterococcus faecalis and group F strep  Assessment:   48 y.o. male with PMHx significant for DM2 with gas gangrene and osteomyelitis of left hallux and gangrene of 2nd toe with concern for underlying osteomyelitis   Now s/p partial 1st and 2nd ray amputation with graft and wound vac application.  S/p left foot repeat irrigation and debridement with prep for graft and amniotic graft and wound VAC application  Plan:  Patient was evaluated and treated and all questions answered.  1 weeks s/p repeat debridement grafting and VAC -Progressing As expected postoperatively -the wound is much improved with significant infill from prior after graft application- continue wound VAC therapy twice weekly -Debrided the wound of any necrotic or fibrotic tissues present in wound bed at this time to healthy bleeding base and silver alginate dressing followed by 4 x 4 Kerlix Ace was applied  -XR: Expected postop changes -WB Status: Heel weightbearing in postop shoe  -Sutures: None present -Medications/ABX: Continue antibiotics per ID recommendations   -  Dressing: Continue twice weekly wound VAC application.  Applied a silver alginate dressing today wound VAC will be replaced tomorrow - Follow-up in 2 weeks for wound check, will potentially be able to discontinue the wound VAC at that appointment           Marolyn JULIANNA Honour, DPM [facility] / [facility]

## 2024-04-24 ENCOUNTER — Ambulatory Visit: Admitting: Internal Medicine

## 2024-04-24 ENCOUNTER — Other Ambulatory Visit: Payer: Self-pay

## 2024-04-24 VITALS — BP 133/84 | HR 107 | Temp 98.1°F | Wt 259.0 lb

## 2024-04-24 DIAGNOSIS — M869 Osteomyelitis, unspecified: Secondary | ICD-10-CM

## 2024-04-24 DIAGNOSIS — T8140XA Infection following a procedure, unspecified, initial encounter: Secondary | ICD-10-CM | POA: Diagnosis not present

## 2024-04-24 DIAGNOSIS — S91302A Unspecified open wound, left foot, initial encounter: Secondary | ICD-10-CM | POA: Diagnosis not present

## 2024-04-24 NOTE — Progress Notes (Signed)
 Patient Active Problem List   Diagnosis Date Noted   Ulcer of left foot with necrosis of bone (HCC) 04/17/2024   Nausea 02/04/2024   Bacteremia 02/02/2024   Constipation 01/31/2024   Hypertension 01/31/2024   Acute deep vein thrombosis (DVT) of left lower extremity (HCC) 01/31/2024   Type 2 diabetes mellitus without complication, without long-term current use of insulin  (HCC) 01/31/2024   Toe osteomyelitis, left (HCC) 01/27/2024   Gas gangrene of foot (HCC) 01/27/2024   Gangrene of toe of left foot (HCC) 01/27/2024   Controlled type 2 diabetes mellitus without complication, without long-term current use of insulin  (HCC) 01/27/2024   Hypercalcemia 01/27/2024    Patient's Medications  New Prescriptions   No medications on file  Previous Medications   APIXABAN  (ELIQUIS ) 5 MG TABS TABLET    Take 1 tablet (5 mg total) by mouth 2 (two) times daily.   BLOOD GLUCOSE MONITORING SUPPL (BLOOD GLUCOSE MONITOR SYSTEM) W/DEVICE KIT    Use in the morning, at noon, and at bedtime.   GABAPENTIN  (NEURONTIN ) 300 MG CAPSULE    Take 300 mg by mouth 3 (three) times daily.   INSULIN  ASPART (NOVOLOG ) 100 UNIT/ML FLEXPEN    Inject 10 Units into the skin 3 (three) times daily with meals.   INSULIN  GLARGINE (LANTUS ) 100 UNIT/ML SOLOSTAR PEN    Inject 30 Units into the skin daily.   LISINOPRIL  (ZESTRIL ) 20 MG TABLET    Take 20 mg by mouth daily.   SIMVASTATIN (ZOCOR) 20 MG TABLET    Take 20 mg by mouth daily.  Modified Medications   No medications on file  Discontinued Medications   No medications on file    Subjective: 48 year old male with past medical history of diabetes, hyperlipidemia hypertension presents for hospital follow-up of strep constellatus petrosectomy with left foot gas Klickman/osteomyelitis.  Patient presented for worsening wound left foot and chills.CT of left foot showed necrotizing fasciitis of great toe, emphysema osteomyelitis or osteonecrosis taken to the OR. Blood  cultures grew strep constellatus. He underwent first ray amputation second toe amputation at [facility] with podiatry. Or cultures growing E faecalis, group F strep, Prevotella species.  Taken to the OR again for repeat I&D and VAC placement.  TTE and TEE without vegetation.  Discharged on Augmentin  complete 6 weeks of antibiotics from or EOT 6/15 03/25/24 No issues abx. No missed doses. Seen by podiatry in the interim. Wound vac on . 04/24/24: states wound looks better. Healing well. Stopped abx at last visit.    Review of Systems: Review of Systems  All other systems reviewed and are negative.   Past Medical History:  Diagnosis Date   Deep vein thrombosis (DVT) (HCC) 2025   left lower extremity   Diabetes mellitus without complication (HCC)    Hyperlipidemia    Hypertension     Social History   Tobacco Use   Smoking status: Never   Smokeless tobacco: Never  Vaping Use   Vaping status: Never Used  Substance Use Topics   Alcohol use: Not Currently    Alcohol/week: 0.0 - 2.0 standard drinks of alcohol   Drug use: No    Family History  Problem Relation Age of Onset   Diabetes Mother    Diabetes Father     Allergies  Allergen Reactions   Lactose Intolerance (Gi) Other (See Comments)    GI upset, gas    Health Maintenance  Topic Date Due   FOOT EXAM  Never done   OPHTHALMOLOGY EXAM  Never done   Diabetic kidney evaluation - Urine ACR  Never done   Hepatitis C Screening  Never done   DTaP/Tdap/Td (1 - Tdap) Never done   Pneumococcal Vaccine 58-76 Years old (1 of 2 - PCV) Never done   Hepatitis B Vaccines (1 of 3 - 19+ 3-dose series) Never done   Colonoscopy  Never done   COVID-19 Vaccine (5 - 2024-25 season) 05/28/2023   INFLUENZA VACCINE  04/26/2024   HEMOGLOBIN A1C  10/13/2024   Diabetic kidney evaluation - eGFR measurement  04/12/2025   HIV Screening  Completed   HPV VACCINES  Aged Out   Meningococcal B Vaccine  Aged Out    Objective:  There were no vitals filed  for this visit. There is no height or weight on file to calculate BMI.  Physical Exam Constitutional:      General: He is not in acute distress.    Appearance: He is normal weight. He is not toxic-appearing.  HENT:     Head: Normocephalic and atraumatic.     Right Ear: External ear normal.     Left Ear: External ear normal.     Nose: No congestion or rhinorrhea.     Mouth/Throat:     Mouth: Mucous membranes are moist.     Pharynx: Oropharynx is clear.  Eyes:     Extraocular Movements: Extraocular movements intact.     Conjunctiva/sclera: Conjunctivae normal.     Pupils: Pupils are equal, round, and reactive to light.  Cardiovascular:     Rate and Rhythm: Normal rate and regular rhythm.     Heart sounds: No murmur heard.    No friction rub. No gallop.  Pulmonary:     Effort: Pulmonary effort is normal.     Breath sounds: Normal breath sounds.  Abdominal:     General: Abdomen is flat. Bowel sounds are normal.     Palpations: Abdomen is soft.  Musculoskeletal:        General: No swelling.     Cervical back: Normal range of motion and neck supple.  Skin:    General: Skin is warm and dry.  Neurological:     General: No focal deficit present.     Mental Status: He is oriented to person, place, and time.  Psychiatric:        Mood and Affect: Mood normal.    Lab Results Lab Results  Component Value Date   WBC 7.2 04/12/2024   HGB 14.4 04/12/2024   HCT 44.7 04/12/2024   MCV 90.1 04/12/2024   PLT 360 04/12/2024    Lab Results  Component Value Date   CREATININE 0.91 04/12/2024   BUN 17 04/12/2024   NA 137 04/12/2024   K 5.0 04/12/2024   CL 103 04/12/2024   CO2 25 04/12/2024    Lab Results  Component Value Date   ALT 9 02/27/2024   AST 10 02/27/2024   ALKPHOS 60 02/08/2024   BILITOT 0.4 02/27/2024    Lab Results  Component Value Date   CHOL (H) 02/22/2008    231        ATP III CLASSIFICATION:  <200     mg/dL   Desirable  799-760  mg/dL   Borderline High   >=759    mg/dL   High   HDL 16 (L) 94/70/7990   LDLCALC (H) 02/22/2008    160        Total Cholesterol/HDL:CHD Risk Coronary Heart  Disease Risk Table                     Men   Women  1/2 Average Risk   3.4   3.3   TRIG 273 (H) 02/22/2008   CHOLHDL 14.4 02/22/2008   No results found for: LABRPR, RPRTITER No results found for: HIV1RNAQUANT, HIV1RNAVL, CD4TABS   Problem List Items Addressed This Visit   None  Results   Assessment/Plan #Strep constellatus bacteremia secondary to left foot abscess/osteomyelitis #Status post second toe amputation with cultures growing E faecalis, group F strep, Prevotella species - Patient was admitted for worsening left foot wound.  Blood cultures on admission grew strep constellatus.  Or cultures as above.  Workup included TTE and TEE without vegetation.  He was taken back to the OR on 5/5 for another I&D discharged on Augmentin  x 6 weeks eot 6/15(pt still taking) -labs 6/3 stable. Seen by podiatry on 6/10 progressing as expected. May need another debridement - stopped antibiotics at last visit. Pt underwent Patient underwent I&D of ulceration however periosteum with podiatry on 7/23.  Noted that wound appeared very healthy and granular bleeding tissue upon debridement.  No evidence of infection.  No cultures obtained, VAC was placed.  He was seen in follow-up with podiatry Dr. Malvin on 7/29 noted that he was progressing as expected and that the wound is much improved with significant inflow.  VAC therapy twice a week. -Reviewed image in chart of wound yesterday, plan on vac palcement tomorrow. He has wet to dry dressin gon .  - Will do labs off of antibiotics follow-up with ID as needed #Diabetes mellitus - Needs glycemic control for optimal wound healing  Loney Stank, MD [facility] 04/24/2024, 11:17 AM   I have personally spent 35 minutes involved in face-to-face and  non-face-to-face activities for this patient on the day of the visit. Professional time spent includes the following activities: Preparing to see the patient (review of tests), Obtaining and/or reviewing separately obtained history (admission/discharge record), Performing a medically appropriate examination and/or evaluation , Ordering medications/tests/procedures, referring and communicating with other health care professionals, Documenting clinical information in the EMR, Independently interpreting results (not separately reported), Communicating results to the patient/family/caregiver, Counseling and educating the patient/family/caregiver and Care coordination (not separately reported).

## 2024-04-25 DIAGNOSIS — T8140XA Infection following a procedure, unspecified, initial encounter: Secondary | ICD-10-CM | POA: Diagnosis not present

## 2024-04-25 DIAGNOSIS — S91302A Unspecified open wound, left foot, initial encounter: Secondary | ICD-10-CM | POA: Diagnosis not present

## 2024-04-25 LAB — C-REACTIVE PROTEIN: CRP: 5.7 mg/L (ref <8.0)

## 2024-04-25 LAB — SEDIMENTATION RATE: Sed Rate: 9 mm/h (ref 0–15)

## 2024-04-26 DIAGNOSIS — I10 Essential (primary) hypertension: Secondary | ICD-10-CM | POA: Diagnosis not present

## 2024-04-26 DIAGNOSIS — E669 Obesity, unspecified: Secondary | ICD-10-CM | POA: Diagnosis not present

## 2024-04-26 DIAGNOSIS — E11621 Type 2 diabetes mellitus with foot ulcer: Secondary | ICD-10-CM | POA: Diagnosis not present

## 2024-04-26 DIAGNOSIS — Z794 Long term (current) use of insulin: Secondary | ICD-10-CM | POA: Diagnosis not present

## 2024-04-26 DIAGNOSIS — E1151 Type 2 diabetes mellitus with diabetic peripheral angiopathy without gangrene: Secondary | ICD-10-CM | POA: Diagnosis not present

## 2024-04-26 DIAGNOSIS — Z7901 Long term (current) use of anticoagulants: Secondary | ICD-10-CM | POA: Diagnosis not present

## 2024-04-26 DIAGNOSIS — T8140XA Infection following a procedure, unspecified, initial encounter: Secondary | ICD-10-CM | POA: Diagnosis not present

## 2024-04-26 DIAGNOSIS — M869 Osteomyelitis, unspecified: Secondary | ICD-10-CM | POA: Diagnosis not present

## 2024-04-26 DIAGNOSIS — L97522 Non-pressure chronic ulcer of other part of left foot with fat layer exposed: Secondary | ICD-10-CM | POA: Diagnosis not present

## 2024-04-26 DIAGNOSIS — Z89422 Acquired absence of other left toe(s): Secondary | ICD-10-CM | POA: Diagnosis not present

## 2024-04-26 DIAGNOSIS — S91302A Unspecified open wound, left foot, initial encounter: Secondary | ICD-10-CM | POA: Diagnosis not present

## 2024-04-26 DIAGNOSIS — A409 Streptococcal sepsis, unspecified: Secondary | ICD-10-CM | POA: Diagnosis not present

## 2024-04-26 DIAGNOSIS — E1169 Type 2 diabetes mellitus with other specified complication: Secondary | ICD-10-CM | POA: Diagnosis not present

## 2024-04-26 DIAGNOSIS — E785 Hyperlipidemia, unspecified: Secondary | ICD-10-CM | POA: Diagnosis not present

## 2024-04-26 DIAGNOSIS — Z86718 Personal history of other venous thrombosis and embolism: Secondary | ICD-10-CM | POA: Diagnosis not present

## 2024-04-26 DIAGNOSIS — Z792 Long term (current) use of antibiotics: Secondary | ICD-10-CM | POA: Diagnosis not present

## 2024-04-26 DIAGNOSIS — Z6828 Body mass index (BMI) 28.0-28.9, adult: Secondary | ICD-10-CM | POA: Diagnosis not present

## 2024-04-27 DIAGNOSIS — S91302A Unspecified open wound, left foot, initial encounter: Secondary | ICD-10-CM | POA: Diagnosis not present

## 2024-04-27 DIAGNOSIS — T8140XA Infection following a procedure, unspecified, initial encounter: Secondary | ICD-10-CM | POA: Diagnosis not present

## 2024-04-28 DIAGNOSIS — S91302A Unspecified open wound, left foot, initial encounter: Secondary | ICD-10-CM | POA: Diagnosis not present

## 2024-04-28 DIAGNOSIS — T8140XA Infection following a procedure, unspecified, initial encounter: Secondary | ICD-10-CM | POA: Diagnosis not present

## 2024-04-29 DIAGNOSIS — T8140XA Infection following a procedure, unspecified, initial encounter: Secondary | ICD-10-CM | POA: Diagnosis not present

## 2024-04-29 DIAGNOSIS — S91302A Unspecified open wound, left foot, initial encounter: Secondary | ICD-10-CM | POA: Diagnosis not present

## 2024-04-30 DIAGNOSIS — E11621 Type 2 diabetes mellitus with foot ulcer: Secondary | ICD-10-CM | POA: Diagnosis not present

## 2024-04-30 DIAGNOSIS — E1169 Type 2 diabetes mellitus with other specified complication: Secondary | ICD-10-CM | POA: Diagnosis not present

## 2024-04-30 DIAGNOSIS — E785 Hyperlipidemia, unspecified: Secondary | ICD-10-CM | POA: Diagnosis not present

## 2024-04-30 DIAGNOSIS — M869 Osteomyelitis, unspecified: Secondary | ICD-10-CM | POA: Diagnosis not present

## 2024-04-30 DIAGNOSIS — L97522 Non-pressure chronic ulcer of other part of left foot with fat layer exposed: Secondary | ICD-10-CM | POA: Diagnosis not present

## 2024-04-30 DIAGNOSIS — S91302A Unspecified open wound, left foot, initial encounter: Secondary | ICD-10-CM | POA: Diagnosis not present

## 2024-04-30 DIAGNOSIS — T8140XA Infection following a procedure, unspecified, initial encounter: Secondary | ICD-10-CM | POA: Diagnosis not present

## 2024-04-30 DIAGNOSIS — I10 Essential (primary) hypertension: Secondary | ICD-10-CM | POA: Diagnosis not present

## 2024-04-30 DIAGNOSIS — Z89422 Acquired absence of other left toe(s): Secondary | ICD-10-CM | POA: Diagnosis not present

## 2024-04-30 DIAGNOSIS — E669 Obesity, unspecified: Secondary | ICD-10-CM | POA: Diagnosis not present

## 2024-04-30 DIAGNOSIS — Z794 Long term (current) use of insulin: Secondary | ICD-10-CM | POA: Diagnosis not present

## 2024-04-30 DIAGNOSIS — A409 Streptococcal sepsis, unspecified: Secondary | ICD-10-CM | POA: Diagnosis not present

## 2024-04-30 DIAGNOSIS — Z86718 Personal history of other venous thrombosis and embolism: Secondary | ICD-10-CM | POA: Diagnosis not present

## 2024-04-30 DIAGNOSIS — E1151 Type 2 diabetes mellitus with diabetic peripheral angiopathy without gangrene: Secondary | ICD-10-CM | POA: Diagnosis not present

## 2024-04-30 DIAGNOSIS — Z7901 Long term (current) use of anticoagulants: Secondary | ICD-10-CM | POA: Diagnosis not present

## 2024-04-30 DIAGNOSIS — Z6828 Body mass index (BMI) 28.0-28.9, adult: Secondary | ICD-10-CM | POA: Diagnosis not present

## 2024-04-30 DIAGNOSIS — Z792 Long term (current) use of antibiotics: Secondary | ICD-10-CM | POA: Diagnosis not present

## 2024-05-01 ENCOUNTER — Encounter: Payer: Self-pay | Admitting: Podiatry

## 2024-05-01 ENCOUNTER — Ambulatory Visit: Admitting: Podiatry

## 2024-05-01 VITALS — Ht 72.0 in | Wt 259.0 lb

## 2024-05-01 DIAGNOSIS — L97524 Non-pressure chronic ulcer of other part of left foot with necrosis of bone: Secondary | ICD-10-CM

## 2024-05-01 DIAGNOSIS — S91302A Unspecified open wound, left foot, initial encounter: Secondary | ICD-10-CM | POA: Diagnosis not present

## 2024-05-01 DIAGNOSIS — T8140XA Infection following a procedure, unspecified, initial encounter: Secondary | ICD-10-CM | POA: Diagnosis not present

## 2024-05-02 DIAGNOSIS — T8140XA Infection following a procedure, unspecified, initial encounter: Secondary | ICD-10-CM | POA: Diagnosis not present

## 2024-05-02 DIAGNOSIS — S91302A Unspecified open wound, left foot, initial encounter: Secondary | ICD-10-CM | POA: Diagnosis not present

## 2024-05-02 NOTE — Progress Notes (Signed)
 Subjective:   Patient ID: Dakota Becker, male   DOB: 48 y.o.   MRN: [id number]   HPI Patient was concerned about left lateral foot that he had a break in skin with patient having had history of amputation of the left hallux second toe and is currently utilizing a wound VAC for healing on that side.  States that he thinks the caregiver may have irritated the tissue with dressings   ROS      Objective:  Physical Exam  Neurovascular has not changed from previous with the patient having a wound VAC on the medial side of her foot where the surgery was done.  On the lateral side base of fifth metatarsal there is a abrasion of tissue measuring about 1 cm x 1 cm localized with no proximal edema erythema drainage noted and no subcutaneous bone or tendon exposure     Assessment:  Abrasion of tissue on the lateral side left foot that appears to be very localized and more due to dressing than anything else     Plan:  Reviewed condition applied sterile dressing to the area and advised on the importance of using the proper dressings which the caregiver is doing.  This should be uneventful and patient will see Dr. Malvin on Tuesday or earlier if any issues were to occur or changes

## 2024-05-03 DIAGNOSIS — E11621 Type 2 diabetes mellitus with foot ulcer: Secondary | ICD-10-CM | POA: Diagnosis not present

## 2024-05-03 DIAGNOSIS — E669 Obesity, unspecified: Secondary | ICD-10-CM | POA: Diagnosis not present

## 2024-05-03 DIAGNOSIS — Z7901 Long term (current) use of anticoagulants: Secondary | ICD-10-CM | POA: Diagnosis not present

## 2024-05-03 DIAGNOSIS — Z792 Long term (current) use of antibiotics: Secondary | ICD-10-CM | POA: Diagnosis not present

## 2024-05-03 DIAGNOSIS — I10 Essential (primary) hypertension: Secondary | ICD-10-CM | POA: Diagnosis not present

## 2024-05-03 DIAGNOSIS — Z86718 Personal history of other venous thrombosis and embolism: Secondary | ICD-10-CM | POA: Diagnosis not present

## 2024-05-03 DIAGNOSIS — E1151 Type 2 diabetes mellitus with diabetic peripheral angiopathy without gangrene: Secondary | ICD-10-CM | POA: Diagnosis not present

## 2024-05-03 DIAGNOSIS — T8140XA Infection following a procedure, unspecified, initial encounter: Secondary | ICD-10-CM | POA: Diagnosis not present

## 2024-05-03 DIAGNOSIS — M869 Osteomyelitis, unspecified: Secondary | ICD-10-CM | POA: Diagnosis not present

## 2024-05-03 DIAGNOSIS — Z794 Long term (current) use of insulin: Secondary | ICD-10-CM | POA: Diagnosis not present

## 2024-05-03 DIAGNOSIS — E1169 Type 2 diabetes mellitus with other specified complication: Secondary | ICD-10-CM | POA: Diagnosis not present

## 2024-05-03 DIAGNOSIS — L97522 Non-pressure chronic ulcer of other part of left foot with fat layer exposed: Secondary | ICD-10-CM | POA: Diagnosis not present

## 2024-05-03 DIAGNOSIS — Z6828 Body mass index (BMI) 28.0-28.9, adult: Secondary | ICD-10-CM | POA: Diagnosis not present

## 2024-05-03 DIAGNOSIS — Z89422 Acquired absence of other left toe(s): Secondary | ICD-10-CM | POA: Diagnosis not present

## 2024-05-03 DIAGNOSIS — E785 Hyperlipidemia, unspecified: Secondary | ICD-10-CM | POA: Diagnosis not present

## 2024-05-03 DIAGNOSIS — S91302A Unspecified open wound, left foot, initial encounter: Secondary | ICD-10-CM | POA: Diagnosis not present

## 2024-05-03 DIAGNOSIS — A409 Streptococcal sepsis, unspecified: Secondary | ICD-10-CM | POA: Diagnosis not present

## 2024-05-04 DIAGNOSIS — S91302A Unspecified open wound, left foot, initial encounter: Secondary | ICD-10-CM | POA: Diagnosis not present

## 2024-05-04 DIAGNOSIS — T8140XA Infection following a procedure, unspecified, initial encounter: Secondary | ICD-10-CM | POA: Diagnosis not present

## 2024-05-05 DIAGNOSIS — S91302A Unspecified open wound, left foot, initial encounter: Secondary | ICD-10-CM | POA: Diagnosis not present

## 2024-05-05 DIAGNOSIS — T8140XA Infection following a procedure, unspecified, initial encounter: Secondary | ICD-10-CM | POA: Diagnosis not present

## 2024-05-06 DIAGNOSIS — T8140XA Infection following a procedure, unspecified, initial encounter: Secondary | ICD-10-CM | POA: Diagnosis not present

## 2024-05-06 DIAGNOSIS — S91302A Unspecified open wound, left foot, initial encounter: Secondary | ICD-10-CM | POA: Diagnosis not present

## 2024-05-07 ENCOUNTER — Ambulatory Visit (INDEPENDENT_AMBULATORY_CARE_PROVIDER_SITE_OTHER): Admitting: Podiatry

## 2024-05-07 DIAGNOSIS — Z9889 Other specified postprocedural states: Secondary | ICD-10-CM | POA: Diagnosis not present

## 2024-05-07 DIAGNOSIS — T8140XA Infection following a procedure, unspecified, initial encounter: Secondary | ICD-10-CM | POA: Diagnosis not present

## 2024-05-07 DIAGNOSIS — L97524 Non-pressure chronic ulcer of other part of left foot with necrosis of bone: Secondary | ICD-10-CM

## 2024-05-07 DIAGNOSIS — S91302A Unspecified open wound, left foot, initial encounter: Secondary | ICD-10-CM | POA: Diagnosis not present

## 2024-05-07 DIAGNOSIS — A480 Gas gangrene: Secondary | ICD-10-CM | POA: Diagnosis not present

## 2024-05-07 NOTE — Progress Notes (Signed)
 Subjective:  Patient ID: ZAFIR SCHAUER, male    DOB: 10/22/1975,  MRN: [id number]  Chief Complaint  Patient presents with   Post-op Follow-up    Seen last week due to drainage from the lateral side of foot, none since then. The medial side is still draining. No woundvac today, it has been discontinued.     DOS: 01/27/24 Proc: 1. Partial first ray amputation of left foot 2. 2nd toe amputation at [facility] level of left foot 3. Application dissolvable antibiotic beads left foot  DOS: 01/29/24 1.  Repeat irrigation and debridement partial second metatarsal resection, left foot 2.  Dermal allograft application 5 x 4 cm, left foot 3.  Application negative pressure wound therapy 5 x 4 cm, left foot  DOS: 04/17/2024 1.  Irrigation and excisional debridement of ulceration to the level of periosteum, 4.5 x 3 x 0.3 cm, left foot 2.  Application of amniotic graft, 4.5 x 3 x 0.3 cm, left foot 3.  Application of negative pressure wound therapy system, 4.5 x 3 x 0.3 cm, left foot  48 y.o. male seen for post op check.  Now approximately 2.5 week  status post repeat irrigation debridement with application amniotic graft and wound VAC.   Reports the wound has been doing well overall.  Wound on the outside of the foot and the back of the heel are dry and not draining.  Wound VAC was discontinued by home health.  He is unsure if they will be back out to help with dressings.  Walking in postop shoe.  Off antibiotics at this time  Review of Systems: Negative except as noted in the HPI. Denies N/V/F/Ch.   Objective:   Constitutional Well developed. Well nourished.  Vascular Foot warm and well perfused. Capillary refill normal to all digits.   No calf pain with palpation  Neurologic Normal speech. Oriented to person, place, and time. Epicritic sensation absent to left foot  Dermatologic The wound on the left foot is much improved after graft and VAC application.  Appears much smaller in terms of length and  width from prior there is healthy granular tissue in the wound bed.  No evidence of infection no malodor.    Attention directed to the lateral foot there is a necrotic appearing dry eschar with underlying ulceration.  This appears dry and stable without evidence of surrounding infection. Similar appearing necrotic ulceration possibly from the tight gauze wrapping at the posterior Achilles on the left ankle. Both appear dry and stable without evidence of infection        Orthopedic: Status post partial 1st and 2nd ray amputation   Radiographs: 01/29/2024 repeat amputation of the first ray and out of the proximal metatarsal shaft and repeat amputation of the second ray and out of the proximal mid metatarsal shaft.  Pathology: 01/27/2024 A. FOOT, PARTIAL LEFT FIRST RAY AND 2ND TOE, AMPUTATION:  - Benign bone with mild acute inflammation and bacterial colonies  - Margin shows acute inflammation and necrosis, and appears partially  viable  - No malignancy identified   Micro: 01/27/2024 Enterococcus faecalis and group F strep  Assessment:   48 y.o. male with PMHx significant for DM2 with gas gangrene and osteomyelitis of left hallux and gangrene of 2nd toe with concern for underlying osteomyelitis   Now s/p partial 1st and 2nd ray amputation with graft and wound vac application.  S/p left foot repeat irrigation and debridement with prep for graft and amniotic graft and wound VAC application  Plan:  Patient was evaluated and treated and all questions answered.  2.5 weeks s/p repeat debridement grafting and VAC -Progressing As expected postoperatively -the wound is much improved with significant infill from prior after graft application.  Discontinued wound VAC therapy which is appropriate at this time -Continue with local wound care including silver alginate dressing followed by 4 x 4 gauze Kerlix and Ace roll but do not wrap too tight as to cause wound lateral foot or posterior  ankle -Debrided the wound of any necrotic or fibrotic tissues present in wound bed at this time to healthy bleeding base and silver alginate dressing followed by 4 x 4 Kerlix Ace was applied  -XR: Expected postop changes -WB Status: Heel weightbearing in postop shoe  -Sutures: None present -Medications/ABX: Continue antibiotics per ID recommendations   -Dressing: Continue Monday Wednesday Friday dressing changes with silver alginate contact layer to the wound followed by 4 x 4 gauze Kerlix and Ace will - Follow-up in 2 weeks for debridement and wound check           Marolyn JULIANNA Honour, DPM [facility] / [facility]

## 2024-05-08 DIAGNOSIS — T8140XA Infection following a procedure, unspecified, initial encounter: Secondary | ICD-10-CM | POA: Diagnosis not present

## 2024-05-08 DIAGNOSIS — S91302A Unspecified open wound, left foot, initial encounter: Secondary | ICD-10-CM | POA: Diagnosis not present

## 2024-05-09 DIAGNOSIS — S91302A Unspecified open wound, left foot, initial encounter: Secondary | ICD-10-CM | POA: Diagnosis not present

## 2024-05-09 DIAGNOSIS — T8140XA Infection following a procedure, unspecified, initial encounter: Secondary | ICD-10-CM | POA: Diagnosis not present

## 2024-05-10 DIAGNOSIS — T8140XA Infection following a procedure, unspecified, initial encounter: Secondary | ICD-10-CM | POA: Diagnosis not present

## 2024-05-10 DIAGNOSIS — S91302A Unspecified open wound, left foot, initial encounter: Secondary | ICD-10-CM | POA: Diagnosis not present

## 2024-05-11 DIAGNOSIS — S91302A Unspecified open wound, left foot, initial encounter: Secondary | ICD-10-CM | POA: Diagnosis not present

## 2024-05-11 DIAGNOSIS — T8140XA Infection following a procedure, unspecified, initial encounter: Secondary | ICD-10-CM | POA: Diagnosis not present

## 2024-05-12 DIAGNOSIS — S91302A Unspecified open wound, left foot, initial encounter: Secondary | ICD-10-CM | POA: Diagnosis not present

## 2024-05-12 DIAGNOSIS — T8140XA Infection following a procedure, unspecified, initial encounter: Secondary | ICD-10-CM | POA: Diagnosis not present

## 2024-05-13 DIAGNOSIS — T8140XA Infection following a procedure, unspecified, initial encounter: Secondary | ICD-10-CM | POA: Diagnosis not present

## 2024-05-13 DIAGNOSIS — S91302A Unspecified open wound, left foot, initial encounter: Secondary | ICD-10-CM | POA: Diagnosis not present

## 2024-05-21 ENCOUNTER — Encounter: Payer: Self-pay | Admitting: Podiatry

## 2024-05-21 ENCOUNTER — Ambulatory Visit: Admitting: Podiatry

## 2024-05-21 DIAGNOSIS — L97524 Non-pressure chronic ulcer of other part of left foot with necrosis of bone: Secondary | ICD-10-CM

## 2024-05-21 NOTE — Progress Notes (Signed)
 Subjective:  Patient ID: Dakota Becker, male    DOB: 1976-01-01,  MRN: [id number]  Chief Complaint  Patient presents with   Routine Post Op     Partial first ray amputation of left foot Dorsal wound. 0 pain. IDDM A1C 7.2 Wearing surgical shoe.Treating with Silver nitrate pads.     DOS: 01/27/24 Proc: 1. Partial first ray amputation of left foot 2. 2nd toe amputation at MPJ level of left foot 3. Application dissolvable antibiotic beads left foot  DOS: 01/29/24 1.  Repeat irrigation and debridement partial second metatarsal resection, left foot 2.  Dermal allograft application 5 x 4 cm, left foot 3.  Application negative pressure wound therapy 5 x 4 cm, left foot  DOS: 04/17/2024 1.  Irrigation and excisional debridement of ulceration to the level of periosteum, 4.5 x 3 x 0.3 cm, left foot 2.  Application of amniotic graft, 4.5 x 3 x 0.3 cm, left foot 3.  Application of negative pressure wound therapy system, 4.5 x 3 x 0.3 cm, left foot  48 y.o. male seen for post op check.  Approximately 1 month status post debridement and application of amniotic graft.  He reports the wound is continuing to heal well has been doing silver alginate dressing changes daily basis with 4 x 4 gauze Kerlix and Ace roll.  Walking in postop shoe which has been going well increasing his mobility  Review of Systems: Negative except as noted in the HPI. Denies N/V/F/Ch.   Objective:   Constitutional Well developed. Well nourished.  Vascular Foot warm and well perfused. Capillary refill normal to all digits.   No calf pain with palpation  Neurologic Normal speech. Oriented to person, place, and time. Epicritic sensation absent to left foot  Dermatologic The wound on the left foot is much improved after graft and VAC application.  Wound continues to heal and with healthy granular tissue bed no deep extension.  Measures approximately 2.5 x 1.5 cm postdebridement      Attention directed to the lateral  foot there is a necrotic appearing dry eschar with underlying ulceration.  This appears dry and stable without evidence of surrounding infection. Similar appearing necrotic ulceration possibly from the tight gauze wrapping at the posterior Achilles on the left ankle. Both appear dry and stable without evidence of infection    Orthopedic: Status post partial 1st and 2nd ray amputation   Radiographs: 01/29/2024 repeat amputation of the first ray and out of the proximal metatarsal shaft and repeat amputation of the second ray and out of the proximal mid metatarsal shaft.  Pathology: 01/27/2024 A. FOOT, PARTIAL LEFT FIRST RAY AND 2ND TOE, AMPUTATION:  - Benign bone with mild acute inflammation and bacterial colonies  - Margin shows acute inflammation and necrosis, and appears partially  viable  - No malignancy identified   Micro: 01/27/2024 Enterococcus faecalis and group F strep  Assessment:   48 y.o. male with PMHx significant for DM2 with gas gangrene and osteomyelitis of left hallux and gangrene of 2nd toe with concern for underlying osteomyelitis   Now s/p partial 1st and 2nd ray amputation with graft and wound vac application.  S/p left foot repeat irrigation and debridement with prep for graft and amniotic graft and wound VAC application  Plan:  Patient was evaluated and treated and all questions answered.  For weeks s/p repeat debridement grafting and VAC -Progressing As expected postoperatively -the wound is much improved with significant infill from prior after graft application.   -Debrided  the wound of any necrotic or fibrotic tissues present in wound bed at this time to healthy bleeding base and silver alginate dressing followed by 4 x 4 Kerlix Ace was applied  -XR: Expected postop changes -WB Status: Heel weightbearing in postop shoe  -Sutures: None present -Medications/ABX: Continue antibiotics per ID recommendations   -Dressing: Continue Monday Wednesday Friday dressing  changes with silver alginate contact layer to the wound followed by 4 x 4 gauze Kerlix and Ace will - Follow-up in 3 weeks for debridement and wound check   Procedure: Excisional Debridement of Wound Rationale: Removal of non-viable soft tissue from the wound to promote healing.  Anesthesia: None required secondary to neuropathy Pre-Debridement Wound Measurements: 2.5 cm x 1.5 cm x 0.3 cm  Post-Debridement Wound Measurements: 2.5 cm x 1.5 cm x 0.3 cm  Type of Debridement: Excisional Tissue Removed: Non-viable soft tissue Depth of Debridement: Subcutaneous fat tissue Instrumentation: Tissue nipper Technique: Sharp excisional debridement to bleeding, viable wound base.  Dressing: Dry, sterile, compression dressing. Disposition: Patient tolerated procedure well. Patient to return in 3 week for follow-up.         Marolyn JULIANNA Honour, DPM [facility] / [facility]

## 2024-06-11 ENCOUNTER — Ambulatory Visit: Admitting: Podiatry

## 2024-06-11 DIAGNOSIS — L97522 Non-pressure chronic ulcer of other part of left foot with fat layer exposed: Secondary | ICD-10-CM

## 2024-06-11 DIAGNOSIS — E0843 Diabetes mellitus due to underlying condition with diabetic autonomic (poly)neuropathy: Secondary | ICD-10-CM

## 2024-06-11 DIAGNOSIS — Z9889 Other specified postprocedural states: Secondary | ICD-10-CM

## 2024-06-11 DIAGNOSIS — L97524 Non-pressure chronic ulcer of other part of left foot with necrosis of bone: Secondary | ICD-10-CM

## 2024-06-11 MED ORDER — AMOXICILLIN-POT CLAVULANATE 875-125 MG PO TABS: 1.0000 | ORAL_TABLET | Freq: Two times a day (BID) | ORAL | 0 refills | Status: AC

## 2024-06-11 MED ORDER — MUPIROCIN 2 % EX OINT: 1.0000 | TOPICAL_OINTMENT | Freq: Every day | CUTANEOUS | 0 refills | Status: AC

## 2024-06-11 NOTE — Progress Notes (Signed)
 Subjective:  Patient ID: Dakota Becker, male    DOB: 11/09/1975,  MRN: [id number]  Chief Complaint  Patient presents with   Routine Post Op    Partial first ray amputation of left foot ulcer and lateral foot ulcer. Wearing surgical shoe and silver alginate dressings. IDDM A1C 7.2    DOS: 01/27/24 Proc: 1. Partial first ray amputation of left foot 2. 2nd toe amputation at MPJ level of left foot 3. Application dissolvable antibiotic beads left foot  DOS: 01/29/24 1.  Repeat irrigation and debridement partial second metatarsal resection, left foot 2.  Dermal allograft application 5 x 4 cm, left foot 3.  Application negative pressure wound therapy 5 x 4 cm, left foot  DOS: 04/17/2024 1.  Irrigation and excisional debridement of ulceration to the level of periosteum, 4.5 x 3 x 0.3 cm, left foot 2.  Application of amniotic graft, 4.5 x 3 x 0.3 cm, left foot 3.  Application of negative pressure wound therapy system, 4.5 x 3 x 0.3 cm, left foot  48 y.o. male seen for post op check.  Patient reports he is doing a lot of walking this past weekend he noticed that the outside of his foot seem to worsen with increased moisture on the tissue and ulceration worsening.  He thinks that the wound on the inside of the foot is improving and healing very well.  Is concerned about the outside of the foot though.  He also reports a small wound in the back of his heel  Review of Systems: Negative except as noted in the HPI. Denies N/V/F/Ch.   Objective:   Constitutional Well developed. Well nourished.  Vascular Foot warm and well perfused. Capillary refill normal to all digits.   No calf pain with palpation  Neurologic Normal speech. Oriented to person, place, and time. Epicritic sensation absent to left foot  Dermatologic The wound on the left foot is much improved after graft and VAC application.  Wound continues to heal and with healthy granular tissue bed no deep extension.  Measures approximately  0.5 x 0.5 x 0.2 cm postdebridement   New ulceration lateral aspect fifth metatarsal base that is circular in nature with fibrotic and necrotic central eschar/tissue plug.  Mild malodor of this area.  No significant drainage or purulence seen.  Wound measures 2 x 1.5 x 0.3 cm postdebridement with healthy bleeding fat tissue in the wound bed following debridement    Superficial ulceration of the posterior aspect of the patient's Achilles that is superficial without evidence of infection no maceration erythema or drainage.    Orthopedic: Status post partial 1st and 2nd ray amputation   Radiographs: 01/29/2024 repeat amputation of the first ray and out of the proximal metatarsal shaft and repeat amputation of the second ray and out of the proximal mid metatarsal shaft.  Pathology: 01/27/2024 A. FOOT, PARTIAL LEFT FIRST RAY AND 2ND TOE, AMPUTATION:  - Benign bone with mild acute inflammation and bacterial colonies  - Margin shows acute inflammation and necrosis, and appears partially  viable  - No malignancy identified   Micro: 01/27/2024 Enterococcus faecalis and group F strep  Assessment:   48 y.o. male with PMHx significant for DM2 with gas gangrene and osteomyelitis of left hallux and gangrene of 2nd toe with concern for underlying osteomyelitis   Now s/p partial 1st and 2nd ray amputation with graft and wound vac application.  S/p left foot repeat irrigation and debridement with prep for graft and amniotic graft and wound  VAC application  Plan:  Patient was evaluated and treated and all questions answered.  For weeks s/p repeat debridement grafting and VAC -Progressing As expected postoperatively -the wound is much improved with significant infill from prior  -much smaller and nearly fully healed at this time -Debrided the wound of any necrotic or fibrotic tissues present in wound bed at this time to healthy bleeding base and dressed with mupirocin  ointment and sterile adhesive bandage  dressing -XR: Expected postop changes -WB Status: Heel weightbearing in postop shoe  -Sutures: None present -Medications/ABX: Continue antibiotics per ID recommendations   -Dressing: Continue with antibiotic ointment and Band-Aid style dressing to the medial foot wound which is now nearly fully healed - Follow-up in 3 weeks for debridement and wound check   # New ulceration to subcutaneous fat tissue level at the lateral aspect of the fifth metatarsal base on the left foot -We discussed the etiology and factors that are a part of the wound healing process.  We also discussed the risk of infection both soft tissue and osteomyelitis from open ulceration.  Discussed the risk of limb loss if this happens or worsens. -Debridement as below. -Dressed with silver alginate, DSD. -Continue home dressing changes daily with Betadine and silver alginate contact layer -Continue off-loading with surgical shoe. -Last antibiotics: E Rx for Augmentin  875-125 for 10 days -Imaging: Deferred at this visit  Procedure: Excisional Debridement of Wound Rationale: Removal of non-viable soft tissue from the wound to promote healing.  Anesthesia: None required secondary to neuropathy Pre-Debridement Wound Measurements: 2 cm x 1.5 cm x 0.3 cm  Post-Debridement Wound Measurements: 2 cm x 1.5 cm x 0.3 cm  Type of Debridement: Excisional Tissue Removed: Non-viable soft tissue Depth of Debridement: Subcutaneous fat tissue Instrumentation: Tissue nipper Technique: Sharp excisional debridement to bleeding, viable wound base.  Dressing: Dry, sterile, compression dressing. Disposition: Patient tolerated procedure well. Patient to return in 2 week for follow-up.         Marolyn JULIANNA Honour, DPM [facility] / [place]

## 2024-06-21 DIAGNOSIS — G629 Polyneuropathy, unspecified: Secondary | ICD-10-CM | POA: Diagnosis not present

## 2024-06-21 DIAGNOSIS — L089 Local infection of the skin and subcutaneous tissue, unspecified: Secondary | ICD-10-CM | POA: Diagnosis not present

## 2024-06-21 DIAGNOSIS — E1169 Type 2 diabetes mellitus with other specified complication: Secondary | ICD-10-CM | POA: Diagnosis not present

## 2024-06-21 DIAGNOSIS — I10 Essential (primary) hypertension: Secondary | ICD-10-CM | POA: Diagnosis not present

## 2024-06-27 ENCOUNTER — Ambulatory Visit (INDEPENDENT_AMBULATORY_CARE_PROVIDER_SITE_OTHER): Admitting: Podiatry

## 2024-06-27 ENCOUNTER — Ambulatory Visit (INDEPENDENT_AMBULATORY_CARE_PROVIDER_SITE_OTHER)

## 2024-06-27 ENCOUNTER — Ambulatory Visit: Admitting: Podiatry

## 2024-06-27 ENCOUNTER — Encounter: Payer: Self-pay | Admitting: Podiatry

## 2024-06-27 DIAGNOSIS — L97522 Non-pressure chronic ulcer of other part of left foot with fat layer exposed: Secondary | ICD-10-CM

## 2024-06-27 DIAGNOSIS — E0843 Diabetes mellitus due to underlying condition with diabetic autonomic (poly)neuropathy: Secondary | ICD-10-CM

## 2024-06-27 DIAGNOSIS — Z9889 Other specified postprocedural states: Secondary | ICD-10-CM

## 2024-06-27 DIAGNOSIS — L97524 Non-pressure chronic ulcer of other part of left foot with necrosis of bone: Secondary | ICD-10-CM

## 2024-06-27 MED ORDER — AMOXICILLIN-POT CLAVULANATE 875-125 MG PO TABS: 1.0000 | ORAL_TABLET | Freq: Two times a day (BID) | ORAL | 0 refills | Status: AC

## 2024-06-27 NOTE — Progress Notes (Signed)
 Subjective:  Patient ID: Dakota Becker, male    DOB: Jun 17, 1976,  MRN: [id number]  Chief Complaint  Patient presents with   Wound Check    2wk follow up partial 1st and 2nd ray amputation.0 pain. IDDM A1C 7.2. Using medi honey, calcium alginate dressing, neosporin to wounds.    DOS: 01/27/24 Proc: 1. Partial first ray amputation of left foot 2. 2nd toe amputation at MPJ level of left foot 3. Application dissolvable antibiotic beads left foot  DOS: 01/29/24 1.  Repeat irrigation and debridement partial second metatarsal resection, left foot 2.  Dermal allograft application 5 x 4 cm, left foot 3.  Application negative pressure wound therapy 5 x 4 cm, left foot  DOS: 04/17/2024 1.  Irrigation and excisional debridement of ulceration to the level of periosteum, 4.5 x 3 x 0.3 cm, left foot 2.  Application of amniotic graft, 4.5 x 3 x 0.3 cm, left foot 3.  Application of negative pressure wound therapy system, 4.5 x 3 x 0.3 cm, left foot  48 y.o. male seen for post op check.  Patient following up for wound on the medial ray as well as on the outside of the foot at the fifth metatarsal base.  He reports that he has been using Medihoney and silver alginate dressing on both wounds.  Reports they seem to be improving he has seen less drainage from the wound on the outside of the foot.  Review of Systems: Negative except as noted in the HPI. Denies N/V/F/Ch.   Objective:   Constitutional Well developed. Well nourished.  Vascular Foot warm and well perfused. Capillary refill normal to all digits.   No calf pain with palpation  Neurologic Normal speech. Oriented to person, place, and time. Epicritic sensation absent to left foot  Dermatologic The wound on the left foot is much improved after graft and VAC application.  Wound continues to heal and with healthy granular tissue bed no deep extension.  Measures approximately 0.5 x 0.5 x 0.2 cm postdebridement.  Debrided some hyperkeratotic tissue  surrounding the borders   ulceration lateral aspect fifth metatarsal base appears slightly worse today measures approximately 2 x 2 x 0.3 cm postdebridement.  There is fibrotic tissue in the wound bed does bleed well upon debridement.  No malodor negative probe to bone.      Superficial ulceration of the posterior aspect of the patient's Achilles that is superficial without evidence of infection no maceration erythema or drainage.  Healthy granular tissue bed improved from prior    Orthopedic: Status post partial 1st and 2nd ray amputation   Radiographs: 01/29/2024 repeat amputation of the first ray and out of the proximal metatarsal shaft and repeat amputation of the second ray and out of the proximal mid metatarsal shaft.  06/27/2024 XR 3 views AP lateral oblique of the left foot.  Attention directed to the fifth metatarsal base there is no obvious erosion or osteolysis there to suggest osteomyelitis.  There is soft tissue ulceration lateral to the area.  Pathology: 01/27/2024 A. FOOT, PARTIAL LEFT FIRST RAY AND 2ND TOE, AMPUTATION:  - Benign bone with mild acute inflammation and bacterial colonies  - Margin shows acute inflammation and necrosis, and appears partially  viable  - No malignancy identified   Micro: 01/27/2024 Enterococcus faecalis and group F strep  Assessment:   48 y.o. male with PMHx significant for DM2 with gas gangrene and osteomyelitis of left hallux and gangrene of 2nd toe with concern for underlying osteomyelitis  Now s/p partial 1st and 2nd ray amputation with graft and wound vac application.  S/p left foot repeat irrigation and debridement with prep for graft and amniotic graft and wound VAC application  Plan:  Patient was evaluated and treated and all questions answered.   s/p repeat debridement grafting and VAC -Progressing As expected postoperatively -the wound is much improved with significant infill from prior  -much smaller and nearly fully healed at  this time -Debrided the wound of any necrotic or fibrotic tissues present in wound bed at this time to healthy bleeding base and dressed with mupirocin  ointment and sterile adhesive bandage dressing -XR: Expected postop changes -WB Status: Heel weightbearing in postop shoe  -Sutures: None present -Medications/ABX: Continue antibiotics per ID recommendations   -Dressing: Continue with Medihoney or antibiotic ointment and silver alginate dressing - Follow-up in 3 weeks for debridement and wound check   # Ulceration to subcutaneous fat tissue level at the lateral aspect of the fifth metatarsal base on the left foot -We discussed the etiology and factors that are a part of the wound healing process.  We also discussed the risk of infection both soft tissue and osteomyelitis from open ulceration.  Discussed the risk of limb loss if this happens or worsens. -Debridement as below. -Dressed with silver alginate, DSD. -Continue home dressing changes daily with Betadine and silver alginate contact layer -Continue off-loading with surgical shoe. -Last antibiotics: Previously on Augmentin , will send another course of Augmentin  -Imaging: XR taken today  of the left foot, no obvious evidence of osteomyelitis so I am still concerned with the appearance of the wound.  Will order MRI of the left foot to further evaluate  Procedure: Excisional Debridement of Wound Rationale: Removal of non-viable soft tissue from the wound to promote healing.  Anesthesia: None required secondary to neuropathy Pre-Debridement Wound Measurements: 2 cm x 2 cm x 0.3 cm  Post-Debridement Wound Measurements: 2 cm x 2 cm x 0.3 cm  Type of Debridement: Excisional Tissue Removed: Non-viable soft tissue Depth of Debridement: Subcutaneous fat tissue Instrumentation: Tissue nipper Technique: Sharp excisional debridement to bleeding, viable wound base.  Dressing: Dry, sterile, compression dressing. Disposition: Patient tolerated  procedure well. Patient to return in 2 week for follow-up.         Marolyn JULIANNA Honour, DPM [facility] / [facility]

## 2024-07-08 ENCOUNTER — Ambulatory Visit
Admission: RE | Admit: 2024-07-08 | Discharge: 2024-07-08 | Disposition: A | Discharge status: Home / Self Care | Source: Ambulatory Visit | Attending: Podiatry | Admitting: Podiatry

## 2024-07-08 DIAGNOSIS — L97522 Non-pressure chronic ulcer of other part of left foot with fat layer exposed: Secondary | ICD-10-CM

## 2024-07-08 DIAGNOSIS — M86172 Other acute osteomyelitis, left ankle and foot: Secondary | ICD-10-CM | POA: Diagnosis not present

## 2024-07-15 ENCOUNTER — Other Ambulatory Visit: Payer: Self-pay | Admitting: Podiatry

## 2024-07-15 DIAGNOSIS — L97522 Non-pressure chronic ulcer of other part of left foot with fat layer exposed: Secondary | ICD-10-CM

## 2024-07-16 LAB — C-REACTIVE PROTEIN: CRP: 1 mg/L (ref 0–10)

## 2024-07-16 LAB — SEDIMENTATION RATE: Sed Rate: 2 mm/h (ref 0–30)
